# Patient Record
Sex: Female | Born: 1935 | Race: White | Hispanic: No | Smoking: Never smoker
Health system: Southern US, Community
[De-identification: ages and names within clinical notes are randomized; demographics above are authoritative.]

## PROBLEM LIST (undated history)

## (undated) DIAGNOSIS — I10 Essential (primary) hypertension: Secondary | ICD-10-CM

## (undated) DIAGNOSIS — F32A Depression, unspecified: Secondary | ICD-10-CM

## (undated) DIAGNOSIS — E079 Disorder of thyroid, unspecified: Secondary | ICD-10-CM

## (undated) DIAGNOSIS — M1990 Unspecified osteoarthritis, unspecified site: Secondary | ICD-10-CM

## (undated) DIAGNOSIS — R7303 Prediabetes: Secondary | ICD-10-CM

## (undated) DIAGNOSIS — F329 Major depressive disorder, single episode, unspecified: Secondary | ICD-10-CM

## (undated) DIAGNOSIS — F419 Anxiety disorder, unspecified: Secondary | ICD-10-CM

## (undated) DIAGNOSIS — I639 Cerebral infarction, unspecified: Secondary | ICD-10-CM

## (undated) HISTORY — PX: MULTIPLE TOOTH EXTRACTIONS: SHX2053

## (undated) HISTORY — PX: ABDOMINAL HYSTERECTOMY: SHX81

## (undated) HISTORY — PX: CHOLECYSTECTOMY: SHX55

## (undated) HISTORY — PX: ROTATOR CUFF REPAIR: SHX139

---
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
## 1998-05-15 ENCOUNTER — Emergency Department (HOSPITAL_COMMUNITY): Admission: EM | Admit: 1998-05-15 | Discharge: 1998-05-15 | Payer: Self-pay | Admitting: Emergency Medicine

## 1998-05-16 ENCOUNTER — Ambulatory Visit (HOSPITAL_COMMUNITY): Admission: RE | Admit: 1998-05-16 | Discharge: 1998-05-16 | Payer: Self-pay

## 1999-10-23 ENCOUNTER — Encounter: Payer: Self-pay | Admitting: General Surgery

## 1999-10-27 ENCOUNTER — Ambulatory Visit (HOSPITAL_COMMUNITY): Admission: RE | Admit: 1999-10-27 | Discharge: 1999-10-28 | Payer: Self-pay | Admitting: General Surgery

## 2003-09-06 ENCOUNTER — Emergency Department (HOSPITAL_COMMUNITY): Admission: EM | Admit: 2003-09-06 | Discharge: 2003-09-06 | Payer: Self-pay | Admitting: Emergency Medicine

## 2003-09-27 ENCOUNTER — Encounter: Admission: RE | Admit: 2003-09-27 | Discharge: 2003-09-27 | Payer: Self-pay | Admitting: Sports Medicine

## 2003-10-18 ENCOUNTER — Ambulatory Visit (HOSPITAL_COMMUNITY): Admission: RE | Admit: 2003-10-18 | Discharge: 2003-10-18 | Payer: Self-pay | Admitting: Orthopedic Surgery

## 2003-11-05 ENCOUNTER — Ambulatory Visit (HOSPITAL_COMMUNITY): Admission: RE | Admit: 2003-11-05 | Discharge: 2003-11-05 | Payer: Self-pay | Admitting: Orthopedic Surgery

## 2003-11-05 ENCOUNTER — Ambulatory Visit (HOSPITAL_BASED_OUTPATIENT_CLINIC_OR_DEPARTMENT_OTHER): Admission: RE | Admit: 2003-11-05 | Discharge: 2003-11-05 | Payer: Self-pay | Admitting: Orthopedic Surgery

## 2003-11-05 ENCOUNTER — Encounter (INDEPENDENT_AMBULATORY_CARE_PROVIDER_SITE_OTHER): Payer: Self-pay | Admitting: *Deleted

## 2004-06-10 ENCOUNTER — Other Ambulatory Visit: Admission: RE | Admit: 2004-06-10 | Discharge: 2004-06-10 | Payer: Self-pay | Admitting: Family Medicine

## 2004-07-16 ENCOUNTER — Encounter: Admission: RE | Admit: 2004-07-16 | Discharge: 2004-07-16 | Payer: Self-pay | Admitting: Family Medicine

## 2005-03-16 ENCOUNTER — Ambulatory Visit (HOSPITAL_COMMUNITY): Admission: RE | Admit: 2005-03-16 | Discharge: 2005-03-16 | Payer: Self-pay | Admitting: Gastroenterology

## 2005-10-25 ENCOUNTER — Emergency Department (HOSPITAL_COMMUNITY): Admission: EM | Admit: 2005-10-25 | Discharge: 2005-10-25 | Payer: Self-pay | Admitting: Family Medicine

## 2006-07-06 ENCOUNTER — Other Ambulatory Visit: Admission: RE | Admit: 2006-07-06 | Discharge: 2006-07-06 | Payer: Self-pay | Admitting: Family Medicine

## 2006-11-30 ENCOUNTER — Encounter: Admission: RE | Admit: 2006-11-30 | Discharge: 2006-11-30 | Payer: Self-pay | Admitting: Family Medicine

## 2007-07-21 ENCOUNTER — Other Ambulatory Visit: Admission: RE | Admit: 2007-07-21 | Discharge: 2007-07-21 | Payer: Self-pay | Admitting: Family Medicine

## 2007-08-25 ENCOUNTER — Emergency Department (HOSPITAL_COMMUNITY): Admission: EM | Admit: 2007-08-25 | Discharge: 2007-08-25 | Payer: Self-pay | Admitting: Family Medicine

## 2008-01-15 ENCOUNTER — Encounter (INDEPENDENT_AMBULATORY_CARE_PROVIDER_SITE_OTHER): Payer: Self-pay | Admitting: Family Medicine

## 2008-01-15 ENCOUNTER — Ambulatory Visit: Payer: Self-pay | Admitting: Vascular Surgery

## 2008-01-15 ENCOUNTER — Ambulatory Visit (HOSPITAL_COMMUNITY): Admission: RE | Admit: 2008-01-15 | Discharge: 2008-01-15 | Payer: Self-pay | Admitting: Family Medicine

## 2008-04-26 ENCOUNTER — Other Ambulatory Visit: Admission: RE | Admit: 2008-04-26 | Discharge: 2008-04-26 | Payer: Self-pay | Admitting: Obstetrics and Gynecology

## 2008-08-13 ENCOUNTER — Encounter (INDEPENDENT_AMBULATORY_CARE_PROVIDER_SITE_OTHER): Payer: Self-pay | Admitting: Obstetrics and Gynecology

## 2008-08-13 ENCOUNTER — Inpatient Hospital Stay (HOSPITAL_COMMUNITY): Admission: RE | Admit: 2008-08-13 | Discharge: 2008-08-14 | Payer: Self-pay | Admitting: Obstetrics and Gynecology

## 2009-09-17 ENCOUNTER — Ambulatory Visit (HOSPITAL_COMMUNITY)
Admission: RE | Admit: 2009-09-17 | Discharge: 2009-09-18 | Payer: Self-pay | Source: Home / Self Care | Admitting: Obstetrics and Gynecology

## 2010-07-12 ENCOUNTER — Encounter: Payer: Self-pay | Admitting: Family Medicine

## 2010-09-14 LAB — COMPREHENSIVE METABOLIC PANEL
ALT: 26 U/L (ref 0–35)
AST: 19 U/L (ref 0–37)
Albumin: 4 g/dL (ref 3.5–5.2)
Alkaline Phosphatase: 44 U/L (ref 39–117)
BUN: 20 mg/dL (ref 6–23)
CO2: 30 mEq/L (ref 19–32)
Calcium: 9.5 mg/dL (ref 8.4–10.5)
Chloride: 100 mEq/L (ref 96–112)
Creatinine, Ser: 0.74 mg/dL (ref 0.4–1.2)
GFR calc Af Amer: >60 mL/min (ref >60)
GFR calc non Af Amer: >60 mL/min (ref >60)
Glucose, Bld: 109 mg/dL — ABNORMAL HIGH (ref 70–99)
Potassium: 4.2 mEq/L (ref 3.5–5.1)
Sodium: 137 mEq/L (ref 135–145)
Total Bilirubin: 0.5 mg/dL (ref 0.3–1.2)
Total Protein: 6.7 g/dL (ref 6.0–8.3)

## 2010-09-14 LAB — CBC
HCT: 31.2 % — ABNORMAL LOW (ref 36.0–46.0)
HCT: 39.9 % (ref 36.0–46.0)
Hemoglobin: 10.6 g/dL — ABNORMAL LOW (ref 12.0–15.0)
Hemoglobin: 13.3 g/dL (ref 12.0–15.0)
MCHC: 33.4 g/dL (ref 30.0–36.0)
MCHC: 33.9 g/dL (ref 30.0–36.0)
MCV: 90.4 fL (ref 78.0–100.0)
MCV: 91.1 fL (ref 78.0–100.0)
Platelets: 162 10*3/uL (ref 150–400)
Platelets: 243 10*3/uL (ref 150–400)
RBC: 3.42 MIL/uL — ABNORMAL LOW (ref 3.87–5.11)
RBC: 4.42 MIL/uL (ref 3.87–5.11)
RDW: 13.8 % (ref 11.5–15.5)
RDW: 13.9 % (ref 11.5–15.5)
WBC: 6.1 10*3/uL (ref 4.0–10.5)
WBC: 6.5 10*3/uL (ref 4.0–10.5)

## 2010-10-06 LAB — COMPREHENSIVE METABOLIC PANEL
ALT: 32 U/L (ref 0–35)
AST: 23 U/L (ref 0–37)
Albumin: 3.7 g/dL (ref 3.5–5.2)
Alkaline Phosphatase: 52 U/L (ref 39–117)
BUN: 18 mg/dL (ref 6–23)
CO2: 31 mEq/L (ref 19–32)
Calcium: 9.7 mg/dL (ref 8.4–10.5)
Chloride: 100 mEq/L (ref 96–112)
Creatinine, Ser: 0.62 mg/dL (ref 0.4–1.2)
GFR calc Af Amer: >60 mL/min (ref >60)
GFR calc non Af Amer: >60 mL/min (ref >60)
Glucose, Bld: 109 mg/dL — ABNORMAL HIGH (ref 70–99)
Potassium: 4.5 mEq/L (ref 3.5–5.1)
Sodium: 140 mEq/L (ref 135–145)
Total Bilirubin: 0.4 mg/dL (ref 0.3–1.2)
Total Protein: 7 g/dL (ref 6.0–8.3)

## 2010-10-06 LAB — CBC
HCT: 30.1 % — ABNORMAL LOW (ref 36.0–46.0)
HCT: 36 % (ref 36.0–46.0)
Hemoglobin: 10.1 g/dL — ABNORMAL LOW (ref 12.0–15.0)
Hemoglobin: 12.2 g/dL (ref 12.0–15.0)
MCHC: 33.5 g/dL (ref 30.0–36.0)
MCHC: 33.7 g/dL (ref 30.0–36.0)
MCV: 87.9 fL (ref 78.0–100.0)
MCV: 89 fL (ref 78.0–100.0)
Platelets: 227 10*3/uL (ref 150–400)
Platelets: 256 10*3/uL (ref 150–400)
RBC: 3.38 MIL/uL — ABNORMAL LOW (ref 3.87–5.11)
RBC: 4.1 MIL/uL (ref 3.87–5.11)
RDW: 13.6 % (ref 11.5–15.5)
RDW: 13.9 % (ref 11.5–15.5)
WBC: 5.2 10*3/uL (ref 4.0–10.5)
WBC: 8.1 10*3/uL (ref 4.0–10.5)

## 2010-10-06 LAB — ABO/RH: ABO/RH(D): A POS

## 2010-10-06 LAB — TYPE AND SCREEN
ABO/RH(D): A POS
Antibody Screen: NEGATIVE

## 2010-11-03 NOTE — H&P (Signed)
[name], ROSKELLEY        ACCOUNT NO.:  [id number]   MEDICAL RECORD NO.:  [id number]          PATIENT TYPE:  AMB   LOCATION:  SDC                           [facility]ACILITY:  [facility]   PHYSICIAN:  [name], MDDATE OF BIRTH:  [birth year]   DATE OF ADMISSION:  DATE OF DISCHARGE:                              HISTORY & PHYSICAL   A 75 year old gravida 3, para 3, originally referred from Dr. Shaune [name], [name] at [facility] with worsening cystocele and vaginal odor.  She has to reduce the cystocele to void, but she does not feel that she  voids completely.  Therefore, I asked to go frequently.  No urge  incontinence or stress incontinence.  She has no problems, having to  reduce anything for bowel movements, but notes that the bulge in the  vagina entrance is much worse than last seen.   PAST MEDICAL HISTORY:  Hypertension and hypothyroidism.   SURGICAL HISTORY:  Rotator cuff surgery and cholecystectomy.   MEDICATIONS:  1. Synthroid 50 mcg per day.  2. Atenolol.  3. Lisinopril 10 mg a day.  4. Hydrochlorothiazide 25 mg a day.  5. Metoprolol 50 mg per day.   ALLERGIES:  No known drug allergies.   SOCIAL HISTORY:  Denies tobacco, alcohol, or drug use exposure.  She is  sexually active with her husband until 3 months ago when she became  afraid to have intercourse secondary to the bulge in the vagina.   FAMILY HISTORY:  Father age 110 with Alzheimer.  Mother deceased at 66  with hypertension.  Brother deceased at 57 with hypertension and heart  disease.   REVIEW OF SYSTEMS:  Denies fever, chills, rashes, headaches, seasonal  allergies, chest pain, shortness of breath, diarrhea, constipation,  bleeding, melena, hematochezia, urgency, frequency, dysuria,  incontinence, or hematuria.  No galactorrhea or emotional changes.   PHYSICAL EXAMINATION:  GENERAL:  Alert and oriented x3.  VITAL SIGNS:  Blood pressure 128/70, heart rate 88, and respirations 20.  HEENT:  Grossly  within normal limits.  NECK:  Supple.  No thyromegaly or adenopathy.  LUNGS:  Clear bilaterally.  ABDOMEN:  Soft, flat, and nontender.  PELVIC:  Normal external female genitalia, cystocele with Valsalva does  prolapse out of vaginal vault approximately 1 cm, and it is large on  digital examination.  On rectal exam, I cannot detect significant  rectocele, but has minor bulge with Valsalva.  Uterus, a tenaculum was  placed in the cervix, comes to within 1-2 cm of the introitus.  Uterus  was nonenlarged.  Adnexa nontender without masses or ovaries  bilaterally.   ASSESSMENT:  Cystocele 618.01, uterine prolapse 618.1, rectocele 618.04,  hypertension 401.9.   PLAN:  Admit to undergo laparoscopic-assisted bilateral salpingo-  oophorectomy.  BSO for uterine prolapse, cystocele, and rectocele.  We  will proceed with anterior and posterior repair as indicated.  Preoperative, CBC, CMET, type and screen, SCDs done on the OR, knee  high.  Preop with anesthesia  for surgery cefotetan 2 g IV.  She gives  informed consent accepts risks of infection, bleeding, bowel and bladder  damage, blood product risk including hepatitis and  HIV exposure.  She  accepts risks of urinary tract injury, bladder or bowel or ureter.  She  accepts the risks of infection, bleeding, hepatitis exposure, , HIV  exposure with blood, all questions were answered.  We will proceed as  outlined.       Carly A. Sydnee Cabal, MD  Electronically Signed     CAD/MEDQ  D:  08/01/2008  T:  08/02/2008  Job:  [id number]

## 2010-11-03 NOTE — Op Note (Signed)
NAMEBRAYDEN, [name]        ACCOUNT NO.:  [id number]   MEDICAL RECORD NO.:  [id number]          PATIENT TYPE:  OIB   LOCATION:  9320                          FACILITY:  [facility]   PHYSICIAN:  Charles A. Delcambre, MDDATE OF BIRTH:  Nov 07, 1935   DATE OF PROCEDURE:  DATE OF DISCHARGE:                               OPERATIVE REPORT   PREOPERATIVE DIAGNOSES:  1. Uterine prolapse.  2. Cystocele.   POSTOPERATIVE DIAGNOSES:  1. Uterine prolapse.  2. Cystocele.   PROCEDURE:  Laparoscopic-assisted vaginal hysterectomy, bilateral  salpingo-oophorectomy, and anterior repair.   SURGEON:  Charles A. Sydnee Cabal, MD   ASSISTANT:  Gerald Leitz, MD   COMPLICATIONS:  None.   ESTIMATED BLOOD LOSS:  100 mL.   OPERATIVE FINDINGS:  Large cystocele with uterine prolapse to the  introitus.   SPECIMEN:  Uterus, tubes, and ovaries to Pathology.   Instrument, sponge, and needle count correct x2.   DESCRIPTION OF PROCEDURE:  The patient was taken to the operating room,  placed into the supine position.  General anesthetic was induced without  difficulty.  She was then sterilely prepped and draped in dorsal  lithotomy position, and a cannula was placed on the cervix for uterine  manipulation during the case.  Attention was then turned to the abdomen.  A 1-cm incision was made at the umbilicus.  An injection of 0.25% plain  Marcaine was injected at that area of approximately 2 mL, and then  before the incision was made, Veress needle was placed by anterior  traction on the abdominal wall.  Aspiration injection, reaspiration,  hanging drop test, and insufflation pressures less than 8 mmHg at 2 L  per minute all indicated intraperitoneal location.  Pneumoperitoneum was  adequate.  At 2.5 L insufflation, Veress needle was removed.  Anterior  traction was again placed on the abdominal wall, and the trocar was  placed.  A 2-mm port was placed without difficulty.  Immediate  verification of  intraperitoneal location was made by placement of the  scope, and there was no evidence of injury to surrounding structures  under direct visualization.  The ports were placed anterior and lateral  to the central port.  These sites were injected with 0.25% plain  Marcaine, and trocars were introduced through the stab incisions under  direct visualization.  The uterus was lifted and the ovaries were seen,  the tubes were seen, and the infundibulopelvic pedicles were seen.  There were scar tissue behind the uterus, appeared filmy, was easily  lysed, but was a considerable amount, suspect possibly some surrounding  inflammatory reaction to diverticulosis perhaps.  These adhesions were  lysed dropping the sigmoid back away from the uterus.  Ureters were well  seen clear of the infundibulopelvic pedicles on either side.  Pedicles  were isolated and taken with successive bites through the pedicle,  through the mesosalpinx, through the round ligament and down through the  cardinal ligament to the mid isthmic portion of the uterus with the  [name] device.  Hemostasis was excellent.  In like fashion on the right,  the infundibulopelvic pedicle was taken, broad ligament and cardinal  ligament with excellent  hemostasis.  [name] device was used to achieve  dissection of the bladder down anteriorly across the lower uterine  segment.  At this point, hemostasis was good and attention was turned to  the operation below.  Our cannula was replaced with Lahey clamps.  A  scoring incision was made after 1% and 1:100,000 with epi and lidocaine  was injected submucosally around the cervix.  Scoring incision was made.  Anterior incision was taken down further.  Bladder pillars were cut, and  [name] was almost immediately in from the prior dissection.  Bowel was  seen through the area, and the anterior approach had been accomplished.  Posteriorly, colpotomy was made without injury to surrounding  structures.   Weighted speculum was placed.  The uterosacral ligaments  were taken bilaterally, transfixion stitched, hemostasis was good on the  sidewall on the right after taking the uterine vessels and removing the  uterus on either side were achieved.  Hemostasis was achieved with 2-0  Vicryl locking suture.  Hemostasis was again verified.  The posterior  cuff was closed with 0 Vicryl.  Attention was then turned to the  anterior repair.  Peritoneum was closed with 2-0 chromic to help with  exposure.  Bladder flaps were dissected sharply with a knife and some  with Metzenbaum scissors and blunt dissection.  Developing the bladder  flaps on either side, pubourethral stitch of 2-0 Vicryl was placed.  The  pubourethral ligament was approximated, endopelvic fascia was  approximated in several other steps and successively the cystocele  reduced.  Total of 6 supportive stitches were placed.  These were tied  successively and hemostasis of the anterior repair was good.  The cuff  was then closed with Richardson angle sutures placed behind the  uterosacral ligament sutures.  Running locking 0 Vicryl was then used to  cross the cuff and tie at the other side.  Hemostasis was excellent. A 1-  inch pack with Estrace was placed.  The laparoscopy equipment was  reapplied and introduced.  Irrigation was carried out.  All pedicles  were with excellent hemostasis.  Cuff was with excellent hemostasis.  Desufflation was allowed to occur at low-pressure hemostasis was good at  the trocar sites.  Peritoneal edge was visualized and withdrawing the  port at the umbilicus 0 Vicryl was used to close the fascia.  At this  level, 3-0 Monocryl was used to close the skin at the umbilicus and the  Dermabond was used to close the lower 2 trocar sites.  The patient was  awakened and taken to recovery having tolerated the procedure well.      Charles A. Sydnee Cabal, MD  Electronically Signed     CAD/MEDQ  D:  08/13/2008  T:   08/14/2008  Job:  [id number]

## 2010-11-06 NOTE — Op Note (Signed)
[facility]  Patient:    [name], [name]                      MRN: [id number] Proc. Date: 10/27/99 Adm. Date:  [id number] Disc. Date: [id number] Attending:  Arlis Porta CC:         Duncan Dull, M.D.             Meade Maw, M.D.                           Operative Report  PREOPERATIVE DIAGNOSIS:  Chronic calculus cholecystitis.  POSTOPERATIVE DIAGNOSIS:  Chronic calculus cholecystitis.  OPERATION PERFORMED:  Laparoscopic cholecystectomy.  SURGEON:  Adolph Pollack, M.D.  ASSISTANT:  Milus Mallick, M.D.  ANESTHESIA:  General.  INDICATIONS FOR PROCEDURE:  The patient is a 75 year old female with a severe case of biliary colic.  She was noted to have gallstones back at Thanksgiving of 1999.  It was recommended that she have an operation; however, she elected not to at that time.  She subsequently had recurrence of the pain in early April.  She has been recommended she have the operation and she is brought to the operating room for that.  Liver function tests are normal.  DESCRIPTION OF PROCEDURE:  She was placed supine on the operating table and a general anesthetic was administered.  Her abdomen was sterilely prepped and draped.  Local anesthetic was infiltrated inferior to the umbilicus and a small subumbilical incision was made.  The subcutaneous tissues was dissected bluntly down to the fascia and a 1 cm incision was made in the fascia.  A pursestring suture of 0 Vicryl suture was placed around the fascial edges. The peritoneal cavity was entered sharply and under direct vision.  A Hasson trocar was introduced into the peritoneal cavity and a pneumoperitoneum created gy insufflation of CO2 gas.  Next, the laparoscope was introduced into the abdominal cavity.  Under direct vision, an 11 mm trocar was placed through a similar sized incision in the epigastrium and two 5 mm trocars placed through similar sized incisions in  the right midadomen.  The fundus of the gallbladder was identified and grasped and retracted toward the right shoulder.  There were omental adhesions between the gallbladder body and indundibulum and the omentum.  These were taken down bluntly and with the cautery.  The infundibulum of the gallbladder was identified and retacted laterally.  Using careful blunt dissection, the cystic duct was identified, skeletonized.  It was clipped three times proximally, once distally and divided sharply.  The cystic artery was then identified and clipped twice proximally, once distally and divided.  Using electrocautery the gallbladder was dissected free from the liver bed.  After the gallbladder was removed, the liver bed was irrigated and bleeding points were identified and bleeding controlled with the cautery. Once there was no further bleeding, the gallbladder fossa was once again irrigated and no bleeding or bile leakage was noted.  The gallbladder was subsequently removed through the subumbilical port and the fascial defect closed by tightening up and tying down the pursestring suture.  The perihepatic area was again inspected and again, no bile leaking or bleeding was noted.  The irrigation fluid was evacuated.  All trocars were removed and pneumoperitoneum was released.  The skin incisions were then closed with 4-0 Monocryl subcuticular stitches followed by Steri-Strips and sterile dressings.  The patient tolerated the  procedure well without any apparent complications and she was taken to the recovery room in satisfactory condition. DD:  10/27/99 TD:  10/28/99 Job: [id number]

## 2010-11-06 NOTE — Op Note (Signed)
NAMECOLUMBIA, [name]               ACCOUNT NO.:  [id number]   MEDICAL RECORD NO.:  [id number]          PATIENT TYPE:  AMB   LOCATION:  ENDO                         FACILITY:  [facility]   PHYSICIAN:  Shirley Friar, MDDATE OF BIRTH:  [birth year]   DATE OF PROCEDURE:  03/16/2005  DATE OF DISCHARGE:                                 OPERATIVE REPORT   PROCEDURE PERFORMED:  Colonoscopy.   INDICATIONS FOR PROCEDURE:  Heme positive stool.   MEDICATIONS GIVEN:  Demerol 75 mg, Versed 6 mg.   PREOPERATIVE DIAGNOSIS:  Blood in stool.   POSTOPERATIVE DIAGNOSIS:  Internal hemorrhoids.   FINDINGS:  Rectal exam was normal with normal rectal tone and no masses  noted.  A pediatric adjustable colonoscope was inserted through a well-  prepped colon and advanced to the cecum where the ileocecal valve and  appendiceal orifice were identified.  More careful withdrawal of the  colonoscope revealed normal-appearing colonic mucosa and no abnormalities  noted.  Retroflexion revealed small internal hemorrhoids and was otherwise,  normal.   DIAGNOSIS:  Small internal hemorrhoids.   PLAN:  1.  Increased fiber and diet.  2.  Check repeat CBC and check iron profile and if iron deficient, would      recommend upper endoscopy in the near future.      Shirley Friar, MD  Electronically Signed     VCS/MEDQ  D:  03/16/2005  T:  03/17/2005  Job:  [id number]   cc:   Duncan Dull, M.D.  Fax: [phone]

## 2010-11-06 NOTE — Op Note (Signed)
NAME:  [name], [name]                         ACCOUNT NO.:  [id number]   MEDICAL RECORD NO.:  [id number]                   PATIENT TYPE:  AMB   LOCATION:  DSC                                  FACILITY:  [facility]   PHYSICIAN:  Robert A. Thurston Hole, M.D.              DATE OF BIRTH:  [birth year]   DATE OF PROCEDURE:  11/05/2003  DATE OF DISCHARGE:                                 OPERATIVE REPORT   PREOPERATIVE DIAGNOSIS:  Left shoulder rotator cuff tear with partial  glenoid labrum tear and impingement.   POSTOPERATIVE DIAGNOSIS:  Left shoulder rotator cuff tear with partial  glenoid labrum tear and impingement.   OPERATION PERFORMED:  1. Left shoulder examination under anesthesia followed by arthroscopically     assisted rotator cuff repair using Arthrex suture anchors times two.  2. Left shoulder partial labrum tear debridement.  3. Left shoulder SAD.   SURGEON:  Elana Alm. Thurston Hole, M.D.   ASSISTANT:  Julien Girt, P.A.   ANESTHESIA:  General.   OPERATIVE TIME:  One hour.   COMPLICATIONS:  None.   INDICATIONS FOR PROCEDURE:  [name] is a 75 year old woman who injured  her left shoulder approximately three months ago.  Exam and MRI documented a  rotator cuff tear who is now to undergo arthroscopy and repair.   DESCRIPTION OF PROCEDURE:  [name] is brought to the operating room on  Nov 05, 2003 after a block had been placed in the holding room by  anesthesia. She was placed on the operating table in supine position.  After  being placed under general anesthesia, initial range of motion showed  forward flexion 150, abduction 150, internal external rotation of 70  degrees.  A gentle manipulation was carried out breaking up soft adhesions  and improving forward flexion to 175, abduction to 175, internal and  external rotation of 85 degrees.  The shoulder remained stable to  ligamentous exam.  She was then placed in beach chair position and her  shoulder and arm were prepped  using sterile DuraPrep and draped using  sterile technique.  She received Ancef 1 g IV preoperatively for  prophylaxis.  Initially, the arthroscopy was performed through a posterior  arthroscopic portal.  The arthroscope with a pump attached was placed and  through an anterior portal an arthroscopic probe was placed.  On initial  inspection the articular cartilage in the glenohumeral joint was intact.  Anterior labrum partial tearing 25 to 30% which was debrided, superior  labrum biceps tendon anchor was intact.  The biceps tendon was intact.  The  inferior labrum and anterior inferior glenohumeral ligaments complex was  intact.  The posterior labrum was intact.  She had a complete tear of the  supraspinatus, a partial tear of the infraspinatus and partial tear of the  subscapularis.  This was partially debrided arthroscopically.  Subacromial  space was entered and a lateral arthroscopic portal was made.  Moderately  thickened bursitis was resected.  The rotator cuff tear was further  debrided.  Subacromial decompression was carried out removing 6 mm of the  undersurface of the anterior, anterolateral and anteromedial acromion and  the CA ligament was released.  The acromioclavicular joint was not  disturbed.  At this point two separate Arthrex suture anchors were placed in  the greater tuberosity and each of these had the sutures passed through the  supraspinatus and infraspinatus.  The most anterior portion of the rotator  cuff anterior and medial to the biceps tendon where the subscapularis  partial tear was evident could not be repaired because this tissue was so  friable that it was not repairable.  The entire posterior aspect and  superior aspect of the cuff, however, was repaired with the suture anchors.  After this was done, the shoulder could be brought through a full range of  motion with no impingement on the repair.  At this point it was felt that  all pathology had been  satisfactorily addressed.  The instruments were  removed.  The portals were closed with 3-0 nylon sutures.  Sterile dressings  and a sling applied and the patient awakened and taken to the recovery room  in stable condition.   FOLLOW UP:  [name] will be followed as an outpatient on Percocet and  Naprosyn with early physical therapy.  See back in the office in a week for  suture removal and follow-up.                                               Robert A. Thurston Hole, M.D.    RAW/MEDQ  D:  11/05/2003  T:  11/06/2003  Job:  [id number]

## 2011-07-13 ENCOUNTER — Other Ambulatory Visit: Payer: Self-pay | Admitting: Family Medicine

## 2011-07-13 DIAGNOSIS — G459 Transient cerebral ischemic attack, unspecified: Secondary | ICD-10-CM

## 2011-07-19 ENCOUNTER — Ambulatory Visit
Admission: RE | Admit: 2011-07-19 | Discharge: 2011-07-19 | Disposition: A | Discharge status: Home / Self Care | Payer: Medicare Other | Source: Ambulatory Visit | Attending: Family Medicine | Admitting: Family Medicine

## 2011-07-19 DIAGNOSIS — G459 Transient cerebral ischemic attack, unspecified: Secondary | ICD-10-CM

## 2011-07-19 MED ORDER — GADOBENATE DIMEGLUMINE 529 MG/ML IV SOLN
15.0000 mL | Freq: Once | INTRAVENOUS | Status: AC | PRN
  Administered 2011-07-19: 15 mL via INTRAVENOUS

## 2011-12-10 ENCOUNTER — Other Ambulatory Visit: Payer: Self-pay | Admitting: Family Medicine

## 2011-12-10 DIAGNOSIS — Z1231 Encounter for screening mammogram for malignant neoplasm of breast: Secondary | ICD-10-CM

## 2011-12-14 ENCOUNTER — Ambulatory Visit
Admission: RE | Admit: 2011-12-14 | Discharge: 2011-12-14 | Disposition: A | Discharge status: Home / Self Care | Payer: Medicare Other | Source: Ambulatory Visit | Attending: Family Medicine | Admitting: Family Medicine

## 2011-12-14 DIAGNOSIS — Z1231 Encounter for screening mammogram for malignant neoplasm of breast: Secondary | ICD-10-CM

## 2011-12-16 ENCOUNTER — Other Ambulatory Visit: Payer: Self-pay | Admitting: Family Medicine

## 2011-12-16 DIAGNOSIS — R928 Other abnormal and inconclusive findings on diagnostic imaging of breast: Secondary | ICD-10-CM

## 2011-12-27 ENCOUNTER — Ambulatory Visit
Admission: RE | Admit: 2011-12-27 | Discharge: 2011-12-27 | Disposition: A | Discharge status: Home / Self Care | Payer: Medicare Other | Source: Ambulatory Visit | Attending: Family Medicine | Admitting: Family Medicine

## 2011-12-27 DIAGNOSIS — R928 Other abnormal and inconclusive findings on diagnostic imaging of breast: Secondary | ICD-10-CM

## 2014-03-18 DIAGNOSIS — E039 Hypothyroidism, unspecified: Secondary | ICD-10-CM | POA: Insufficient documentation

## 2014-03-18 DIAGNOSIS — E782 Mixed hyperlipidemia: Secondary | ICD-10-CM | POA: Insufficient documentation

## 2017-03-31 ENCOUNTER — Other Ambulatory Visit: Payer: Self-pay | Admitting: Family Medicine

## 2017-03-31 DIAGNOSIS — E2839 Other primary ovarian failure: Secondary | ICD-10-CM

## 2017-03-31 DIAGNOSIS — Z1231 Encounter for screening mammogram for malignant neoplasm of breast: Secondary | ICD-10-CM

## 2017-08-09 DIAGNOSIS — Z6825 Body mass index (BMI) 25.0-25.9, adult: Secondary | ICD-10-CM | POA: Diagnosis not present

## 2017-08-09 DIAGNOSIS — I10 Essential (primary) hypertension: Secondary | ICD-10-CM | POA: Diagnosis not present

## 2017-08-09 DIAGNOSIS — E785 Hyperlipidemia, unspecified: Secondary | ICD-10-CM | POA: Diagnosis not present

## 2017-08-09 DIAGNOSIS — E039 Hypothyroidism, unspecified: Secondary | ICD-10-CM | POA: Diagnosis not present

## 2017-09-26 DIAGNOSIS — K5900 Constipation, unspecified: Secondary | ICD-10-CM | POA: Diagnosis not present

## 2017-09-26 DIAGNOSIS — E559 Vitamin D deficiency, unspecified: Secondary | ICD-10-CM | POA: Diagnosis not present

## 2017-09-26 DIAGNOSIS — E039 Hypothyroidism, unspecified: Secondary | ICD-10-CM | POA: Diagnosis not present

## 2017-09-26 DIAGNOSIS — E7800 Pure hypercholesterolemia, unspecified: Secondary | ICD-10-CM | POA: Diagnosis not present

## 2017-09-26 DIAGNOSIS — L659 Nonscarring hair loss, unspecified: Secondary | ICD-10-CM | POA: Diagnosis not present

## 2017-09-26 DIAGNOSIS — I10 Essential (primary) hypertension: Secondary | ICD-10-CM | POA: Diagnosis not present

## 2018-03-29 DIAGNOSIS — E559 Vitamin D deficiency, unspecified: Secondary | ICD-10-CM | POA: Diagnosis not present

## 2018-03-29 DIAGNOSIS — E7800 Pure hypercholesterolemia, unspecified: Secondary | ICD-10-CM | POA: Diagnosis not present

## 2018-03-29 DIAGNOSIS — I10 Essential (primary) hypertension: Secondary | ICD-10-CM | POA: Diagnosis not present

## 2018-03-29 DIAGNOSIS — H9190 Unspecified hearing loss, unspecified ear: Secondary | ICD-10-CM | POA: Diagnosis not present

## 2018-03-29 DIAGNOSIS — Z23 Encounter for immunization: Secondary | ICD-10-CM | POA: Diagnosis not present

## 2018-03-29 DIAGNOSIS — Z0000 Encounter for general adult medical examination without abnormal findings: Secondary | ICD-10-CM | POA: Diagnosis not present

## 2018-03-29 DIAGNOSIS — R413 Other amnesia: Secondary | ICD-10-CM | POA: Diagnosis not present

## 2018-03-29 DIAGNOSIS — E039 Hypothyroidism, unspecified: Secondary | ICD-10-CM | POA: Diagnosis not present

## 2019-04-20 DIAGNOSIS — Z1211 Encounter for screening for malignant neoplasm of colon: Secondary | ICD-10-CM | POA: Diagnosis not present

## 2019-04-20 DIAGNOSIS — Z0000 Encounter for general adult medical examination without abnormal findings: Secondary | ICD-10-CM | POA: Diagnosis not present

## 2019-05-04 DIAGNOSIS — R7301 Impaired fasting glucose: Secondary | ICD-10-CM | POA: Diagnosis not present

## 2019-05-04 DIAGNOSIS — E559 Vitamin D deficiency, unspecified: Secondary | ICD-10-CM | POA: Diagnosis not present

## 2019-05-04 DIAGNOSIS — E039 Hypothyroidism, unspecified: Secondary | ICD-10-CM | POA: Diagnosis not present

## 2019-05-04 DIAGNOSIS — E7800 Pure hypercholesterolemia, unspecified: Secondary | ICD-10-CM | POA: Diagnosis not present

## 2019-05-04 DIAGNOSIS — E538 Deficiency of other specified B group vitamins: Secondary | ICD-10-CM | POA: Diagnosis not present

## 2019-05-04 DIAGNOSIS — I10 Essential (primary) hypertension: Secondary | ICD-10-CM | POA: Diagnosis not present

## 2019-11-29 DIAGNOSIS — L989 Disorder of the skin and subcutaneous tissue, unspecified: Secondary | ICD-10-CM | POA: Diagnosis not present

## 2019-11-29 DIAGNOSIS — F320 Major depressive disorder, single episode, mild: Secondary | ICD-10-CM | POA: Diagnosis not present

## 2019-12-13 DIAGNOSIS — R440 Auditory hallucinations: Secondary | ICD-10-CM | POA: Diagnosis not present

## 2019-12-13 DIAGNOSIS — M160 Bilateral primary osteoarthritis of hip: Secondary | ICD-10-CM | POA: Diagnosis not present

## 2019-12-26 ENCOUNTER — Emergency Department (HOSPITAL_COMMUNITY): Payer: Medicare PPO

## 2019-12-26 ENCOUNTER — Encounter (HOSPITAL_COMMUNITY): Payer: Self-pay

## 2019-12-26 ENCOUNTER — Emergency Department (HOSPITAL_COMMUNITY)
Admission: EM | Admit: 2019-12-26 | Discharge: 2019-12-26 | Disposition: A | Discharge status: Home / Self Care | Payer: Medicare PPO | Attending: Emergency Medicine | Admitting: Emergency Medicine

## 2019-12-26 DIAGNOSIS — R440 Auditory hallucinations: Secondary | ICD-10-CM | POA: Diagnosis not present

## 2019-12-26 DIAGNOSIS — Z7982 Long term (current) use of aspirin: Secondary | ICD-10-CM | POA: Diagnosis not present

## 2019-12-26 DIAGNOSIS — I10 Essential (primary) hypertension: Secondary | ICD-10-CM | POA: Insufficient documentation

## 2019-12-26 DIAGNOSIS — R69 Illness, unspecified: Secondary | ICD-10-CM | POA: Diagnosis not present

## 2019-12-26 DIAGNOSIS — Z79899 Other long term (current) drug therapy: Secondary | ICD-10-CM | POA: Insufficient documentation

## 2019-12-26 DIAGNOSIS — Z20822 Contact with and (suspected) exposure to covid-19: Secondary | ICD-10-CM | POA: Insufficient documentation

## 2019-12-26 DIAGNOSIS — Z03818 Encounter for observation for suspected exposure to other biological agents ruled out: Secondary | ICD-10-CM | POA: Diagnosis not present

## 2019-12-26 DIAGNOSIS — R443 Hallucinations, unspecified: Secondary | ICD-10-CM | POA: Diagnosis not present

## 2019-12-26 DIAGNOSIS — G319 Degenerative disease of nervous system, unspecified: Secondary | ICD-10-CM | POA: Diagnosis not present

## 2019-12-26 DIAGNOSIS — I6782 Cerebral ischemia: Secondary | ICD-10-CM | POA: Diagnosis not present

## 2019-12-26 HISTORY — DX: Cerebral infarction, unspecified: I63.9

## 2019-12-26 HISTORY — DX: Disorder of thyroid, unspecified: E07.9

## 2019-12-26 HISTORY — DX: Essential (primary) hypertension: I10

## 2019-12-26 LAB — COMPREHENSIVE METABOLIC PANEL
ALT: 24 U/L (ref 0–44)
AST: 24 U/L (ref 15–41)
Albumin: 4.4 g/dL (ref 3.5–5.0)
Alkaline Phosphatase: 40 U/L (ref 38–126)
Anion gap: 8 (ref 5–15)
BUN: 21 mg/dL (ref 8–23)
CO2: 26 mmol/L (ref 22–32)
Calcium: 9.1 mg/dL (ref 8.9–10.3)
Chloride: 104 mmol/L (ref 98–111)
Creatinine, Ser: 0.97 mg/dL (ref 0.44–1.00)
GFR calc Af Amer: >60 mL/min (ref >60)
GFR calc non Af Amer: 54 mL/min — ABNORMAL LOW (ref >60)
Glucose, Bld: 125 mg/dL — ABNORMAL HIGH (ref 70–99)
Potassium: 4.6 mmol/L (ref 3.5–5.1)
Sodium: 138 mmol/L (ref 135–145)
Total Bilirubin: 0.8 mg/dL (ref 0.3–1.2)
Total Protein: 7.1 g/dL (ref 6.5–8.1)

## 2019-12-26 LAB — CBC WITH DIFFERENTIAL/PLATELET
Abs Immature Granulocytes: 0.01 10*3/uL (ref 0.00–0.07)
Basophils Absolute: 0.1 10*3/uL (ref 0.0–0.1)
Basophils Relative: 1 %
Eosinophils Absolute: 0.1 10*3/uL (ref 0.0–0.5)
Eosinophils Relative: 2 %
HCT: 34.7 % — ABNORMAL LOW (ref 36.0–46.0)
Hemoglobin: 11.2 g/dL — ABNORMAL LOW (ref 12.0–15.0)
Immature Granulocytes: 0 %
Lymphocytes Relative: 23 %
Lymphs Abs: 1.3 10*3/uL (ref 0.7–4.0)
MCH: 30.1 pg (ref 26.0–34.0)
MCHC: 32.3 g/dL (ref 30.0–36.0)
MCV: 93.3 fL (ref 80.0–100.0)
Monocytes Absolute: 0.5 10*3/uL (ref 0.1–1.0)
Monocytes Relative: 9 %
Neutro Abs: 3.8 10*3/uL (ref 1.7–7.7)
Neutrophils Relative %: 65 %
Platelets: 172 10*3/uL (ref 150–400)
RBC: 3.72 MIL/uL — ABNORMAL LOW (ref 3.87–5.11)
RDW: 13.1 % (ref 11.5–15.5)
WBC: 5.8 10*3/uL (ref 4.0–10.5)
nRBC: 0 % (ref 0.0–0.2)

## 2019-12-26 LAB — URINALYSIS, ROUTINE W REFLEX MICROSCOPIC
Bilirubin Urine: NEGATIVE
Glucose, UA: NEGATIVE mg/dL
Hgb urine dipstick: NEGATIVE
Ketones, ur: NEGATIVE mg/dL
Leukocytes,Ua: NEGATIVE
Nitrite: NEGATIVE
Protein, ur: NEGATIVE mg/dL
Specific Gravity, Urine: 1.01 (ref 1.005–1.030)
pH: 5.5 (ref 5.0–8.0)

## 2019-12-26 LAB — SARS CORONAVIRUS 2 BY RT PCR (HOSPITAL ORDER, PERFORMED IN ~~LOC~~ HOSPITAL LAB): SARS Coronavirus 2: NEGATIVE

## 2019-12-26 NOTE — ED Provider Notes (Signed)
[facility]-EMERGENCY DEPT Provider Note   CSN: [id number] Arrival date & time: 12/26/19  0031     History Chief Complaint  Patient presents with  . Hallucinations    [name] is a 84 y.o. female.  The history is provided by the patient and a relative.  Illness Location:  At home  Quality:  Auditory hallucinations  Severity:  Moderate Onset quality:  Gradual Duration:  4 months Timing:  Intermittent Progression:  Worsening Chronicity:  Chronic Context:  An elderly patient  Relieved by:  Nothing Worsened by:  Nothing tried  Ineffective treatments:  None tried  Associated symptoms: no abdominal pain, no congestion, no cough, no diarrhea, no fever, no nausea, no rash, no shortness of breath and no wheezing   Risk factors:  Elderly patient  Patient presents with reports of auditory hallucinations for at least 4 months. She can hear her grandchildren.  She is not aggressive nor is she combative but symptoms have worsened in the past week. She has seen her PMD for this at week or so ago who referred her to Dr. Karel Jarvis of neurology yet the patient has not been able to be seen at this time.  The daughter is frustrated that she does not have a diagnosis or treatment.      Past Medical History:  Diagnosis Date  . Hypertension   . Stroke (HCC)   . Thyroid disease     There are no problems to display for this patient.   History reviewed. No pertinent surgical history.   OB History   No obstetric history on file.     History reviewed. No pertinent family history.  Social History   Tobacco Use  . Smoking status: Not on file  Substance Use Topics  . Alcohol use: Not on file  . Drug use: Not on file    Home Medications Prior to Admission medications   Medication Sig Start Date End Date Taking? Authorizing Provider  acetaminophen (TYLENOL) 500 MG tablet Take 500 mg by mouth every 6 (six) hours as needed for mild pain, moderate pain or headache.    Yes [provider]  aspirin EC 81 MG tablet Take 81 mg by mouth daily. Swallow whole.   Yes [provider]  levothyroxine (SYNTHROID) 50 MCG tablet Take 50 mcg by mouth daily before breakfast.   Yes [provider]  lisinopril (ZESTRIL) 5 MG tablet Take 5 mg by mouth daily.   Yes [provider]  simvastatin (ZOCOR) 20 MG tablet Take 20 mg by mouth daily.   Yes [provider]    Allergies    Patient has no known allergies.  Review of Systems   Review of Systems  Unable to perform ROS: Other (suspected dementia)  Constitutional: Negative for fever.  HENT: Negative for congestion.   Respiratory: Negative for cough, shortness of breath and wheezing.   Gastrointestinal: Negative for abdominal pain, diarrhea and nausea.  Musculoskeletal: Negative for gait problem.  Skin: Negative for rash.  Neurological: Negative for seizures.  Psychiatric/Behavioral: Positive for confusion and hallucinations.  All other systems reviewed and are negative.   Physical Exam Updated Vital Signs BP (!) 157/75 (BP Location: Right Arm)   Pulse 71   Temp 98.2 F (36.8 C) (Oral)   Resp 17   Ht 5\' 6"  (1.676 m)   Wt 60.8 kg   SpO2 94%   BMI 21.63 kg/m   Physical Exam Vitals and nursing note reviewed.  Constitutional:  General: She is not in acute distress.    Appearance: Normal appearance.  HENT:     Head: Normocephalic and atraumatic.     Nose: Nose normal.  Eyes:     Extraocular Movements: Extraocular movements intact.     Conjunctiva/sclera: Conjunctivae normal.     Pupils: Pupils are equal, round, and reactive to light.  Cardiovascular:     Rate and Rhythm: Normal rate and regular rhythm.     Pulses: Normal pulses.     Heart sounds: Normal heart sounds.  Pulmonary:     Effort: Pulmonary effort is normal.     Breath sounds: Normal breath sounds.  Abdominal:     General: Abdomen is flat. Bowel sounds are normal.     Palpations: Abdomen  is soft.     Tenderness: There is no abdominal tenderness. There is no guarding or rebound.  Musculoskeletal:        General: Normal range of motion.     Cervical back: Normal range of motion and neck supple.  Skin:    General: Skin is warm and dry.     Capillary Refill: Capillary refill takes less than 2 seconds.  Neurological:     General: No focal deficit present.     Mental Status: She is alert.     Deep Tendon Reflexes: Reflexes normal.  Psychiatric:        Mood and Affect: Mood normal.        Behavior: Behavior normal.     Comments: Oriented to person and situation and knows she is at a hospital      ED Results / Procedures / Treatments   Labs (all labs ordered are listed, but only abnormal results are displayed) Results for orders placed or performed during the hospital encounter of 12/26/19  SARS Coronavirus 2 by RT PCR (hospital order, performed in [facility] hospital lab) Nasopharyngeal Nasopharyngeal Swab   Specimen: Nasopharyngeal Swab  Result Value Ref Range   SARS Coronavirus 2 NEGATIVE NEGATIVE  Comprehensive metabolic panel  Result Value Ref Range   Sodium 138 135 - 145 mmol/L   Potassium 4.6 3.5 - 5.1 mmol/L   Chloride 104 98 - 111 mmol/L   CO2 26 22 - 32 mmol/L   Glucose, Bld 125 (H) 70 - 99 mg/dL   BUN 21 8 - 23 mg/dL   Creatinine, Ser 4.19 0.44 - 1.00 mg/dL   Calcium 9.1 8.9 - 37.9 mg/dL   Total Protein 7.1 6.5 - 8.1 g/dL   Albumin 4.4 3.5 - 5.0 g/dL   AST 24 15 - 41 U/L   ALT 24 0 - 44 U/L   Alkaline Phosphatase 40 38 - 126 U/L   Total Bilirubin 0.8 0.3 - 1.2 mg/dL   GFR calc non Af Amer 54 (L) >60 mL/min   GFR calc Af Amer >60 >60 mL/min   Anion gap 8 5 - 15  CBC with Differential  Result Value Ref Range   WBC 5.8 4.0 - 10.5 K/uL   RBC 3.72 (L) 3.87 - 5.11 MIL/uL   Hemoglobin 11.2 (L) 12.0 - 15.0 g/dL   HCT 02.4 (L) 36 - 46 %   MCV 93.3 80.0 - 100.0 fL   MCH 30.1 26.0 - 34.0 pg   MCHC 32.3 30.0 - 36.0 g/dL   RDW 09.7 35.3 - 29.9 %    Platelets 172 150 - 400 K/uL   nRBC 0.0 0.0 - 0.2 %   Neutrophils Relative % 65 %  Neutro Abs 3.8 1.7 - 7.7 K/uL   Lymphocytes Relative 23 %   Lymphs Abs 1.3 0.7 - 4.0 K/uL   Monocytes Relative 9 %   Monocytes Absolute 0.5 0 - 1 K/uL   Eosinophils Relative 2 %   Eosinophils Absolute 0.1 0 - 0 K/uL   Basophils Relative 1 %   Basophils Absolute 0.1 0 - 0 K/uL   Immature Granulocytes 0 %   Abs Immature Granulocytes 0.01 0.00 - 0.07 K/uL  Urinalysis, Routine w reflex microscopic  Result Value Ref Range   Color, Urine YELLOW YELLOW   APPearance CLEAR CLEAR   Specific Gravity, Urine 1.010 1.005 - 1.030   pH 5.5 5.0 - 8.0   Glucose, UA NEGATIVE NEGATIVE mg/dL   Hgb urine dipstick NEGATIVE NEGATIVE   Bilirubin Urine NEGATIVE NEGATIVE   Ketones, ur NEGATIVE NEGATIVE mg/dL   Protein, ur NEGATIVE NEGATIVE mg/dL   Nitrite NEGATIVE NEGATIVE   Leukocytes,Ua NEGATIVE NEGATIVE   CT Head Wo Contrast  Result Date: 12/26/2019 CLINICAL DATA:  Hallucinations which are worsening EXAM: CT HEAD WITHOUT CONTRAST TECHNIQUE: Contiguous axial images were obtained from the base of the skull through the vertex without intravenous contrast. COMPARISON:  07/19/2011 FINDINGS: Brain: No evidence of acute infarction, hemorrhage, hydrocephalus, extra-axial collection or mass lesion/mass effect. Cerebral and cerebellar atrophy without specific pattern. Chronic small vessel ischemia which is moderately extensive in the white matter. Vascular: No hyperdense vessel or unexpected calcification. Skull: Normal. Negative for fracture or focal lesion. Sinuses/Orbits: No acute finding. IMPRESSION: Senescent changes without acute or reversible finding. Electronically Signed   By: Marnee SpringJonathon  Watts M.D.   On: 12/26/2019 05:17   DG Chest Portable 1 View  Result Date: 12/26/2019 CLINICAL DATA:  Hallucination EXAM: PORTABLE CHEST 1 VIEW COMPARISON:  10/23/1999 report FINDINGS: The heart size and mediastinal contours are within normal  limits. Hyperinflated appearance which was also noted on prior. Both lungs are clear. The visualized skeletal structures are unremarkable. IMPRESSION: No active disease. Electronically Signed   By: Marnee SpringJonathon  Watts M.D.   On: 12/26/2019 04:14    Radiology CT Head Wo Contrast  Result Date: 12/26/2019 CLINICAL DATA:  Hallucinations which are worsening EXAM: CT HEAD WITHOUT CONTRAST TECHNIQUE: Contiguous axial images were obtained from the base of the skull through the vertex without intravenous contrast. COMPARISON:  07/19/2011 FINDINGS: Brain: No evidence of acute infarction, hemorrhage, hydrocephalus, extra-axial collection or mass lesion/mass effect. Cerebral and cerebellar atrophy without specific pattern. Chronic small vessel ischemia which is moderately extensive in the white matter. Vascular: No hyperdense vessel or unexpected calcification. Skull: Normal. Negative for fracture or focal lesion. Sinuses/Orbits: No acute finding. IMPRESSION: Senescent changes without acute or reversible finding. Electronically Signed   By: Marnee SpringJonathon  Watts M.D.   On: 12/26/2019 05:17   DG Chest Portable 1 View  Result Date: 12/26/2019 CLINICAL DATA:  Hallucination EXAM: PORTABLE CHEST 1 VIEW COMPARISON:  10/23/1999 report FINDINGS: The heart size and mediastinal contours are within normal limits. Hyperinflated appearance which was also noted on prior. Both lungs are clear. The visualized skeletal structures are unremarkable. IMPRESSION: No active disease. Electronically Signed   By: Marnee SpringJonathon  Watts M.D.   On: 12/26/2019 04:14    Procedures Procedures (including critical care time)  Medications Ordered in ED Medications - No data to display  ED Course  I have reviewed the triage vital signs and the nursing notes.  Pertinent labs & imaging results that were available during my care of the patient were reviewed by me  and considered in my medical decision making (see chart for details).    The metabolic work up  is completely negative.  I do believe this is dementia.  The patient is completely calm, smiling, sweet and pleasant. She is well appearing with normal exam and vitals. There are no indication for medical admission at this time.  No signs of infection.  I do not believe this is delirium but likely hallucinations from undiagnosed dementia. The daughter is frustrated that we cannot make the diagnosis in the ED.  EDP explained that the diagnosis needs to be made based on specialized testing.  I have offered to have the patient observed for psychiatry to see the in am to see if they have additional recommendations but the patient's daughter declines this as well. I asked again, what the ED could do for the patient and the daughter repeated she needs a diagnosis and I again apologized for their inconvenience.  The patient is happy to be going home and remains very pleasant.    EDP states I would send an email to Dr. Karel Jarvis to attempt to get the patient an appointment. I have sent an Epic email to Dr. Karel Jarvis in hopes of getting the patient an appointment for the additional specialized testing she needs.    LILIAN FUHS was evaluated in Emergency Department on 12/26/2019 for the symptoms described in the history of present illness. She was evaluated in the context of the global COVID-19 pandemic, which necessitated consideration that the patient might be at risk for infection with the SARS-CoV-2 virus that causes COVID-19. Institutional protocols and algorithms that pertain to the evaluation of patients at risk for COVID-19 are in a state of rapid change based on information released by regulatory bodies including the CDC and federal and state organizations. These policies and algorithms were followed during the patient's care in the ED.  Final Clinical Impression(s) / ED Diagnoses Final diagnoses:  Auditory hallucination    Return for intractable cough, coughing up blood,fevers >100.4 unrelieved by  medication, shortness of breath, intractable vomiting, chest pain, shortness of breath, weakness,numbness, changes in speech, facial asymmetry,abdominal pain, passing out,Inability to tolerate liquids or food, cough, altered mental status or any concerns. No signs of systemic illness or infection. The patient is nontoxic-appearing on exam and vital signs are within normal limits.   I have reviewed the triage vital signs and the nursing notes. Pertinent labs &imaging results that were available during my care of the patient were reviewed by me and considered in my medical decision making (see chart for details).After history, exam, and medical workup I feel the patient has beenappropriately medically screened and is safe for discharge home. Pertinent diagnoses were discussed with the patient. Patient was given return precautions.    Vadhir Mcnay, MD 12/26/19 [phone]

## 2019-12-26 NOTE — ED Triage Notes (Signed)
Patient arrived with family who states over the last few days the patient has had visual and auditory hallucinations. Family states this has been happening for a while now, but increased over the last two days. Provider told to come to the ED for labs.

## 2019-12-27 ENCOUNTER — Encounter: Payer: Self-pay | Admitting: Neurology

## 2020-01-01 ENCOUNTER — Encounter: Payer: Self-pay | Admitting: Neurology

## 2020-01-01 ENCOUNTER — Other Ambulatory Visit: Payer: Self-pay

## 2020-01-01 ENCOUNTER — Ambulatory Visit: Payer: Medicare PPO | Admitting: Neurology

## 2020-01-01 VITALS — BP 161/76 | HR 69 | Ht 66.0 in | Wt 141.0 lb

## 2020-01-01 DIAGNOSIS — R443 Hallucinations, unspecified: Secondary | ICD-10-CM

## 2020-01-01 DIAGNOSIS — F03A0 Unspecified dementia, mild, without behavioral disturbance, psychotic disturbance, mood disturbance, and anxiety: Secondary | ICD-10-CM

## 2020-01-01 DIAGNOSIS — F0390 Unspecified dementia without behavioral disturbance: Secondary | ICD-10-CM

## 2020-01-01 MED ORDER — DONEPEZIL HCL 10 MG PO TABS: ORAL_TABLET | ORAL | 11 refills | Status: DC

## 2020-01-01 NOTE — Patient Instructions (Addendum)
1. Schedule MRI brain with and without contrast  2. Start Donepezil 10mg : Take 1/2 tablet daily for 2 weeks, then increase to 1 tablet daily  3. May take the Tylenol PM, would try 1 tablet every night  4. Call our office for an update in 2 months, if no changes, we will plan to add on Seroquel  5. Continue close supervision  6. Follow-up in 3-4 months  FALL PRECAUTIONS: Be cautious when walking. Scan the area for obstacles that may increase the risk of trips and falls. When getting up in the mornings, sit up at the edge of the bed for a few minutes before getting out of bed. Consider elevating the bed at the head end to avoid drop of blood pressure when getting up. Walk always in a well-lit room (use night lights in the walls). Avoid area rugs or power cords from appliances in the middle of the walkways. Use a walker or a cane if necessary and consider physical therapy for balance exercise. Get your eyesight checked regularly.  FINANCIAL OVERSIGHT: Supervision, especially oversight when making financial decisions or transactions is also recommended.  HOME SAFETY: Consider the safety of the kitchen when operating appliances like stoves, microwave oven, and blender. Consider having supervision and share cooking responsibilities until no longer able to participate in those. Accidents with firearms and other hazards in the house should be identified and addressed as well.  DRIVING: Regarding driving, in patients with progressive memory problems, driving will be impaired. We advise to have someone else do the driving if trouble finding directions or if minor accidents are reported. Independent driving assessment is available to determine safety of driving.  ABILITY TO BE LEFT ALONE: If patient is unable to contact 911 operator, consider using LifeLine, or when the need is there, arrange for someone to stay with patients. Smoking is a fire hazard, consider supervision or cessation. Risk of wandering  should be assessed by caregiver and if detected at any point, supervision and safe proof recommendations should be instituted.  MEDICATION SUPERVISION: Inability to self-administer medication needs to be constantly addressed. Implement a mechanism to ensure safe administration of the medications.  RECOMMENDATIONS FOR ALL PATIENTS WITH MEMORY PROBLEMS: 1. Continue to exercise (Recommend 30 minutes of walking everyday, or 3 hours every week) 2. Increase social interactions - continue going to [place] and enjoy social gatherings with friends and family 3. Eat healthy, avoid fried foods and eat more fruits and vegetables 4. Maintain adequate blood pressure, blood sugar, and blood cholesterol level. Reducing the risk of stroke and cardiovascular disease also helps promoting better memory. 5. Avoid stressful situations. Live a simple life and avoid aggravations. Organize your time and prepare for the next day in anticipation. 6. Sleep well, avoid any interruptions of sleep and avoid any distractions in the bedroom that may interfere with adequate sleep quality 7. Avoid sugar, avoid sweets as there is a strong link between excessive sugar intake, diabetes, and cognitive impairment The Mediterranean diet has been shown to help patients reduce the risk of progressive memory disorders and reduces cardiovascular risk. This includes eating fish, eat fruits and green leafy vegetables, nuts like almonds and hazelnuts, walnuts, and also use olive oil. Avoid fast foods and fried foods as much as possible. Avoid sweets and sugar as sugar use has been linked to worsening of memory function.  There is always a concern of gradual progression of memory problems. If this is the case, then we may need to adjust level of care  according to patient needs. Support, both to the patient and caregiver, should then be put into place.

## 2020-01-01 NOTE — Progress Notes (Signed)
NEUROLOGY CONSULTATION NOTE  [name] MRN: [id number] DOB: 02/24/1936  Referring provider: Dr. [name] Primary care provider: Dr. [name]  Reason for consult:  hallucinations  Dear Dr Hyman Hopes:  Thank you for your kind referral of [name] for consultation of the above symptoms. Although her history is well known to you, please allow me to reiterate it for the purpose of our medical record. The patient was accompanied to the clinic by her granddaughter [name] who also provides collateral information. Records and images were personally reviewed where available.   HISTORY OF PRESENT ILLNESS: This is a pleasant 84 year old right-handed woman with a history of hypertension, hyperlipidemia, prior stroke with no residual deficits, presenting for evaluation of auditory hallucinations and memory loss. She states her memory "comes and goes." She lives with her granddaughter [name] and daughter-in-law [name]. [name] started noticing memory changes the last couple of years where she would be repeating herself. Recently, memory has worsened, she had 2 appointments in one day and did not remember the afternoon appointment. They have had to write down notes more. She manages her own medications and [name] has not noticed any issues with forgetting medications. She used to live with her husband then he passed away and she lived alone in [place] for 9 months, before she moved in with [place] 2 years ago. She was not missing any bill payments while alone, bills were switched to autopay 2 years ago. She continues to drive around [place] and denies getting lost. She was getting lost in [place], but they feel it was due to being in an unfamiliar place. [name] started noticing auditory hallucinations while she was still living alone, she would say something was not right with the air, or the doors, so [name] moved her. She was saying the she would hear cars at night or someone trying to break in,  barricading her door. She would say some boys were drinking beside her condo or she had mice in the condo and put traps out but never caught any. Since moving in with [name] 2 years ago, she continued to have hallucinations, mostly at night initially, saying something was in the bed with her. Hallucinations have progressively worsened the past few months, also occurring in the daytime. She does not remember what she hears at night, [name] would remind her that she tells [name] she hears her granddaughter calling her "[name]" at night or during the day when the granddaughter is not home. She denies any visual hallucinations, however [name] reminds her the other day she thought she saw someone getting in the window, she heard things outside and said they were packing things into delivery trucks. She saw arms lifting things up, loading trucks up. She saw a shadow come back the other day on the 2nd level window. She would be afraid he would get a hold of the family.  She would be up all night, knocking on their doors telling them about the hallucinations. Melatonin did not help. Family brought her to the ER a week ago, CBC, CMP, urinalysis unremarkable except for anemia. I personally reviewed head CT without contrast which did not show any acute changes, there was diffuse cerebral and cerebellar atrophy, moderately extensive chronic microvascular disease. They were instructed to give Tylenol PM to help her sleep at night, she has been taking 2 tabs qhs and has been sleeping all night with this since then. No REM behavior disorder noted. [name] denies any prior psychiatric history. No hygiene  concerns, she is able to bathe and dress independently. Her mother and father had Alzheimer's disease. No history of significant head injuries. She has not had any alcohol in a while.   She denies any headaches, dizziness, diplopia, dysarthria/dysphagia, neck/back pain, focal numbness/tingling/weakness, bowel/bladder dysfunction,  anosmia, or tremors. Mood comes and goes, "but not bad." Her last fall was in October 2020 when she tripped. She is scheduled for left hip replacement on 01/28/20.    PAST MEDICAL HISTORY: Past Medical History:  Diagnosis Date  . Hypertension   . Stroke (HCC)   . Thyroid disease     PAST SURGICAL HISTORY: History reviewed. No pertinent surgical history.  MEDICATIONS: Current Outpatient Medications on File Prior to Visit  Medication Sig Dispense Refill  . acetaminophen (TYLENOL) 500 MG tablet Take 500 mg by mouth every 6 (six) hours as needed for mild pain, moderate pain or headache.    Marland Kitchen aspirin EC 81 MG tablet Take 81 mg by mouth daily. Swallow whole.    . levothyroxine (SYNTHROID) 50 MCG tablet Take 50 mcg by mouth daily before breakfast.    . lisinopril (ZESTRIL) 5 MG tablet Take 5 mg by mouth daily.    . simvastatin (ZOCOR) 20 MG tablet Take 20 mg by mouth daily.     No current facility-administered medications on file prior to visit.    ALLERGIES: No Known Allergies  FAMILY HISTORY: No family history on file.  SOCIAL HISTORY: Social History   Socioeconomic History  . Marital status: Widowed    Spouse name: Not on file  . Number of children: Not on file  . Years of education: Not on file  . Highest education level: Not on file  Occupational History  . Not on file  Tobacco Use  . Smoking status: Not on file  Substance and Sexual Activity  . Alcohol use: Not on file  . Drug use: Not on file  . Sexual activity: Not on file  Other Topics Concern  . Not on file  Social History Narrative  . Not on file   Social Determinants of Health   Financial Resource Strain:   . Difficulty of Paying Living Expenses:   Food Insecurity:   . Worried About Programme researcher, broadcasting/film/video in the Last Year:   . Barista in the Last Year:   Transportation Needs:   . Freight forwarder (Medical):   Marland Kitchen Lack of Transportation (Non-Medical):   Physical Activity:   . Days of  Exercise per Week:   . Minutes of Exercise per Session:   Stress:   . Feeling of Stress :   Social Connections:   . Frequency of Communication with Friends and Family:   . Frequency of Social Gatherings with Friends and Family:   . Attends Religious Services:   . Active Member of Clubs or Organizations:   . Attends Banker Meetings:   Marland Kitchen Marital Status:   Intimate Partner Violence:   . Fear of Current or Ex-Partner:   . Emotionally Abused:   Marland Kitchen Physically Abused:   . Sexually Abused:     PHYSICAL EXAM: Vitals:   01/01/20 1259  BP: (!) 161/76  Pulse: 69  SpO2: 95%   General: No acute distress Head:  Normocephalic/atraumatic Skin/Extremities: No rash, no edema Neurological Exam: Mental status: alert and oriented to person, state. No dysarthria or aphasia, Fund of knowledge is reduced.  Recent and remote memory are impaired.  Attention and concentration are reduced. SLUMS score  4/30 [employer] Mental Exam 01/01/2020  Weekday Correct 0  Current year 0  What state are we in? 1  Amount spent 0  Amount left 0  # of Animals 1  5 objects recall 0  Number series 0  Hour markers 0  Time correct 0  Placed X in triangle correctly 1  Largest Figure 1  Name of female 0  Date back to work 0  Type of work 0  State she lived in 0  Total score 4   Cranial nerves: CN I: not tested CN II: pupils equal, round and reactive to light, visual fields intact CN III, IV, VI:  full range of motion, no nystagmus, no ptosis CN V: facial sensation intact CN VII: upper and lower face symmetric CN VIII: hearing intact to conversation Bulk & Tone: normal, no fasciculations, no cogwheeling Motor: 5/5 throughout with no pronator drift. Sensation: intact to light touch, cold, pin, vibration and joint position sense.  No extinction to double simultaneous stimulation.  Romberg test negative Deep Tendon Reflexes: +1 throughout Cerebellar: no incoordination on finger to nose  testing Gait: slow and cautious reporting left hip pain Tremor: none   IMPRESSION: This is a pleasant 84 year old right-handed woman with a history of hypertension, hyperlipidemia, prior stroke with no residual deficits, presenting for evaluation of auditory hallucinations and memory loss for the past 2-3 years. Family reports memory changes preceded the auditory hallucinations. SLUMS score today 4/30, her neurological exam is non-focal, no parkinsonian signs seen. Symptoms like due to Alzheimer's disease with behavioral disturbance. MRI brain with and without contrast will be ordered to assess for underlying structural abnormality and assess vascular load. We discussed starting Donepezil, including side effects and expectations. This may help with the neurobehavioral changes, but I discussed that it is not a sleep aid. I also discussed that anticholinergics can worsen cognition, she has been taking Tylenol PM 2 tabs qhs which has helped her sleep through the night. Family will try to reduce to 1 tab qhs. We may need to add on Seroquel in the future. Continue close supervision. Follow-up in 3-4 months, they know to call for any changes.   Thank you for allowing me to participate in the care of this patient. Please do not hesitate to call for any questions or concerns.   Patrcia Dolly, M.D.  CC: Dr. Hyman Hopes

## 2020-01-02 ENCOUNTER — Telehealth: Payer: Self-pay | Admitting: Neurology

## 2020-01-02 NOTE — Telephone Encounter (Signed)
Spoke with pts grandaughter and told her, per Dr Karel Jarvis, to hold the Aricept tonight and see if just taking the 2 tabs of Tylenol PM helps like before. She verbalized understanding and will call with update as needed.

## 2020-01-02 NOTE — Telephone Encounter (Signed)
Pt c/o: worsening dementia/more confused New medications?  Yes.   When did changes start?  Started donepezil last night, took 1/2 tablet along with total of 2 Tylenol PM Sleeping okay? No. Has patient been checked for infection, including UTI?  No. Danger to themselves or others? No. Current medications prescribed by Dr. Karel Jarvis: Pt was started on donepezil last PM, she has been taking Tylenol PM 2 tabs at HS for hip pain/sleep, took 1 tab then wasn't sleepy so took a 2nd tab. Caller states that pt hasn't been asleep at all, was very aggressive, inappropriate conversation, screaming, threatening, hallucinating, combative, much more extreme behavior than she's had before. Told her I'd give update to Dr Karel Jarvis and return call with updates, she verbalized understanding.

## 2020-01-02 NOTE — Telephone Encounter (Signed)
Patient's daughter [name] called in. The patient began taking the new medication Dr. Karel Jarvis prescribed last night around 9:30 PM while also taking 2 Tylenol PM. Around 10:00 PM she was yelling, screaming, and banging on the doors. She still has not gone to sleep.

## 2020-01-02 NOTE — Telephone Encounter (Signed)
Pls have her hold the Aricept tonight and see if just taking the 2 tabs of Tylenol PM helps like before.

## 2020-01-03 ENCOUNTER — Other Ambulatory Visit: Payer: Self-pay | Admitting: Orthopedic Surgery

## 2020-01-04 ENCOUNTER — Telehealth: Payer: Self-pay | Admitting: Neurology

## 2020-01-04 ENCOUNTER — Emergency Department (HOSPITAL_COMMUNITY)
Admission: EM | Admit: 2020-01-04 | Discharge: 2020-01-08 | Disposition: A | Discharge status: Other Acute Inpatient Hospital | Payer: Medicare PPO | Attending: Emergency Medicine | Admitting: Emergency Medicine

## 2020-01-04 ENCOUNTER — Encounter (HOSPITAL_COMMUNITY): Payer: Self-pay | Admitting: Emergency Medicine

## 2020-01-04 ENCOUNTER — Other Ambulatory Visit: Payer: Self-pay

## 2020-01-04 ENCOUNTER — Telehealth: Payer: Self-pay

## 2020-01-04 DIAGNOSIS — Z20822 Contact with and (suspected) exposure to covid-19: Secondary | ICD-10-CM | POA: Insufficient documentation

## 2020-01-04 DIAGNOSIS — R440 Auditory hallucinations: Secondary | ICD-10-CM | POA: Diagnosis not present

## 2020-01-04 DIAGNOSIS — R4689 Other symptoms and signs involving appearance and behavior: Secondary | ICD-10-CM | POA: Diagnosis not present

## 2020-01-04 DIAGNOSIS — I10 Essential (primary) hypertension: Secondary | ICD-10-CM | POA: Diagnosis not present

## 2020-01-04 DIAGNOSIS — R451 Restlessness and agitation: Secondary | ICD-10-CM | POA: Insufficient documentation

## 2020-01-04 DIAGNOSIS — Z03818 Encounter for observation for suspected exposure to other biological agents ruled out: Secondary | ICD-10-CM | POA: Diagnosis not present

## 2020-01-04 DIAGNOSIS — R9431 Abnormal electrocardiogram [ECG] [EKG]: Secondary | ICD-10-CM | POA: Diagnosis not present

## 2020-01-04 DIAGNOSIS — R0602 Shortness of breath: Secondary | ICD-10-CM | POA: Diagnosis not present

## 2020-01-04 DIAGNOSIS — Z7982 Long term (current) use of aspirin: Secondary | ICD-10-CM | POA: Diagnosis not present

## 2020-01-04 DIAGNOSIS — Z8673 Personal history of transient ischemic attack (TIA), and cerebral infarction without residual deficits: Secondary | ICD-10-CM | POA: Diagnosis not present

## 2020-01-04 DIAGNOSIS — F22 Delusional disorders: Secondary | ICD-10-CM | POA: Diagnosis present

## 2020-01-04 DIAGNOSIS — R456 Violent behavior: Secondary | ICD-10-CM | POA: Diagnosis not present

## 2020-01-04 DIAGNOSIS — Z79899 Other long term (current) drug therapy: Secondary | ICD-10-CM | POA: Insufficient documentation

## 2020-01-04 DIAGNOSIS — R410 Disorientation, unspecified: Secondary | ICD-10-CM | POA: Insufficient documentation

## 2020-01-04 DIAGNOSIS — R443 Hallucinations, unspecified: Secondary | ICD-10-CM

## 2020-01-04 DIAGNOSIS — R109 Unspecified abdominal pain: Secondary | ICD-10-CM | POA: Diagnosis not present

## 2020-01-04 DIAGNOSIS — R4182 Altered mental status, unspecified: Secondary | ICD-10-CM | POA: Diagnosis not present

## 2020-01-04 DIAGNOSIS — G319 Degenerative disease of nervous system, unspecified: Secondary | ICD-10-CM | POA: Diagnosis not present

## 2020-01-04 DIAGNOSIS — I708 Atherosclerosis of other arteries: Secondary | ICD-10-CM | POA: Diagnosis not present

## 2020-01-04 LAB — CBC
HCT: 35.9 % — ABNORMAL LOW (ref 36.0–46.0)
Hemoglobin: 11.4 g/dL — ABNORMAL LOW (ref 12.0–15.0)
MCH: 29.8 pg (ref 26.0–34.0)
MCHC: 31.8 g/dL (ref 30.0–36.0)
MCV: 93.7 fL (ref 80.0–100.0)
Platelets: 216 10*3/uL (ref 150–400)
RBC: 3.83 MIL/uL — ABNORMAL LOW (ref 3.87–5.11)
RDW: 13.1 % (ref 11.5–15.5)
WBC: 4.9 10*3/uL (ref 4.0–10.5)
nRBC: 0 % (ref 0.0–0.2)

## 2020-01-04 LAB — COMPREHENSIVE METABOLIC PANEL
ALT: 20 U/L (ref 0–44)
AST: 23 U/L (ref 15–41)
Albumin: 4.1 g/dL (ref 3.5–5.0)
Alkaline Phosphatase: 44 U/L (ref 38–126)
Anion gap: 12 (ref 5–15)
BUN: 28 mg/dL — ABNORMAL HIGH (ref 8–23)
CO2: 23 mmol/L (ref 22–32)
Calcium: 9.4 mg/dL (ref 8.9–10.3)
Chloride: 103 mmol/L (ref 98–111)
Creatinine, Ser: 1.21 mg/dL — ABNORMAL HIGH (ref 0.44–1.00)
GFR calc Af Amer: 48 mL/min — ABNORMAL LOW (ref >60)
GFR calc non Af Amer: 42 mL/min — ABNORMAL LOW (ref >60)
Glucose, Bld: 105 mg/dL — ABNORMAL HIGH (ref 70–99)
Potassium: 4.7 mmol/L (ref 3.5–5.1)
Sodium: 138 mmol/L (ref 135–145)
Total Bilirubin: 0.3 mg/dL (ref 0.3–1.2)
Total Protein: 6.7 g/dL (ref 6.5–8.1)

## 2020-01-04 LAB — SALICYLATE LEVEL: Salicylate Lvl: <7 mg/dL — ABNORMAL LOW (ref 7.0–30.0)

## 2020-01-04 LAB — URINALYSIS, ROUTINE W REFLEX MICROSCOPIC
Bilirubin Urine: NEGATIVE
Glucose, UA: NEGATIVE mg/dL
Hgb urine dipstick: NEGATIVE
Ketones, ur: 20 mg/dL — AB
Nitrite: NEGATIVE
Protein, ur: NEGATIVE mg/dL
Specific Gravity, Urine: 1.012 (ref 1.005–1.030)
pH: 5 (ref 5.0–8.0)

## 2020-01-04 LAB — RAPID URINE DRUG SCREEN, HOSP PERFORMED
Amphetamines: NOT DETECTED
Barbiturates: NOT DETECTED
Benzodiazepines: NOT DETECTED
Cocaine: NOT DETECTED
Opiates: NOT DETECTED
Tetrahydrocannabinol: NOT DETECTED

## 2020-01-04 LAB — ACETAMINOPHEN LEVEL: Acetaminophen (Tylenol), Serum: <10 ug/mL — ABNORMAL LOW (ref 10–30)

## 2020-01-04 LAB — ETHANOL: Alcohol, Ethyl (B): <10 mg/dL (ref <10)

## 2020-01-04 MED ORDER — TRAZODONE HCL 50 MG PO TABS
50.0000 mg | ORAL_TABLET | Freq: Every day | ORAL | 0 refills | Status: DC
Start: 2020-01-04 — End: 2020-02-04

## 2020-01-04 NOTE — Telephone Encounter (Signed)
Granddaughter called stating pt called police to report her to social services accusing her of "hiding a child in her home", they came to home to investigate. When she got to the house pt was gone, she found her pulled over by the police because she took off driving and the police knew she wasn't safe. Pt refuses to go back to the house. Police have told her that she needs to go downtown and fill out paperwork for an involuntary committal and she wanted to ask if Dr Karel Jarvis thought this appropriate. Inst her that this sounds like what she needs to do, that pt is not safe to herself or others. She states she will start process.

## 2020-01-04 NOTE — ED Notes (Signed)
While conversing with pt pt states she is at the hospital because [name] is trying to get rid of her to obtain a hold of all her valuables. Pt states she has three houses they want to take away. She has been taking care of a little boy who is here at the ED. Pt is calm and cooperative at this time. No sitter available.

## 2020-01-04 NOTE — Telephone Encounter (Signed)
Agree to IVC, thanks

## 2020-01-04 NOTE — Telephone Encounter (Signed)
Patient granddaughter [name] needs to speak to someone. Patient has been up all night and ran out in the road 4 am this morning. She drove over to her granddaughter work with only about a 6-10 min because she her granddaughter child was screaming. The child was with the granddaughter at work. She is not sure what to do to help or how to help the patient   Please call

## 2020-01-04 NOTE — ED Notes (Signed)
Pt states she can hear her grandson. She states he is crying out for her. Concerned of the well being of her grandson

## 2020-01-04 NOTE — ED Notes (Signed)
Pt daughter requesting updates when available [phone] [name]

## 2020-01-04 NOTE — Telephone Encounter (Signed)
Pt c/o: worsening dementia/more confused New medications?  No. When did changes start?  Since visit with Dr Karel Jarvis Sleeping okay? No. Has patient been checked for infection, including UTI?  Yes.   a couple weeks ago in ED Danger to themselves or others? Yes.   Current medications prescribed by Dr. Karel Jarvis: tylenol PM, has not taken Aricept since 7/13, slept well for one night and now awake all night, aggressive, set off alarm when ran out of house at 4:00 AM and was in the middle of the street, trying to jump out of the window, accusing family members incl young children of inappropriate behavior, granddaughter states she thinks she'd like placement for pt at least until she can be on medication for awhile and behavior is manageable. Per Dr Karel Jarvis the pt can be taken to the ED because she's a danger to herself and hopefully they will deem placement in a facility as necessary or family can contact ALFs themselves to find her a bed. Granddaughter refused info on facilities stating she knows it will be an OOP cost to them and is financially not possible and because pt was taken to the ED recently and her behavior stabilized and she was not deemed appropriate for direct placement she does not want to do this again. She questioned whether a different provider could facilitate direct admission for medical purposes but informed this is not something that can be done outside of the hospital or ED. Will send in prescription for trazodone 50 mg at HS, inst her to give this instead of tylenol PM and cont to hold Aricept for now, she verbalized understanding and will call with any further needs or questions.

## 2020-01-04 NOTE — ED Notes (Signed)
Staffing informed sitter required for patient. Per rep., to attempt to send sitter

## 2020-01-04 NOTE — ED Triage Notes (Addendum)
Patient arrives to ED with [employer] under IVC by patients daughter. Per [employer] patient has been hearing voices and having visual hallucinations over the past couple weeks to month. Today patient was up crawling in the attic insulation because she heard kids voices, running into the road at night, and sees people with guns. Daughter states she is worried that patient will hurt herself by accident and she needs a psychiatric evaluation.

## 2020-01-04 NOTE — ED Notes (Signed)
Granddaughter  please call if discharged tracyi [phone]
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
## 2020-01-05 ENCOUNTER — Emergency Department (HOSPITAL_COMMUNITY): Payer: Medicare PPO

## 2020-01-05 DIAGNOSIS — R109 Unspecified abdominal pain: Secondary | ICD-10-CM | POA: Diagnosis not present

## 2020-01-05 DIAGNOSIS — R4182 Altered mental status, unspecified: Secondary | ICD-10-CM | POA: Diagnosis not present

## 2020-01-05 DIAGNOSIS — G319 Degenerative disease of nervous system, unspecified: Secondary | ICD-10-CM | POA: Diagnosis not present

## 2020-01-05 DIAGNOSIS — I708 Atherosclerosis of other arteries: Secondary | ICD-10-CM | POA: Diagnosis not present

## 2020-01-05 LAB — SARS CORONAVIRUS 2 BY RT PCR (HOSPITAL ORDER, PERFORMED IN ~~LOC~~ HOSPITAL LAB): SARS Coronavirus 2: NEGATIVE

## 2020-01-05 MED ORDER — ACETAMINOPHEN 500 MG PO TABS: 500.0000 mg | ORAL_TABLET | Freq: Four times a day (QID) | ORAL | Status: DC | PRN

## 2020-01-05 MED ORDER — ASPIRIN EC 81 MG PO TBEC
81.0000 mg | DELAYED_RELEASE_TABLET | Freq: Every day | ORAL | Status: DC
  Administered 2020-01-05 – 2020-01-08 (×4): 81 mg via ORAL
  Filled 2020-01-05 (×4): qty 1

## 2020-01-05 MED ORDER — LEVOTHYROXINE SODIUM 50 MCG PO TABS
50.0000 ug | ORAL_TABLET | Freq: Every day | ORAL | Status: DC
  Administered 2020-01-06 – 2020-01-08 (×3): 50 ug via ORAL
  Filled 2020-01-05 (×3): qty 1

## 2020-01-05 MED ORDER — LORAZEPAM 1 MG PO TABS
2.0000 mg | ORAL_TABLET | Freq: Once | ORAL | Status: AC | PRN
  Administered 2020-01-06: 2 mg via ORAL
  Filled 2020-01-05: qty 2

## 2020-01-05 MED ORDER — GADOBUTROL 1 MMOL/ML IV SOLN
7.0000 mL | Freq: Once | INTRAVENOUS | Status: AC | PRN
  Administered 2020-01-05: 7 mL via INTRAVENOUS

## 2020-01-05 MED ORDER — SIMVASTATIN 20 MG PO TABS
20.0000 mg | ORAL_TABLET | Freq: Every day | ORAL | Status: DC
  Administered 2020-01-05 – 2020-01-07 (×3): 20 mg via ORAL
  Filled 2020-01-05 (×3): qty 1

## 2020-01-05 MED ORDER — LISINOPRIL 2.5 MG PO TABS
5.0000 mg | ORAL_TABLET | Freq: Every day | ORAL | Status: DC
  Administered 2020-01-05 – 2020-01-08 (×4): 5 mg via ORAL
  Filled 2020-01-05 (×3): qty 2
  Filled 2020-01-05: qty 1

## 2020-01-05 MED ORDER — TRAZODONE HCL 50 MG PO TABS
50.0000 mg | ORAL_TABLET | Freq: Every day | ORAL | Status: DC
  Administered 2020-01-05: 50 mg via ORAL
  Filled 2020-01-05: qty 1

## 2020-01-05 NOTE — ED Notes (Signed)
Patient is resting comfortably. 

## 2020-01-05 NOTE — ED Notes (Signed)
Pt sitting and eating lunch

## 2020-01-05 NOTE — Progress Notes (Signed)
Pt is on the waitlist at [facility] per Cat in admissions.  Lovina Zuver S. Alan Ripper, MSW, LCSW Clinical Social Worker 01/05/2020 3:52 PM

## 2020-01-05 NOTE — ED Provider Notes (Signed)
Patient has dementia and seen neurology who is recommending MRI and some other medications to try to help with her new hallucinations that were initially auditory but now is having visual hallucinations, paranoia and delusions as well she is imagining a baby that does not exist as being killed in her attic and she has been searching for to cut his head off.  Doing multiple things just do not make any sense likely related to the dementia.  resident discussed situation with  Neurology who stated they did not need to be officially consulted unless an abnormality found on MRI but the MRI here is negative.  At this time I feel the patient is a danger to herself and has already been involuntarily committed I have filled out first exam paperwork.  At this time I think she needs consultation with psychiatry for placement and medication management stabilization.    Patient is medically cleared for this process.   Graceanne Guin, Barbara Cower, MD 01/05/20 [phone]

## 2020-01-05 NOTE — ED Notes (Signed)
All pt belongings inventoried and pt wanded by security. Belongings stored in [place] [address] in ED Purple zone

## 2020-01-05 NOTE — Progress Notes (Signed)
TTS attempted to engage patient in assessment. Patient appeared to be sleeping soundly and did not respond to clinician calling name several times. TTS will follow up.  Darreld Mclean, LCSWA

## 2020-01-05 NOTE — Progress Notes (Signed)
Patient meets criteria for inpatient treatment [facility]) per Assunta Found NP.  No appropriate beds available at [facility]. CSW faxed referrals to the following facilities for review:  [facility]-no bed availability today, 7/17. [facility]  TTS will continue to seek bed placement.   Trula Slade, MSW, LCSW Clinical Social Worker 01/05/2020 2:46 PM

## 2020-01-05 NOTE — ED Notes (Signed)
TTS on progress.

## 2020-01-05 NOTE — ED Notes (Signed)
Patient transported to X-ray 

## 2020-01-05 NOTE — ED Notes (Signed)
Pt's lunch ordered 

## 2020-01-05 NOTE — ED Notes (Signed)
Spoke with granddaughter and provided update. Pt was recently dx with dementia
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
## 2020-01-05 NOTE — ED Notes (Signed)
Pt ambulated to shower and back to room and then once in room she went back out and ambulate to RR

## 2020-01-06 ENCOUNTER — Encounter (HOSPITAL_COMMUNITY): Payer: Self-pay | Admitting: Registered Nurse

## 2020-01-06 DIAGNOSIS — R4689 Other symptoms and signs involving appearance and behavior: Secondary | ICD-10-CM | POA: Diagnosis not present

## 2020-01-06 DIAGNOSIS — R443 Hallucinations, unspecified: Secondary | ICD-10-CM | POA: Diagnosis not present

## 2020-01-06 DIAGNOSIS — F22 Delusional disorders: Secondary | ICD-10-CM | POA: Diagnosis present

## 2020-01-06 LAB — URINE CULTURE: Culture: NO GROWTH

## 2020-01-06 MED ORDER — POLYETHYLENE GLYCOL 3350 17 G PO PACK
17.0000 g | PACK | Freq: Once | ORAL | Status: AC
  Administered 2020-01-06: 17 g via ORAL
  Filled 2020-01-06: qty 1

## 2020-01-06 MED ORDER — POLYETHYLENE GLYCOL 3350 17 GM/SCOOP PO POWD
1.0000 | Freq: Once | ORAL | Status: DC
  Filled 2020-01-06: qty 255

## 2020-01-06 MED ORDER — ESCITALOPRAM OXALATE 10 MG PO TABS
5.0000 mg | ORAL_TABLET | Freq: Every day | ORAL | Status: DC
  Administered 2020-01-06 – 2020-01-08 (×3): 5 mg via ORAL
  Filled 2020-01-06 (×3): qty 1

## 2020-01-06 MED ORDER — QUETIAPINE FUMARATE 50 MG PO TABS
100.0000 mg | ORAL_TABLET | Freq: Every day | ORAL | Status: DC
  Administered 2020-01-06 – 2020-01-07 (×2): 100 mg via ORAL
  Filled 2020-01-06 (×2): qty 2

## 2020-01-06 NOTE — ED Notes (Signed)
Telepsych completed.  

## 2020-01-06 NOTE — Consult Note (Signed)
Telepsych Consultation   Reason for Consult:  Delusional thoughts Referring Physician:  Marily Memos, MD Location of Patient: [facility] ED Location of Provider: [facility]  Patient Identification: THANDIWE SIRAGUSA MRN:  [id number] Principal Diagnosis: Delusional thoughts [facility]) Diagnosis:  Principal Problem:   Delusional thoughts (HCC)   Total Time spent with patient: 30 minutes  Subjective:   MIRELLA GUEYE is a 84 y.o. female patient admitted [facility] ED with complaints of worsening of delusional thoughts.   HPI:  AMIRRAH QUIGLEY, 84 y.o., female patient seen via tele psych by this provider, consulted with Dr. Jola Babinski; and chart reviewed on 01/06/20.  On evaluation DORETTE HARTEL sitting on side of bed she is oriented to location and self.  States that she is unsure why she was brought to the hospital.  States that she has been having problems at with her grandchildren and that she is unsure if they had gotten into a fight "but everything just blew up last night." Patient states that she is eating and sleeping without difficulty. Denies suicidal/self-harm/homicidal ideation, psychosis, and paranoia.  States that she lives with her daughter in law, granddaughter, and grandchildren.  Patient continues to express concerns about a grandson that was supposedly adopted and has been calling her wanting to come home because he is noting being treated right.  Patient became tearful when talking about child.      Past Psychiatric History: No prior psychiatric history  Risk to Self:  No Risk to Others:  No Prior Inpatient Therapy:  No Prior Outpatient Therapy:  No  Past Medical History:  Past Medical History:  Diagnosis Date  . Hypertension   . Stroke (HCC)   . Thyroid disease    History reviewed. No pertinent surgical history. Family History: History reviewed. No pertinent family history. Family Psychiatric  History: Unaware Social History:  Social History   Substance and Sexual Activity   Alcohol Use None     Social History   Substance and Sexual Activity  Drug Use Not on file    Social History   Socioeconomic History  . Marital status: Widowed    Spouse name: Not on file  . Number of children: Not on file  . Years of education: Not on file  . Highest education level: Not on file  Occupational History  . Not on file  Tobacco Use  . Smoking status: Not on file  Substance and Sexual Activity  . Alcohol use: Not on file  . Drug use: Not on file  . Sexual activity: Not on file  Other Topics Concern  . Not on file  Social History Narrative  . Not on file   Social Determinants of Health   Financial Resource Strain:   . Difficulty of Paying Living Expenses:   Food Insecurity:   . Worried About Programme researcher, broadcasting/film/video in the Last Year:   . Barista in the Last Year:   Transportation Needs:   . Freight forwarder (Medical):   Marland Kitchen Lack of Transportation (Non-Medical):   Physical Activity:   . Days of Exercise per Week:   . Minutes of Exercise per Session:   Stress:   . Feeling of Stress :   Social Connections:   . Frequency of Communication with Friends and Family:   . Frequency of Social Gatherings with Friends and Family:   . Attends Religious Services:   . Active Member of Clubs or Organizations:   . Attends Banker  Meetings:   Marland Kitchen Marital Status:    Additional Social History:    Allergies:   Allergies  Allergen Reactions  . Donepezil Other (See Comments)    POSSIBLE (??) Hallucinations and sleep disturbances    Labs:  Results for orders placed or performed during the hospital encounter of 01/04/20 (from the past 48 hour(s))  Comprehensive metabolic panel     Status: Abnormal   Collection Time: 01/04/20  4:37 PM  Result Value Ref Range   Sodium 138 135 - 145 mmol/L   Potassium 4.7 3.5 - 5.1 mmol/L   Chloride 103 98 - 111 mmol/L   CO2 23 22 - 32 mmol/L   Glucose, Bld 105 (H) 70 - 99 mg/dL    Comment: Glucose reference  range applies only to samples taken after fasting for at least 8 hours.   BUN 28 (H) 8 - 23 mg/dL   Creatinine, Ser 8.11 (H) 0.44 - 1.00 mg/dL   Calcium 9.4 8.9 - 91.4 mg/dL   Total Protein 6.7 6.5 - 8.1 g/dL   Albumin 4.1 3.5 - 5.0 g/dL   AST 23 15 - 41 U/L   ALT 20 0 - 44 U/L   Alkaline Phosphatase 44 38 - 126 U/L   Total Bilirubin 0.3 0.3 - 1.2 mg/dL   GFR calc non Af Amer 42 (L) >60 mL/min   GFR calc Af Amer 48 (L) >60 mL/min   Anion gap 12 5 - 15    Comment: Performed at [facility] Lab, [address], [place], [place] [address]  Ethanol     Status: None   Collection Time: 01/04/20  4:37 PM  Result Value Ref Range   Alcohol, Ethyl (B) <10 <10 mg/dL    Comment: (NOTE) Lowest detectable limit for serum alcohol is 10 mg/dL.  For medical purposes only. Performed at [facility], [address] [place], [place] [address]   Salicylate level     Status: Abnormal   Collection Time: 01/04/20  4:37 PM  Result Value Ref Range   Salicylate Lvl <7.0 (L) 7.0 - 30.0 mg/dL    Comment: Performed at [facility] Lab, [address] [place], [place] [address]  Acetaminophen level     Status: Abnormal   Collection Time: 01/04/20  4:37 PM  Result Value Ref Range   Acetaminophen (Tylenol), Serum <10 (L) 10 - 30 ug/mL    Comment: (NOTE) Therapeutic concentrations vary significantly. A range of 10-30 ug/mL  may be an effective concentration for many patients. However, some  are best treated at concentrations outside of this range. Acetaminophen concentrations >150 ug/mL at 4 hours after ingestion  and >50 ug/mL at 12 hours after ingestion are often associated with  toxic reactions.  Performed at [facility], [address] [place], [place] [address]   cbc     Status: Abnormal   Collection Time: 01/04/20  4:37 PM  Result Value Ref Range   WBC 4.9 4.0 - 10.5 K/uL   RBC 3.83 (L) 3.87 - 5.11 MIL/uL   Hemoglobin 11.4 (L) 12.0 - 15.0 g/dL   HCT 95.2 (L) 36 - 46 %    MCV 93.7 80.0 - 100.0 fL   MCH 29.8 26.0 - 34.0 pg   MCHC 31.8 30.0 - 36.0 g/dL   RDW 84.1 32.4 - 40.1 %   Platelets 216 150 - 400 K/uL   nRBC 0.0 0.0 - 0.2 %    Comment: Performed at [facility] Lab, [address] [place], [place] [address]  Rapid urine drug screen (hospital performed)     Status: None   Collection Time: 01/04/20  8:18 PM  Result Value Ref Range   Opiates NONE DETECTED NONE DETECTED   Cocaine NONE DETECTED NONE DETECTED   Benzodiazepines NONE DETECTED NONE DETECTED   Amphetamines NONE DETECTED NONE DETECTED   Tetrahydrocannabinol NONE DETECTED NONE DETECTED   Barbiturates NONE DETECTED NONE DETECTED    Comment: (NOTE) DRUG SCREEN FOR MEDICAL PURPOSES ONLY.  IF CONFIRMATION IS NEEDED FOR ANY PURPOSE, NOTIFY LAB WITHIN 5 DAYS.  LOWEST DETECTABLE LIMITS FOR URINE DRUG SCREEN Drug Class                     Cutoff (ng/mL) Amphetamine and metabolites    1000 Barbiturate and metabolites    200 Benzodiazepine                 200 Tricyclics and metabolites     300 Opiates and metabolites        300 Cocaine and metabolites        300 THC                            50 Performed at [facility] Lab, [address], [place], [place] [address]   Urinalysis, Routine w reflex microscopic     Status: Abnormal   Collection Time: 01/04/20 11:20 PM  Result Value Ref Range   Color, Urine YELLOW YELLOW   APPearance CLEAR CLEAR   Specific Gravity, Urine 1.012 1.005 - 1.030   pH 5.0 5.0 - 8.0   Glucose, UA NEGATIVE NEGATIVE mg/dL   Hgb urine dipstick NEGATIVE NEGATIVE   Bilirubin Urine NEGATIVE NEGATIVE   Ketones, ur 20 (A) NEGATIVE mg/dL   Protein, ur NEGATIVE NEGATIVE mg/dL   Nitrite NEGATIVE NEGATIVE   Leukocytes,Ua MODERATE (A) NEGATIVE   RBC / HPF 0-5 0 - 5 RBC/hpf   WBC, UA 6-10 0 - 5 WBC/hpf   Bacteria, UA RARE (A) NONE SEEN   Squamous Epithelial / LPF 0-5 0 - 5   Mucus PRESENT    Hyaline Casts, UA PRESENT     Comment: Performed at [facility]  Lab, [address], [place], [place] [address]  Urine culture     Status: None   Collection Time: 01/04/20 11:20 PM   Specimen: Urine, Random  Result Value Ref Range   Specimen Description URINE, RANDOM    Special Requests NONE    Culture      NO GROWTH Performed at [facility] Lab, [address] [place], [place] [address]    Report Status 01/06/2020 FINAL   SARS Coronavirus 2 by RT PCR (hospital order, performed in [name] hospital lab) Nasopharyngeal Nasopharyngeal Swab     Status: None   Collection Time: 01/05/20  3:05 AM   Specimen: Nasopharyngeal Swab  Result Value Ref Range   SARS Coronavirus 2 NEGATIVE NEGATIVE    Comment: (NOTE) SARS-CoV-2 target nucleic acids are NOT DETECTED.  The SARS-CoV-2 RNA is generally detectable in upper and lower respiratory specimens during the acute phase of infection. The lowest concentration of SARS-CoV-2 viral copies this assay can detect is 250 copies / mL. A negative result does not preclude SARS-CoV-2 infection and should not be used as the sole basis for treatment or other patient management decisions.  A negative result may occur with improper  specimen collection / handling, submission of specimen other than nasopharyngeal swab, presence of viral mutation(s) within the areas targeted by this assay, and inadequate number of viral copies (<250 copies / mL). A negative result must be combined with clinical observations, patient history, and epidemiological information.  Fact Sheet for Patients:   BoilerBrush.com.cyhttps://www.fda.gov/media/136312/download  Fact Sheet for Healthcare Providers: https://pope.com/https://www.fda.gov/media/136313/download  This test is not yet approved or  cleared by the [place] FDA and has been authorized for detection and/or diagnosis of SARS-CoV-2 by FDA under an Emergency Use Authorization (EUA).  This EUA will remain in effect (meaning this test can be used) for the duration of the COVID-19 declaration under Section  564(b)(1) of the Act, 21 U.S.C. section 360bbb-3(b)(1), unless the authorization is terminated or revoked sooner.  Performed at [facility], [address] [place], [place] [address]     Medications:  Current Facility-Administered Medications  Medication Dose Route Frequency Provider Last Rate Last Admin  . acetaminophen (TYLENOL) tablet 500 mg  500 mg Oral Q6H PRN Mare FerrariBelaya, Maria A, PA-C      . aspirin EC tablet 81 mg  81 mg Oral Daily Trudee GripBelaya, Maria A, PA-C   81 mg at 01/06/20 1004  . escitalopram (LEXAPRO) tablet 5 mg  5 mg Oral Daily Akira Adelsberger B, NP      . levothyroxine (SYNTHROID) tablet 50 mcg  50 mcg Oral QAC breakfast Trudee GripBelaya, Maria A, PA-C   50 mcg at 01/06/20 0806  . lisinopril (ZESTRIL) tablet 5 mg  5 mg Oral Daily Trudee GripBelaya, Maria A, PA-C   5 mg at 01/06/20 1004  . LORazepam (ATIVAN) tablet 2 mg  2 mg Oral Once PRN Mesner, Barbara CowerJason, MD      . polyethylene glycol powder (GLYCOLAX/MIRALAX) container 255 g  1 Container Oral Once Mare FerrariBelaya, Maria A, PA-C      . QUEtiapine (SEROQUEL) tablet 100 mg  100 mg Oral QHS Andree Heeg B, NP      . simvastatin (ZOCOR) tablet 20 mg  20 mg Oral QHS Belaya, Maria A, PA-C   20 mg at 01/05/20 2112   Current Outpatient Medications  Medication Sig Dispense Refill  . acetaminophen (TYLENOL) 500 MG tablet Take 500 mg by mouth every 6 (six) hours as needed for mild pain, moderate pain or headache.    Marland Kitchen. aspirin EC 81 MG tablet Take 81 mg by mouth daily. Swallow whole.    . levothyroxine (SYNTHROID) 50 MCG tablet Take 50 mcg by mouth daily before breakfast.    . lisinopril (ZESTRIL) 5 MG tablet Take 5 mg by mouth daily.    . simvastatin (ZOCOR) 20 MG tablet Take 20 mg by mouth at bedtime.     . donepezil (ARICEPT) 10 MG tablet Take 1/2 tablet daily for 2 weeks, then increase to 1 tablet daily (Patient not taking: Reported on 01/04/2020) 30 tablet 11  . traZODone (DESYREL) 50 MG tablet Take 1 tablet (50 mg total) by mouth at bedtime. 30 tablet 0     Musculoskeletal: Strength & Muscle Tone: within normal limits Gait & Station: normal Patient leans: N/A  Psychiatric Specialty Exam: Physical Exam Vitals and nursing note reviewed. Exam conducted with a chaperone present.  Constitutional:      Appearance: Normal appearance.  Pulmonary:     Effort: Pulmonary effort is normal.  Musculoskeletal:        General: Normal range of motion.  Neurological:     Mental Status: She is alert.  Psychiatric:  Attention and Perception: Attention normal.        Mood and Affect: Mood is anxious and depressed.        Speech: Speech normal.        Behavior: Behavior normal. Behavior is cooperative.        Thought Content: Thought content is delusional.        Cognition and Memory: Cognition is impaired. Memory is impaired.        Judgment: Judgment is impulsive.     Review of Systems  Psychiatric/Behavioral: Positive for confusion. Negative for self-injury, sleep disturbance and suicidal ideas. The patient is nervous/anxious.        Delusional thoughts that daughter in law or granddaughter has given up a baby for adoption and child has been calling her wanting to come home  All other systems reviewed and are negative.   Blood pressure 118/84, pulse 100, temperature 98 F (36.7 C), temperature source Oral, resp. rate 17, height  (1.676 m), weight 64 kg, SpO2 100 %.Body mass index is 22.77 kg/m.  General Appearance: Casual  Eye Contact:  Good  Speech:  Clear and Coherent and Normal Rate  Volume:  Normal  Mood:  Anxious  Affect:  Tearful  Thought Process:  Disorganized, Linear and Descriptions of Associations: Tangential  Orientation:  Other:  self and location  Thought Content:  Delusions  Suicidal Thoughts:  No  Homicidal Thoughts:  No  Memory:  Immediate;   Poor Recent;   Poor  Judgement:  Impaired  Insight:  Fair  Psychomotor Activity:  Normal  Concentration:  Concentration: Fair and Attention Span: Fair  Recall:   Fair  Fund of Knowledge:  Good  Language:  Good  Akathisia:  No  Handed:  Right  AIMS (if indicated):     Assets:  Communication Skills Desire for Improvement Housing Social Support  ADL's:  Intact  Cognition:  Impaired,  Mild  Sleep:        Treatment Plan Summary: Daily contact with patient to assess and evaluate symptoms and progress in treatment, Medication management and Plan Inpatient psychiatric treatment  Medication management: Start Seroquel 100 mg Q hs for mood stability, depression, delusions Start Lexapro 5 mg daily for depression:  May or may not be beneficial; depression related to delusional thoughts.   Discontinue Trazodone.    Disposition: Recommend psychiatric Inpatient admission when medically cleared.  This service was provided via telemedicine using a 2-way, interactive audio and video technology.  Names of all persons participating in this telemedicine service and their role in this encounter. Name: Assunta Found Role: NP  Name: Dr. Jefferey Pica Role: Psychiatrist  Name: Tillie Fantasia Role: Patein  Name: Jerrol Banana Role: RN    Assunta Found, NP 01/06/2020 2:09 PM
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
## 2020-01-06 NOTE — ED Notes (Signed)
Left message for [name], pt's granddaughter - [phone] - attempting to return her call.

## 2020-01-06 NOTE — ED Notes (Signed)
Granddaughter called checking on pt and is now talking w/pt on phone at nurses' desk.

## 2020-01-06 NOTE — ED Provider Notes (Signed)
Emergency Medicine Observation Re-evaluation Note  [name] is a 84 y.o. female, seen on rounds today.  Pt initially presented to the ED for complaints of IVC and Psychiatric Evaluation Currently, the patient is resting comfortably in the bed.  Physical Exam  BP 126/68 (BP Location: Right Arm)   Pulse 79   Temp 97.7 F (36.5 C) (Oral)   Resp 16   Ht 5\' 6"  (1.676 m)   Wt 64 kg   SpO2 96%   BMI 22.77 kg/m  Physical Exam Vitals reviewed.  Constitutional:      Appearance: Normal appearance.  HENT:     Head: Normocephalic and atraumatic.  Eyes:     General:        Right eye: No discharge.        Left eye: No discharge.     Extraocular Movements: Extraocular movements intact.     Conjunctiva/sclera: Conjunctivae normal.  Musculoskeletal:        General: No swelling. Normal range of motion.  Neurological:     General: No focal deficit present.     Mental Status: She is alert and oriented to person, place, and time.  Psychiatric:        Mood and Affect: Mood normal.        Behavior: Behavior normal.     ED Course / MDM  EKG:  I have reviewed the labs performed to date as well as medications administered while in observation.  Recent changes in the last 24 hours include none. Plan  Current plan is for Sunday bed at [facility].  Awaiting placement and transport Patient is under full IVC at this time.   [facility], PA-C 01/06/20 1147    01/08/20, MD 01/06/20 1318

## 2020-01-06 NOTE — ED Notes (Signed)
Per [place], BH APP, will start pt on new meds.
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
## 2020-01-06 NOTE — ED Notes (Signed)
Lunch ordered 

## 2020-01-06 NOTE — ED Notes (Signed)
Pt had c/o constipation - Order received for Miralax - Pt had BM - stated she felt better.
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
## 2020-01-07 NOTE — ED Notes (Signed)
Lunch Trays Ordered @ 1101. 

## 2020-01-07 NOTE — ED Notes (Signed)
ED provider at bedside.

## 2020-01-07 NOTE — ED Notes (Signed)
Pt being reevaluated by TTS.

## 2020-01-07 NOTE — ED Notes (Signed)
Pt's dinner ordered °

## 2020-01-07 NOTE — BH Assessment (Signed)
Reassessment note:  Pt presents sitting upright in bed wearing scrubs. Affect is flat. She bluntly states she is fine. Pt states she is sleepy and not awake. She states she is worried about the little boy and asks if I've heard if he is okay. Pt denies SI & HI. She states she never has been that way. Pt reports she has been lonely in the past, but she doesn't think depressed.

## 2020-01-07 NOTE — ED Notes (Signed)
Pt's lunch ordered 

## 2020-01-07 NOTE — ED Notes (Addendum)
Pt's lunch tray arrived. Pt sitting up in chair. Pt denies any needs at this time.

## 2020-01-07 NOTE — ED Notes (Signed)
Pt calm and cooperative, pleasantly confused, pt continues to have delusions about a great grandchild and is worried that he is missing and dead.

## 2020-01-07 NOTE — ED Provider Notes (Signed)
Emergency Medicine Observation Re-evaluation Note  [name] is a 84 y.o. female, seen on rounds today.  Pt initially presented to the ED for complaints of IVC and Psychiatric Evaluation Currently, the patient is resting in her chair.  She was seen by neurology who had recommended MRI during this ED encounter which was reviewed and negative for any acute changes.  I spoke with the RN reports no overnight events.  An ER tech noted that patient had tried to ambulate independently earlier this morning and was found to be unsteady and falling to the left.  This conflicts with prior reports that note was strength and coordination intact.  Labs largely reassuring.  Physical Exam  BP 123/73 (BP Location: Left Arm)   Pulse 100   Temp 97.9 F (36.6 C) (Oral)   Resp 16   Ht 5\' 6"  (1.676 m)   Wt 64 kg   SpO2 96%   BMI 22.77 kg/m  Physical Exam Vitals and nursing note reviewed. Exam conducted with a chaperone present.  Constitutional:      Appearance: Normal appearance.  HENT:     Head: Normocephalic and atraumatic.  Eyes:     General: No scleral icterus.    Conjunctiva/sclera: Conjunctivae normal.  Cardiovascular:     Rate and Rhythm: Normal rate.  Pulmonary:     Effort: Pulmonary effort is normal.  Musculoskeletal:     Comments: Left leg weakness when compared to right.  Mild unsteadiness on feet.   Skin:    General: Skin is dry.  Neurological:     Mental Status: She is alert.     GCS: GCS eye subscore is 4. GCS verbal subscore is 5. GCS motor subscore is 6.  Psychiatric:        Mood and Affect: Mood normal.        Behavior: Behavior normal.        Thought Content: Thought content normal.     ED Course / MDM  EKG:  Clinical Course as of Jan 07 1215  Fri Jan 04, 2020  2254 SpO2: 97 % [AP]    Clinical Course User Index [AP] Jan 06, 2020, MD   I have reviewed the labs performed to date as well as medications administered while in observation.  Recent changes in the last  24 hours include none, no reported ON events. Plan  Current plan is for placement at [facility].  Awaiting bed. Patient is under full IVC at this time.  I spoke with patient's granddaughter, [facility], who is her primary caretaker.  I asked about her gait disturbance this morning here in the ED and her mildly diminished strength in left leg.  I discussed with her my concern for stroke given her history of stroke in the past and left-sided leg weakness that was not noted in prior charts.  She states that she has a left-sided hip replacement scheduled for August and that is not anything new or unusual.  We will not proceed with imaging at this time.    September, PA-C 01/07/20 1216    01/09/20, MD 01/07/20 [phone]

## 2020-01-08 ENCOUNTER — Emergency Department (HOSPITAL_COMMUNITY): Payer: Medicare PPO

## 2020-01-08 DIAGNOSIS — F0391 Unspecified dementia with behavioral disturbance: Secondary | ICD-10-CM | POA: Diagnosis not present

## 2020-01-08 DIAGNOSIS — E039 Hypothyroidism, unspecified: Secondary | ICD-10-CM | POA: Diagnosis not present

## 2020-01-08 DIAGNOSIS — R440 Auditory hallucinations: Secondary | ICD-10-CM | POA: Diagnosis not present

## 2020-01-08 DIAGNOSIS — G309 Alzheimer's disease, unspecified: Secondary | ICD-10-CM | POA: Diagnosis not present

## 2020-01-08 DIAGNOSIS — E538 Deficiency of other specified B group vitamins: Secondary | ICD-10-CM | POA: Diagnosis not present

## 2020-01-08 DIAGNOSIS — R0602 Shortness of breath: Secondary | ICD-10-CM | POA: Diagnosis not present

## 2020-01-08 DIAGNOSIS — R8290 Unspecified abnormal findings in urine: Secondary | ICD-10-CM | POA: Diagnosis not present

## 2020-01-08 DIAGNOSIS — M8949 Other hypertrophic osteoarthropathy, multiple sites: Secondary | ICD-10-CM | POA: Diagnosis not present

## 2020-01-08 DIAGNOSIS — R456 Violent behavior: Secondary | ICD-10-CM | POA: Diagnosis not present

## 2020-01-08 DIAGNOSIS — Z8619 Personal history of other infectious and parasitic diseases: Secondary | ICD-10-CM | POA: Diagnosis not present

## 2020-01-08 DIAGNOSIS — Z7982 Long term (current) use of aspirin: Secondary | ICD-10-CM | POA: Diagnosis not present

## 2020-01-08 DIAGNOSIS — N3000 Acute cystitis without hematuria: Secondary | ICD-10-CM | POA: Diagnosis not present

## 2020-01-08 DIAGNOSIS — D649 Anemia, unspecified: Secondary | ICD-10-CM | POA: Diagnosis not present

## 2020-01-08 DIAGNOSIS — F0281 Dementia in other diseases classified elsewhere with behavioral disturbance: Secondary | ICD-10-CM | POA: Diagnosis not present

## 2020-01-08 DIAGNOSIS — R451 Restlessness and agitation: Secondary | ICD-10-CM | POA: Diagnosis not present

## 2020-01-08 DIAGNOSIS — Z8673 Personal history of transient ischemic attack (TIA), and cerebral infarction without residual deficits: Secondary | ICD-10-CM | POA: Diagnosis not present

## 2020-01-08 DIAGNOSIS — F03918 Unspecified dementia, unspecified severity, with other behavioral disturbance: Secondary | ICD-10-CM | POA: Insufficient documentation

## 2020-01-08 DIAGNOSIS — B951 Streptococcus, group B, as the cause of diseases classified elsewhere: Secondary | ICD-10-CM | POA: Diagnosis not present

## 2020-01-08 DIAGNOSIS — E782 Mixed hyperlipidemia: Secondary | ICD-10-CM | POA: Diagnosis not present

## 2020-01-08 DIAGNOSIS — Z79899 Other long term (current) drug therapy: Secondary | ICD-10-CM | POA: Diagnosis not present

## 2020-01-08 DIAGNOSIS — Z20822 Contact with and (suspected) exposure to covid-19: Secondary | ICD-10-CM | POA: Diagnosis not present

## 2020-01-08 DIAGNOSIS — I10 Essential (primary) hypertension: Secondary | ICD-10-CM | POA: Diagnosis not present

## 2020-01-08 DIAGNOSIS — R410 Disorientation, unspecified: Secondary | ICD-10-CM | POA: Diagnosis not present

## 2020-01-08 LAB — CBC WITH DIFFERENTIAL/PLATELET
Abs Immature Granulocytes: 0.01 10*3/uL (ref 0.00–0.07)
Basophils Absolute: 0 10*3/uL (ref 0.0–0.1)
Basophils Relative: 1 %
Eosinophils Absolute: 0.1 10*3/uL (ref 0.0–0.5)
Eosinophils Relative: 2 %
HCT: 34.8 % — ABNORMAL LOW (ref 36.0–46.0)
Hemoglobin: 11 g/dL — ABNORMAL LOW (ref 12.0–15.0)
Immature Granulocytes: 0 %
Lymphocytes Relative: 23 %
Lymphs Abs: 1 10*3/uL (ref 0.7–4.0)
MCH: 29.3 pg (ref 26.0–34.0)
MCHC: 31.6 g/dL (ref 30.0–36.0)
MCV: 92.8 fL (ref 80.0–100.0)
Monocytes Absolute: 0.4 10*3/uL (ref 0.1–1.0)
Monocytes Relative: 9 %
Neutro Abs: 2.9 10*3/uL (ref 1.7–7.7)
Neutrophils Relative %: 65 %
Platelets: 176 10*3/uL (ref 150–400)
RBC: 3.75 MIL/uL — ABNORMAL LOW (ref 3.87–5.11)
RDW: 12.9 % (ref 11.5–15.5)
WBC: 4.3 10*3/uL (ref 4.0–10.5)
nRBC: 0 % (ref 0.0–0.2)

## 2020-01-08 LAB — COMPREHENSIVE METABOLIC PANEL
ALT: 18 U/L (ref 0–44)
AST: 18 U/L (ref 15–41)
Albumin: 3.5 g/dL (ref 3.5–5.0)
Alkaline Phosphatase: 36 U/L — ABNORMAL LOW (ref 38–126)
Anion gap: 7 (ref 5–15)
BUN: 21 mg/dL (ref 8–23)
CO2: 29 mmol/L (ref 22–32)
Calcium: 8.8 mg/dL — ABNORMAL LOW (ref 8.9–10.3)
Chloride: 100 mmol/L (ref 98–111)
Creatinine, Ser: 0.87 mg/dL (ref 0.44–1.00)
GFR calc Af Amer: >60 mL/min (ref >60)
GFR calc non Af Amer: >60 mL/min (ref >60)
Glucose, Bld: 100 mg/dL — ABNORMAL HIGH (ref 70–99)
Potassium: 4.5 mmol/L (ref 3.5–5.1)
Sodium: 136 mmol/L (ref 135–145)
Total Bilirubin: 0.6 mg/dL (ref 0.3–1.2)
Total Protein: 5.9 g/dL — ABNORMAL LOW (ref 6.5–8.1)

## 2020-01-08 LAB — SARS CORONAVIRUS 2 BY RT PCR (HOSPITAL ORDER, PERFORMED IN ~~LOC~~ HOSPITAL LAB): SARS Coronavirus 2: NEGATIVE

## 2020-01-08 NOTE — ED Notes (Signed)
Per [facility] SW, [place] is reviewing pt - Requesting updated COVID, CBC, and CMET. Also requesting EKG - message sent to B Henderly, APP.

## 2020-01-08 NOTE — Care Management (Signed)
Accepted to [facility].  The patient is able to come to whenever transportation can be arranged.  The number to give report is [phone] The accepting is Dr. Henreitta Cea  Room number 424A  Writer informed the RN working with the patient

## 2020-01-08 NOTE — ED Notes (Signed)
Pt talking with sitter, eating breakfast

## 2020-01-08 NOTE — ED Provider Notes (Signed)
Blood pressure 133/83, pulse 73, temperature 98 F (36.7 C), temperature source Oral, resp. rate 20, height 5\' 6"  (1.676 m), weight 64 kg, SpO2 96 %.  In short, [name] is a 84 y.o. female with a chief complaint of IVC and Psychiatric Evaluation .  Refer to the original H&P for additional details.  04:58 PM  Patient accepted to [facility] with Geriatric Psychiatry. Accepting MD is Dr. [facility].   Patient stable for transfer. Will completed EMTALA.     Henreitta Cea, MD 01/08/20 1659

## 2020-01-08 NOTE — Progress Notes (Addendum)
CSW received phone call from [facility] in admissions at [facility]. They are reviewing pt and are requesting updated labs (CBC, metabolic panel), EKG, Covid test and chest x-ray. Becky at [facility] ED notified.   Wells Guiles, LCSW, LCAS Disposition CSW [facility]/TTS [phone]   UPDATE  1:35pm: All requested results have been faxed to [facility] for review except for Covid test results.

## 2020-01-08 NOTE — ED Provider Notes (Signed)
Emergency Medicine Observation Re-evaluation Note  [name] is a 84 y.o. female, seen on rounds today.  Pt initially presented to the ED for complaints of IVC and Psychiatric Evaluation Currently, the patient is pleasantly confused.  No complaints  Physical Exam  BP 133/83 (BP Location: Right Arm)   Pulse 73   Temp 98 F (36.7 C) (Oral)   Resp 20   Ht 5\' 6"  (1.676 m)   Wt 64 kg   SpO2 96%   BMI 22.77 kg/m  Physical Exam Vitals and nursing note reviewed.  Constitutional:      General: She is not in acute distress.    Appearance: She is well-developed. She is not ill-appearing, toxic-appearing or diaphoretic.  HENT:     Head: Atraumatic.  Eyes:     Pupils: Pupils are equal, round, and reactive to light.  Cardiovascular:     Rate and Rhythm: Normal rate and regular rhythm.  Pulmonary:     Effort: Pulmonary effort is normal. No respiratory distress.  Abdominal:     General: There is no distension.     Palpations: Abdomen is soft.  Musculoskeletal:        General: Normal range of motion.     Cervical back: Normal range of motion and neck supple.  Skin:    General: Skin is warm and dry.  Neurological:     Mental Status: She is alert.     Cranial Nerves: Cranial nerves are intact.     Sensory: Sensation is intact.     Motor: Motor function is intact.     Coordination: Coordination is intact.     Gait: Gait is intact.    ED Course / MDM  EKG:EKG Interpretation  Date/Time:  Tuesday January 08 2020 11:56:29 EDT Ventricular Rate:  77 PR Interval:    QRS Duration: 94 QT Interval:  386 QTC Calculation: 437 R Axis:   57 Text Interpretation: Sinus rhythm Atrial premature complex Probable left atrial enlargement Probable left ventricular hypertrophy Confirmed by [id number] (Geoffery Lyons) on 01/08/2020 12:44:58 PM  Clinical Course as of Jan 07 1505  Fri Jan 04, 2020  2254 SpO2: 97 % [AP]    Clinical Course User Index [AP] Jan 06, 2020, MD   I have reviewed the labs  performed to date as well as medications administered while in observation.  Recent changes in the last 24 hours include, patient being reviewed by [facility] psych.  Requesting updated labs, EKG and chest x-ray prior to acceptance and facility. Plan  Current plan is to continue finding inpatient facility.  Apparently yesterday they had noticed some gait issues with patient.  Patient had stated that she is due for a left hip replacement in August and then she has chronic left hip pain and this is the reason why she has abnormalities walking.  No cranial nerve deficits, equal strength bilaterally, low suspicion for CVA.  No overlying skin changes to suggest infectious process  Geri psych at [facility] did request repeat labs, chest x-ray, Covid and EKG prior to excepting patient.  I have placed orders for these labs. Patient is under full IVC at this time.  Labs, chest x-ray, EKG personally viewed and interpreted which does not have any acute findings.  Patient will continue to board until placement is found.   Adama Ferber A, PA-C 01/08/20 1507    01/10/20, MD 01/16/20 [phone]

## 2020-01-09 ENCOUNTER — Ambulatory Visit: Payer: Medicare PPO | Admitting: Neurology

## 2020-01-09 DIAGNOSIS — H9190 Unspecified hearing loss, unspecified ear: Secondary | ICD-10-CM | POA: Insufficient documentation

## 2020-01-09 DIAGNOSIS — Z8673 Personal history of transient ischemic attack (TIA), and cerebral infarction without residual deficits: Secondary | ICD-10-CM | POA: Insufficient documentation

## 2020-01-09 DIAGNOSIS — R7303 Prediabetes: Secondary | ICD-10-CM | POA: Insufficient documentation

## 2020-01-09 DIAGNOSIS — Z8619 Personal history of other infectious and parasitic diseases: Secondary | ICD-10-CM | POA: Insufficient documentation

## 2020-01-09 DIAGNOSIS — M159 Polyosteoarthritis, unspecified: Secondary | ICD-10-CM | POA: Insufficient documentation

## 2020-01-09 DIAGNOSIS — I872 Venous insufficiency (chronic) (peripheral): Secondary | ICD-10-CM | POA: Insufficient documentation

## 2020-01-09 DIAGNOSIS — K5900 Constipation, unspecified: Secondary | ICD-10-CM | POA: Insufficient documentation

## 2020-01-09 DIAGNOSIS — D649 Anemia, unspecified: Secondary | ICD-10-CM | POA: Insufficient documentation

## 2020-01-09 DIAGNOSIS — M8949 Other hypertrophic osteoarthropathy, multiple sites: Secondary | ICD-10-CM | POA: Insufficient documentation

## 2020-01-09 DIAGNOSIS — E782 Mixed hyperlipidemia: Secondary | ICD-10-CM | POA: Insufficient documentation

## 2020-01-09 DIAGNOSIS — E538 Deficiency of other specified B group vitamins: Secondary | ICD-10-CM | POA: Insufficient documentation

## 2020-01-22 ENCOUNTER — Inpatient Hospital Stay (HOSPITAL_COMMUNITY): Admission: RE | Admit: 2020-01-22 | Payer: Medicare PPO | Source: Ambulatory Visit

## 2020-01-25 ENCOUNTER — Other Ambulatory Visit: Payer: Medicare PPO

## 2020-01-28 ENCOUNTER — Encounter: Admission: RE | Payer: Self-pay | Source: Home / Self Care

## 2020-01-28 ENCOUNTER — Ambulatory Visit: Admission: RE | Admit: 2020-01-28 | Payer: Medicare PPO | Source: Home / Self Care | Admitting: Orthopedic Surgery

## 2020-01-28 SURGERY — ARTHROPLASTY, HIP, TOTAL, ANTERIOR APPROACH
Anesthesia: Spinal | Site: Hip | Laterality: Left

## 2020-01-30 ENCOUNTER — Telehealth: Payer: Self-pay

## 2020-01-30 ENCOUNTER — Other Ambulatory Visit: Payer: Self-pay | Admitting: Orthopedic Surgery

## 2020-01-30 NOTE — Telephone Encounter (Signed)
Lurena Joiner from [facility] called to see if we received a fax for patients surgical clearance. Received fax and placed in Dr. Rosalyn Gess box to be reviewed and signed. Patient has an appt with Dr. Karel Jarvis scheduled for 02/04/20.

## 2020-01-30 NOTE — Patient Instructions (Addendum)
DUE TO COVID-19 ONLY ONE VISITOR IS ALLOWED TO COME WITH YOU AND STAY IN THE WAITING ROOM ONLY  DURING PRE OP  AND PROCEDURE.   IF YOU WILL BE ADMITTED INTO THE HOSPITAL YOU ARE ALLOWED ONE SUPPORT PERSON DURING VISITATION  HOURS ONLY  (10AM -8PM)   . The support person may change daily. . The support person must pass our screening, gel in and out, and wear a mask at all times, including in the patient's room. . Patients must also wear a mask when staff or their support person are in the room.   COVID SWAB TESTING MUST BE COMPLETED ON:  Thursday, 02-07-2020 @ 11:05 AM    [address] [place], [place] [address]  (Must self quarantine after testing. Follow instructions on handout.)        Your procedure is scheduled on:  Monday, 02-11-2020   Report to [facility] Main  Entrance      Report to admitting at 8:45 AM   Call this number if you have problems the morning of surgery [phone]   Do not eat food :After Midnight.   May have liquids until 8:15 AM  day of surgery   CLEAR LIQUID DIET  Foods Allowed                                                                     Foods Excluded  Water, Black Coffee and tea, regular and decaf              liquids that you cannot  Plain Jell-O in any flavor  (No red)                                     see through such as: Fruit ices (not with fruit pulp)                                      milk, soups, orange juice              Iced Popsicles (No red)                                      All solid food                                   Apple juices Sports drinks like Gatorade (No red) Lightly seasoned clear broth or consume(fat free) Sugar, honey syrup     Complete one Ensure drink the morning of surgery at  8:15 AM  the day of surgery.     Oral Hygiene is also important to reduce your risk of infection.                                   Remember - BRUSH YOUR TEETH THE MORNING OF SURGERY WITH YOUR REGULAR TOOTHPASTE   Do  NOT smoke after Midnight   Take these medicines the morning of surgery with A SIP OF WATER:  Gabapentin, Levothyroxiine, Memantine, Metoprolol,  Sertraline                               You may not have any metal on your body including hair pins, jewelry, and body piercings             Do not wear make-up, lotions, powders, perfumes/cologne, or deodorant             Do not wear nail polish.  Do not shave  48 hours prior to surgery.           Do not bring valuables to the hospital. [facility] IS NOT RESPONSIBLE   FOR VALUABLES.   Contacts, dentures or bridgework may not be worn into surgery.   Bring small overnight bag day of surgery.       Special Instructions: Bring a copy of your healthcare power of attorney and living will documents the day of surgery if you haven't  scanned them in before.              Please read over the following fact sheets you were given: IF YOU HAVE QUESTIONS ABOUT YOUR PRE OP INSTRUCTIONS PLEASE  CALL [phone]   [facility] - Preparing for Surgery Before surgery, you can play an important role.  Because skin is not sterile, your skin needs to be as free of germs as possible.  You can reduce the number of germs on your skin by washing with CHG (chlorahexidine gluconate) soap before surgery.  CHG is an antiseptic cleaner which kills germs and bonds with the skin to continue killing germs even after washing. Please DO NOT use if you have an allergy to CHG or antibacterial soaps.  If your skin becomes reddened/irritated stop using the CHG and inform your nurse when you arrive at Short Stay. Do not shave (including legs and underarms) for at least 48 hours prior to the first CHG shower.  You may shave your face/neck.  Please follow these instructions carefully:  1.  Shower with CHG Soap the night before surgery and the  morning of surgery.  2.  If you choose to wash your hair, wash your hair first as usual with your normal  shampoo.  3.  After you shampoo,  rinse your hair and body thoroughly to remove the shampoo.                             4.  Use CHG as you would any other liquid soap.  You can apply chg directly to the skin and wash.  Gently with a scrungie or clean washcloth.  5.  Apply the CHG Soap to your body ONLY FROM THE NECK DOWN.   Do   not use on face/ open                           Wound or open sores. Avoid contact with eyes, ears mouth and   genitals (private parts).                       Wash face,  Genitals (private parts) with your normal soap.             6.  Wash  thoroughly, paying special attention to the area where your    surgery  will be performed.  7.  Thoroughly rinse your body with warm water from the neck down.  8.  DO NOT shower/wash with your normal soap after using and rinsing off the CHG Soap.                9.  Pat yourself dry with a clean towel.            10.  Wear clean pajamas.            11.  Place clean sheets on your bed the night of your first shower and do not  sleep with pets. Day of Surgery : Do not apply any lotions/deodorants the morning of surgery.  Please wear clean clothes to the hospital/surgery center.  FAILURE TO FOLLOW THESE INSTRUCTIONS MAY RESULT IN THE CANCELLATION OF YOUR SURGERY  PATIENT SIGNATURE_________________________________  NURSE SIGNATURE__________________________________  ________________________________________________________________________   [name]  An incentive spirometer is a tool that can help keep your lungs clear and active. This tool measures how well you are filling your lungs with each breath. Taking long deep breaths may help reverse or decrease the chance of developing breathing (pulmonary) problems (especially infection) following:  A long period of time when you are unable to move or be active. BEFORE THE PROCEDURE   If the spirometer includes an indicator to show your best effort, your nurse or respiratory therapist will set it to a desired  goal.  If possible, sit up straight or lean slightly forward. Try not to slouch.  Hold the incentive spirometer in an upright position. INSTRUCTIONS FOR USE  1. Sit on the edge of your bed if possible, or sit up as far as you can in bed or on a chair. 2. Hold the incentive spirometer in an upright position. 3. Breathe out normally. 4. Place the mouthpiece in your mouth and seal your lips tightly around it. 5. Breathe in slowly and as deeply as possible, raising the piston or the ball toward the top of the column. 6. Hold your breath for 3-5 seconds or for as long as possible. Allow the piston or ball to fall to the bottom of the column. 7. Remove the mouthpiece from your mouth and breathe out normally. 8. Rest for a few seconds and repeat Steps 1 through 7 at least 10 times every 1-2 hours when you are awake. Take your time and take a few normal breaths between deep breaths. 9. The spirometer may include an indicator to show your best effort. Use the indicator as a goal to work toward during each repetition. 10. After each set of 10 deep breaths, practice coughing to be sure your lungs are clear. If you have an incision (the cut made at the time of surgery), support your incision when coughing by placing a pillow or rolled up towels firmly against it. Once you are able to get out of bed, walk around indoors and cough well. You may stop using the incentive spirometer when instructed by your caregiver.  RISKS AND COMPLICATIONS  Take your time so you do not get dizzy or light-headed.  If you are in pain, you may need to take or ask for pain medication before doing incentive spirometry. It is harder to take a deep breath if you are having pain. AFTER USE  Rest and breathe slowly and easily.  It can be helpful to keep track of a log of your  progress. Your caregiver can provide you with a simple table to help with this. If you are using the spirometer at home, follow these instructions: SEEK  MEDICAL CARE IF:   You are having difficultly using the spirometer.  You have trouble using the spirometer as often as instructed.  Your pain medication is not giving enough relief while using the spirometer.  You develop fever of 100.5 F (38.1 C) or higher. SEEK IMMEDIATE MEDICAL CARE IF:   You cough up bloody sputum that had not been present before.  You develop fever of 102 F (38.9 C) or greater.  You develop worsening pain at or near the incision site. MAKE SURE YOU:   Understand these instructions.  Will watch your condition.  Will get help right away if you are not doing well or get worse. Document Released: 10/18/2006 Document Revised: 08/30/2011 Document Reviewed: 12/19/2006 Exit[employer] Patient Information 2014 [employer], [place].   ________________________________________________________________________  WHAT IS A BLOOD TRANSFUSION? Blood Transfusion Information  A transfusion is the replacement of blood or some of its parts. Blood is made up of multiple cells which provide different functions.  Red blood cells carry oxygen and are used for blood loss replacement.  White blood cells fight against infection.  Platelets control bleeding.  Plasma helps clot blood.  Other blood products are available for specialized needs, such as hemophilia or other clotting disorders. BEFORE THE TRANSFUSION  Who gives blood for transfusions?   Healthy volunteers who are fully evaluated to make sure their blood is safe. This is blood bank blood. Transfusion therapy is the safest it has ever been in the practice of medicine. Before blood is taken from a donor, a complete history is taken to make sure that person has no history of diseases nor engages in risky social behavior (examples are intravenous drug use or sexual activity with multiple partners). The donor's travel history is screened to minimize risk of transmitting infections, such as malaria. The donated blood is tested for  signs of infectious diseases, such as HIV and hepatitis. The blood is then tested to be sure it is compatible with you in order to minimize the chance of a transfusion reaction. If you or a relative donates blood, this is often done in anticipation of surgery and is not appropriate for emergency situations. It takes many days to process the donated blood. RISKS AND COMPLICATIONS Although transfusion therapy is very safe and saves many lives, the main dangers of transfusion include:   Getting an infectious disease.  Developing a transfusion reaction. This is an allergic reaction to something in the blood you were given. Every precaution is taken to prevent this. The decision to have a blood transfusion has been considered carefully by your caregiver before blood is given. Blood is not given unless the benefits outweigh the risks. AFTER THE TRANSFUSION  Right after receiving a blood transfusion, you will usually feel much better and more energetic. This is especially true if your red blood cells have gotten low (anemic). The transfusion raises the level of the red blood cells which carry oxygen, and this usually causes an energy increase.  The nurse administering the transfusion will monitor you carefully for complications. HOME CARE INSTRUCTIONS  No special instructions are needed after a transfusion. You may find your energy is better. Speak with your caregiver about any limitations on activity for underlying diseases you may have. SEEK MEDICAL CARE IF:   Your condition is not improving after your transfusion.  You develop redness or  irritation at the intravenous (IV) site. SEEK IMMEDIATE MEDICAL CARE IF:  Any of the following symptoms occur over the next 12 hours:  Shaking chills.  You have a temperature by mouth above 102 F (38.9 C), not controlled by medicine.  Chest, back, or muscle pain.  People around you feel you are not acting correctly or are confused.  Shortness of breath  or difficulty breathing.  Dizziness and fainting.  You get a rash or develop hives.  You have a decrease in urine output.  Your urine turns a dark color or changes to pink, red, or brown. Any of the following symptoms occur over the next 10 days:  You have a temperature by mouth above 102 F (38.9 C), not controlled by medicine.  Shortness of breath.  Weakness after normal activity.  The white part of the eye turns yellow (jaundice).  You have a decrease in the amount of urine or are urinating less often.  Your urine turns a dark color or changes to pink, red, or brown. Document Released: 06/04/2000 Document Revised: 08/30/2011 Document Reviewed: 01/22/2008 [facility] Patient Information 2014 [place], [place].  _______________________________________________________________________

## 2020-01-30 NOTE — Progress Notes (Addendum)
COVID Vaccine Completed: No Date COVID Vaccine completed: COVID vaccine manufacturer: [employer]   PCP - [name], MD Cardiologist -   Chest x-ray - 01-08-20 in Epic EKG -   01-09-20 in Epic Stress Test -  ECHO -  Cardiac Cath -   Sleep Study -  CPAP -   Fasting Blood Sugar -  Checks Blood Sugar _____ times a day  Blood Thinner Instructions: Aspirin Instructions: ASA 81 mg Last Dose:  Anesthesia review:  Patient denies shortness of breath, fever, cough and chest pain at PAT appointment   Patient verbalized understanding of instructions that were given to them at the PAT appointment. Patient was also instructed that they will need to review over the PAT instructions again at home before surgery.

## 2020-01-30 NOTE — Telephone Encounter (Signed)
Pls let them know that since her last visit with me, she was in the hospital for psychosis. I have not seen her since and she has a f/u with me on 8/16, will evaluate her then. Thanks

## 2020-01-31 NOTE — Telephone Encounter (Signed)
Called Lurena Joiner and left a message that Dr. Karel Jarvis will evaluate patient at her upcoming follow up.

## 2020-02-04 ENCOUNTER — Other Ambulatory Visit: Payer: Self-pay

## 2020-02-04 ENCOUNTER — Ambulatory Visit: Payer: Medicare PPO | Admitting: Neurology

## 2020-02-04 ENCOUNTER — Encounter: Payer: Self-pay | Admitting: Neurology

## 2020-02-04 VITALS — BP 121/78 | HR 77 | Ht 66.0 in | Wt 142.0 lb

## 2020-02-04 DIAGNOSIS — F0281 Dementia in other diseases classified elsewhere with behavioral disturbance: Secondary | ICD-10-CM | POA: Diagnosis not present

## 2020-02-04 DIAGNOSIS — B009 Herpesviral infection, unspecified: Secondary | ICD-10-CM | POA: Insufficient documentation

## 2020-02-04 DIAGNOSIS — G4700 Insomnia, unspecified: Secondary | ICD-10-CM | POA: Insufficient documentation

## 2020-02-04 DIAGNOSIS — F329 Major depressive disorder, single episode, unspecified: Secondary | ICD-10-CM | POA: Insufficient documentation

## 2020-02-04 DIAGNOSIS — I10 Essential (primary) hypertension: Secondary | ICD-10-CM | POA: Insufficient documentation

## 2020-02-04 DIAGNOSIS — F419 Anxiety disorder, unspecified: Secondary | ICD-10-CM | POA: Insufficient documentation

## 2020-02-04 DIAGNOSIS — G301 Alzheimer's disease with late onset: Secondary | ICD-10-CM | POA: Diagnosis not present

## 2020-02-04 DIAGNOSIS — I639 Cerebral infarction, unspecified: Secondary | ICD-10-CM | POA: Insufficient documentation

## 2020-02-04 DIAGNOSIS — E559 Vitamin D deficiency, unspecified: Secondary | ICD-10-CM | POA: Insufficient documentation

## 2020-02-04 DIAGNOSIS — F32A Depression, unspecified: Secondary | ICD-10-CM | POA: Insufficient documentation

## 2020-02-04 MED ORDER — MEMANTINE HCL 5 MG PO TABS: 5.0000 mg | ORAL_TABLET | Freq: Every day | ORAL | 0 refills | Status: DC

## 2020-02-04 MED ORDER — TRAZODONE HCL 50 MG PO TABS: 50.0000 mg | ORAL_TABLET | Freq: Every day | ORAL | 3 refills | Status: DC

## 2020-02-04 MED ORDER — TRAZODONE HCL 50 MG PO TABS: 50.0000 mg | ORAL_TABLET | Freq: Every day | ORAL | 0 refills | Status: DC

## 2020-02-04 MED ORDER — SERTRALINE HCL 50 MG PO TABS: 50.0000 mg | ORAL_TABLET | Freq: Every day | ORAL | 3 refills | Status: DC

## 2020-02-04 MED ORDER — GABAPENTIN 100 MG PO CAPS
ORAL_CAPSULE | ORAL | 3 refills | Status: DC
Start: 2020-02-04 — End: 2020-02-11

## 2020-02-04 MED ORDER — MEMANTINE HCL 5 MG PO TABS: 5.0000 mg | ORAL_TABLET | Freq: Every day | ORAL | 3 refills | Status: DC

## 2020-02-04 MED ORDER — OLANZAPINE 10 MG PO TABS: 10.0000 mg | ORAL_TABLET | Freq: Every day | ORAL | 3 refills | Status: DC

## 2020-02-04 NOTE — Patient Instructions (Signed)
Good to see you are feeling better. Reduce gabapentin: take 100mg  in AM, 200mg  in PM. Continue all your other medications. Follow-up in 4 months, call for any changes   FALL PRECAUTIONS: Be cautious when walking. Scan the area for obstacles that may increase the risk of trips and falls. When getting up in the mornings, sit up at the edge of the bed for a few minutes before getting out of bed. Consider elevating the bed at the head end to avoid drop of blood pressure when getting up. Walk always in a well-lit room (use night lights in the walls). Avoid area rugs or power cords from appliances in the middle of the walkways. Use a walker or a cane if necessary and consider physical therapy for balance exercise. Get your eyesight checked regularly.  HOME SAFETY: Consider the safety of the kitchen when operating appliances like stoves, microwave oven, and blender. Consider having supervision and share cooking responsibilities until no longer able to participate in those. Accidents with firearms and other hazards in the house should be identified and addressed as well.  ABILITY TO BE LEFT ALONE: If patient is unable to contact 911 operator, consider using LifeLine, or when the need is there, arrange for someone to stay with patients. Smoking is a fire hazard, consider supervision or cessation. Risk of wandering should be assessed by caregiver and if detected at any point, supervision and safe proof recommendations should be instituted.  RECOMMENDATIONS FOR ALL PATIENTS WITH MEMORY PROBLEMS: 1. Continue to exercise (Recommend 30 minutes of walking everyday, or 3 hours every week) 2. Increase social interactions - continue going to [place] and enjoy social gatherings with friends and family 3. Eat healthy, avoid fried foods and eat more fruits and vegetables 4. Maintain adequate blood pressure, blood sugar, and blood cholesterol level. Reducing the risk of stroke and cardiovascular disease also helps promoting  better memory. 5. Avoid stressful situations. Live a simple life and avoid aggravations. Organize your time and prepare for the next day in anticipation. 6. Sleep well, avoid any interruptions of sleep and avoid any distractions in the bedroom that may interfere with adequate sleep quality 7. Avoid sugar, avoid sweets as there is a strong link between excessive sugar intake, diabetes, and cognitive impairment The Mediterranean diet has been shown to help patients reduce the risk of progressive memory disorders and reduces cardiovascular risk. This includes eating fish, eat fruits and green leafy vegetables, nuts like almonds and hazelnuts, walnuts, and also use olive oil. Avoid fast foods and fried foods as much as possible. Avoid sweets and sugar as sugar use has been linked to worsening of memory function.  There is always a concern of gradual progression of memory problems. If this is the case, then we may need to adjust level of care according to patient needs. Support, both to the patient and caregiver, should then be put into place.

## 2020-02-04 NOTE — Progress Notes (Signed)
NEUROLOGY FOLLOW UP OFFICE NOTE  [name] [id number] 1936/02/28  HISTORY OF PRESENT ILLNESS: I had the pleasure of seeing [name] in follow-up in the neurology clinic on 02/04/2020.  The patient was last seen a month ago for dementia. She is again accompanied by her granddaughter who helps supplement the history today. Since her last visit, she was brought to the ER on 01/04/20 for worsening behaviors with auditory and visual hallucinations about babies crying while accusing family of killing the crying babies. She would run out of the house at all times at night. The police was in their home when she drove off without her phone. An IVC had to be issued so police could get her. She was brought to the ER where bloodwork was unremarkable. UA showed moderate leukocytes, 6-10 WBC, culture negative. UDS negative. She had an MRI brain on 01/05/20 which did not show any acute changes. There was diffuse cerebral atrophy, moderate chronic microvascular disease. She was admitted to [name] from 7/20 to 8/4. She was started on gabapentin 150mg  every 8 hours (250mg /17mL taking 3 mLs TID), Memantine 5mg  daily, olanzapine 10mg  qhs, Sertraline 50mg  daily, and Trazodone 50mg  qhs. Donepezil was stopped since she had significant worsening the day she started it. She has had significant improvement since hospital discharge. No further hallucinations or delusions. She does not remember much of the hospitalization but recalls liking group activities at [name]. She denies any headaches, dizziness, vision changes, focal numbness/tingling/weakness. She is not driving, they sold her car. 4m manages medications and finances. She has hip surgery scheduled this week and has not been very mobile, is not sure if she is drowsy from the medications or from inactivity, but she denies any drowsiness currently after taking her noon gabapentin dose.    History on Initial Assessment 01/01/2020: This  is a pleasant 84 year old right-handed woman with a history of hypertension, hyperlipidemia, prior stroke with no residual deficits, presenting for evaluation of auditory hallucinations and memory loss. She states her memory "comes and goes." She lives with her granddaughter and daughter-in-law [name]. [name] started noticing memory changes the last couple of years where she would be repeating herself. Recently, memory has worsened, she had 2 appointments in one day and did not remember the afternoon appointment. They have had to write down notes more. She manages her own medications and [name] has not noticed any issues with forgetting medications. She used to live with her husband then he passed away and she lived alone in [name] for 9 months, before she moved in with [name] 2 years ago. She was not missing any bill payments while alone, bills were switched to autopay 2 years ago. She continues to drive around [name] and denies getting lost. She was getting lost in [name], but they feel it was due to being in an unfamiliar place. [name] started noticing auditory hallucinations while she was still living alone, she would say something was not right with the air, or the doors, so [name] moved her. She was saying the she would hear cars at night or someone trying to break in, barricading her door. She would say some boys were drinking beside her condo or she had mice in the condo and put traps out but never caught any. Since moving in with [name] 2 years ago, she continued to have hallucinations, mostly at night initially, saying something was in the bed with her. Hallucinations have progressively worsened the past few months, also occurring  in the daytime. She does not remember what she hears at night, [name] would remind her that she tells [name] she hears her granddaughter calling her "[name]" at night or during the day when the granddaughter is not home. She denies any visual hallucinations, however  [name] reminds her the other day she thought she saw someone getting in the window, she heard things outside and said they were packing things into delivery trucks. She saw arms lifting things up, loading trucks up. She saw a shadow come back the other day on the 2nd level window. She would be afraid he would get a hold of the family.  She would be up all night, knocking on their doors telling them about the hallucinations. Melatonin did not help. Family brought her to the ER a week ago, CBC, CMP, urinalysis unremarkable except for anemia. I personally reviewed head CT without contrast which did not show any acute changes, there was diffuse cerebral and cerebellar atrophy, moderately extensive chronic microvascular disease. They were instructed to give Tylenol PM to help her sleep at night, she has been taking 2 tabs qhs and has been sleeping all night with this since then. No REM behavior disorder noted. [name] denies any prior psychiatric history. No hygiene concerns, she is able to bathe and dress independently. Her mother and father had Alzheimer's disease. No history of significant head injuries. She has not had any alcohol in a while.   She denies any headaches, dizziness, diplopia, dysarthria/dysphagia, neck/back pain, focal numbness/tingling/weakness, bowel/bladder dysfunction, anosmia, or tremors. Mood comes and goes, "but not bad." Her last fall was in October 2020 when she tripped. She is scheduled for left hip replacement on 01/28/20.    PAST MEDICAL HISTORY: Past Medical History:  Diagnosis Date  . Hypertension   . Stroke (HCC)   . Thyroid disease     MEDICATIONS: Current Outpatient Medications on File Prior to Visit  Medication Sig Dispense Refill  . acetaminophen (TYLENOL) 500 MG tablet Take 500 mg by mouth every 6 (six) hours as needed for mild pain, moderate pain or headache. (Patient not taking: Reported on 01/29/2020)    . aspirin EC 81 MG tablet Take 81 mg by mouth daily. Swallow  whole.    . donepezil (ARICEPT) 10 MG tablet Take 1/2 tablet daily for 2 weeks, then increase to 1 tablet daily (Patient not taking: Reported on 01/04/2020) 30 tablet 11  . gabapentin (NEURONTIN) 250 MG/5ML solution Take 150 mg by mouth in the morning, at noon, and at bedtime.    Marland Kitchen levothyroxine (SYNTHROID) 50 MCG tablet Take 50 mcg by mouth daily before breakfast.    . lisinopril (ZESTRIL) 5 MG tablet Take 5 mg by mouth daily.    . metoprolol succinate (TOPROL-XL) 25 MG 24 hr tablet Take 25 mg by mouth daily.    Marland Kitchen OLANZapine (ZYPREXA) 10 MG tablet Take 10 mg by mouth at bedtime.    . sertraline (ZOLOFT) 50 MG tablet Take 50 mg by mouth daily.    . simvastatin (ZOCOR) 20 MG tablet Take 20 mg by mouth at bedtime.      No current facility-administered medications on file prior to visit.    ALLERGIES: Allergies  Allergen Reactions  . Donepezil Other (See Comments)    POSSIBLE (??) Hallucinations and sleep disturbances    FAMILY HISTORY: No family history on file.  SOCIAL HISTORY: Social History   Socioeconomic History  . Marital status: Widowed    Spouse name: Not on file  .  Number of children: Not on file  . Years of education: Not on file  . Highest education level: Not on file  Occupational History  . Not on file  Tobacco Use  . Smoking status: Not on file  Substance and Sexual Activity  . Alcohol use: Not on file  . Drug use: Not on file  . Sexual activity: Not on file  Other Topics Concern  . Not on file  Social History Narrative  . Not on file   Social Determinants of Health   Financial Resource Strain:   . Difficulty of Paying Living Expenses:   Food Insecurity:   . Worried About Programme researcher, broadcasting/film/video in the Last Year:   . Barista in the Last Year:   Transportation Needs:   . Freight forwarder (Medical):   Marland Kitchen Lack of Transportation (Non-Medical):   Physical Activity:   . Days of Exercise per Week:   . Minutes of Exercise per Session:   Stress:    . Feeling of Stress :   Social Connections:   . Frequency of Communication with Friends and Family:   . Frequency of Social Gatherings with Friends and Family:   . Attends Religious Services:   . Active Member of Clubs or Organizations:   . Attends Banker Meetings:   Marland Kitchen Marital Status:   Intimate Partner Violence:   . Fear of Current or Ex-Partner:   . Emotionally Abused:   Marland Kitchen Physically Abused:   . Sexually Abused:     PHYSICAL EXAM: Vitals:   02/04/20 1524  BP: 121/78  Pulse: 77  SpO2: 96%   General: No acute distress Head:  Normocephalic/atraumatic Skin/Extremities: No rash, no edema Neurological Exam: alert and oriented to person, place, states it is August 2012. No aphasia or dysarthria. Fund of knowledge is reduced. Recent and remote memory are impaired. Attention and concentration are normal. Cranial nerves: Pupils equal, round. Extraocular movements intact with no nystagmus. Visual fields full. No facial asymmetry. Motor: Bulk and tone normal, muscle strength 5/5 throughout with no pronator drift.  Finger to nose testing intact.  Gait slow and cautious due to hip pain, no ataxia   IMPRESSION: This is a pleasant 84 yo RH woman with a history of hypertension, hyperlipidemia, prior stroke with no residual deficits, with likely Alzheimer's disease with behavioral changes. MRI brain no acute changes, there was diffuse cerebral atrophy, moderate chronic microvascular disease. She was recently admitted to inpatient Behavioral Health for psychosis with auditory and visual hallucinations. Symptoms have quieted down with initiation of multiple psychoactive medications. Refills sent for olanzapine 10mg  qhs, sertraline 50mg  daily, trazodone 50mg  qhs, Memantine 5mg  daily. Liquid formulation of gabapentin is cost-prohibitive and TID dosing is slightly difficult logistically. We will switch to gabapentin 100mg  in AM, 200mg  in PM (lower dose since she is already drowsy on qhs  medications). Continue close supervision. Family will be looking into ALF after her hip surgery. Follow-up in 4 months, they know to call for any changes.    Thank you for allowing me to participate in her care.  Please do not hesitate to call for any questions or concerns.   , M.D.   CC: Dr. 

## 2020-02-05 DIAGNOSIS — Z09 Encounter for follow-up examination after completed treatment for conditions other than malignant neoplasm: Secondary | ICD-10-CM | POA: Diagnosis not present

## 2020-02-05 DIAGNOSIS — I10 Essential (primary) hypertension: Secondary | ICD-10-CM | POA: Diagnosis not present

## 2020-02-05 DIAGNOSIS — F0390 Unspecified dementia without behavioral disturbance: Secondary | ICD-10-CM | POA: Diagnosis not present

## 2020-02-05 DIAGNOSIS — Z8673 Personal history of transient ischemic attack (TIA), and cerebral infarction without residual deficits: Secondary | ICD-10-CM | POA: Diagnosis not present

## 2020-02-06 ENCOUNTER — Other Ambulatory Visit: Payer: Self-pay

## 2020-02-06 ENCOUNTER — Encounter (HOSPITAL_COMMUNITY)
Admission: RE | Admit: 2020-02-06 | Discharge: 2020-02-06 | Disposition: A | Discharge status: Home / Self Care | Payer: Medicare PPO | Source: Ambulatory Visit | Attending: Orthopedic Surgery | Admitting: Orthopedic Surgery

## 2020-02-06 ENCOUNTER — Encounter (HOSPITAL_COMMUNITY): Payer: Self-pay

## 2020-02-06 DIAGNOSIS — Z01812 Encounter for preprocedural laboratory examination: Secondary | ICD-10-CM | POA: Diagnosis not present

## 2020-02-06 DIAGNOSIS — M1612 Unilateral primary osteoarthritis, left hip: Secondary | ICD-10-CM | POA: Diagnosis present

## 2020-02-06 HISTORY — DX: Unspecified osteoarthritis, unspecified site: M19.90

## 2020-02-06 HISTORY — DX: Anxiety disorder, unspecified: F41.9

## 2020-02-06 HISTORY — DX: Prediabetes: R73.03

## 2020-02-06 HISTORY — DX: Depression, unspecified: F32.A

## 2020-02-06 LAB — CBC WITH DIFFERENTIAL/PLATELET
Abs Immature Granulocytes: 0.01 10*3/uL (ref 0.00–0.07)
Basophils Absolute: 0 10*3/uL (ref 0.0–0.1)
Basophils Relative: 1 %
Eosinophils Absolute: 0.3 10*3/uL (ref 0.0–0.5)
Eosinophils Relative: 7 %
HCT: 37.1 % (ref 36.0–46.0)
Hemoglobin: 11.7 g/dL — ABNORMAL LOW (ref 12.0–15.0)
Immature Granulocytes: 0 %
Lymphocytes Relative: 15 %
Lymphs Abs: 0.6 10*3/uL — ABNORMAL LOW (ref 0.7–4.0)
MCH: 30.3 pg (ref 26.0–34.0)
MCHC: 31.5 g/dL (ref 30.0–36.0)
MCV: 96.1 fL (ref 80.0–100.0)
Monocytes Absolute: 0.3 10*3/uL (ref 0.1–1.0)
Monocytes Relative: 8 %
Neutro Abs: 3 10*3/uL (ref 1.7–7.7)
Neutrophils Relative %: 69 %
Platelets: 189 10*3/uL (ref 150–400)
RBC: 3.86 MIL/uL — ABNORMAL LOW (ref 3.87–5.11)
RDW: 13.2 % (ref 11.5–15.5)
WBC: 4.3 10*3/uL (ref 4.0–10.5)
nRBC: 0 % (ref 0.0–0.2)

## 2020-02-06 LAB — SURGICAL PCR SCREEN
MRSA, PCR: NEGATIVE
Staphylococcus aureus: NEGATIVE

## 2020-02-06 LAB — BASIC METABOLIC PANEL WITH GFR
Anion gap: 7 (ref 5–15)
BUN: 26 mg/dL — ABNORMAL HIGH (ref 8–23)
CO2: 27 mmol/L (ref 22–32)
Calcium: 8.7 mg/dL — ABNORMAL LOW (ref 8.9–10.3)
Chloride: 106 mmol/L (ref 98–111)
Creatinine, Ser: 0.86 mg/dL (ref 0.44–1.00)
GFR calc Af Amer: >60 mL/min
GFR calc non Af Amer: >60 mL/min
Glucose, Bld: 124 mg/dL — ABNORMAL HIGH (ref 70–99)
Potassium: 4 mmol/L (ref 3.5–5.1)
Sodium: 140 mmol/L (ref 135–145)

## 2020-02-06 LAB — URINALYSIS, ROUTINE W REFLEX MICROSCOPIC
Bilirubin Urine: NEGATIVE
Glucose, UA: NEGATIVE mg/dL
Hgb urine dipstick: NEGATIVE
Ketones, ur: NEGATIVE mg/dL
Nitrite: NEGATIVE
Protein, ur: NEGATIVE mg/dL
Specific Gravity, Urine: 1.017 (ref 1.005–1.030)
pH: 5 (ref 5.0–8.0)

## 2020-02-06 LAB — PROTIME-INR
INR: 1 (ref 0.8–1.2)
Prothrombin Time: 12.7 seconds (ref 11.4–15.2)

## 2020-02-06 LAB — APTT: aPTT: 33 s (ref 24–36)

## 2020-02-06 NOTE — Progress Notes (Signed)
BMP sent to Dr. Rowan to review.   

## 2020-02-06 NOTE — H&P (Signed)
TOTAL HIP ADMISSION H&P  Patient is admitted for left total hip arthroplasty.  Subjective:  Chief Complaint: left hip pain  HPI: [name], 84 y.o. female, has a history of pain and functional disability in the left hip(s) due to arthritis and patient has failed non-surgical conservative treatments for greater than 12 weeks to include NSAID's and/or analgesics, use of assistive devices and activity modification.  Onset of symptoms was gradual starting several years ago with gradually worsening course since that time.The patient noted no past surgery on the left hip(s).  Patient currently rates pain in the left hip at 10 out of 10 with activity. Patient has night pain, worsening of pain with activity and weight bearing, pain that interfers with activities of daily living and pain with passive range of motion. Patient has evidence of periarticular osteophytes and joint space narrowing by imaging studies. This condition presents safety issues increasing the risk of falls.  There is no current active infection.  Patient Active Problem List   Diagnosis Date Noted  . Osteoarthritis of left hip 02/06/2020  . Anxiety 02/04/2020  . CVA (cerebral vascular accident) (HCC) 02/04/2020  . Depression 02/04/2020  . Essential hypertension 02/04/2020  . HSV-2 (herpes simplex virus 2) infection 02/04/2020  . Insomnia 02/04/2020  . Vitamin D deficiency 02/04/2020  . Constipation 01/09/2020  . Hearing loss 01/09/2020  . Hx of completed stroke 01/09/2020  . Hx of herpes simplex infection 01/09/2020  . Mixed dyslipidemia 01/09/2020  . Normocytic anemia 01/09/2020  . Prediabetes 01/09/2020  . Primary osteoarthritis involving multiple joints 01/09/2020  . Venous insufficiency 01/09/2020  . Vitamin B12 deficiency 01/09/2020  . Dementia with behavioral disturbance (HCC) 01/08/2020  . Delusional thoughts (HCC) 01/06/2020  . Aggressive behavior   . Hallucinations   . Acquired hypothyroidism 03/18/2014  .  Mixed hyperlipidemia 03/18/2014   Past Medical History:  Diagnosis Date  . Anxiety   . Arthritis   . Depression   . Hypertension   . Pre-diabetes   . Stroke [facility])    6 years ago  . Thyroid disease     Past Surgical History:  Procedure Laterality Date  . ABDOMINAL HYSTERECTOMY    . CHOLECYSTECTOMY    . MULTIPLE TOOTH EXTRACTIONS    . ROTATOR CUFF REPAIR Left     No current facility-administered medications for this encounter.   Current Outpatient Medications  Medication Sig Dispense Refill Last Dose  . aspirin EC 81 MG tablet Take 81 mg by mouth daily. Swallow whole.     . levothyroxine (SYNTHROID) 50 MCG tablet Take 50 mcg by mouth daily before breakfast.     . lisinopril (ZESTRIL) 5 MG tablet Take 5 mg by mouth daily.     . metoprolol succinate (TOPROL-XL) 25 MG 24 hr tablet Take 25 mg by mouth daily.     . simvastatin (ZOCOR) 20 MG tablet Take 20 mg by mouth at bedtime.      Marland Kitchen acetaminophen (TYLENOL) 500 MG tablet Take 500 mg by mouth every 6 (six) hours as needed for mild pain, moderate pain or headache.    Not Taking at Unknown time  . gabapentin (NEURONTIN) 100 MG capsule Take 1 cap in AM, 2 caps in PM 270 capsule 3   . memantine (NAMENDA) 5 MG tablet Take 1 tablet (5 mg total) by mouth daily. 90 tablet 3   . OLANZapine (ZYPREXA) 10 MG tablet Take 1 tablet (10 mg total) by mouth at bedtime. 90 tablet 3   .  sertraline (ZOLOFT) 50 MG tablet Take 1 tablet (50 mg total) by mouth daily. 90 tablet 3   . traZODone (DESYREL) 50 MG tablet Take 1 tablet (50 mg total) by mouth at bedtime. 90 tablet 3    Allergies  Allergen Reactions  . Donepezil Other (See Comments)    POSSIBLE (??) Hallucinations and sleep disturbances    Social History   Tobacco Use  . Smoking status: Never Smoker  . Smokeless tobacco: Never Used  Substance Use Topics  . Alcohol use: Never    No family history on file.   Review of Systems  Constitutional: Negative.   HENT: Negative.   Eyes: Negative.    Respiratory: Negative.   Cardiovascular:       HTN  Gastrointestinal: Positive for constipation.  Genitourinary: Negative.   Musculoskeletal: Positive for arthralgias and myalgias.  Allergic/Immunologic: Negative.   Neurological: Negative.   Hematological: Negative.   Psychiatric/Behavioral: Negative.     Objective:  Physical Exam Constitutional:      Appearance: Normal appearance. She is normal weight.  HENT:     Head: Normocephalic and atraumatic.     Nose: Nose normal.  Eyes:     Pupils: Pupils are equal, round, and reactive to light.  Cardiovascular:     Pulses: Normal pulses.  Pulmonary:     Effort: Pulmonary effort is normal.  Musculoskeletal:     Cervical back: Normal range of motion and neck supple.     Comments: No signs of infection Patient denies tenderness to palpation.  Patient has pain with internal and external rotation of both hips left worse than right.  Left hip had limited rotation.  She denied pain with straight leg raise bilaterally.  She is neurovascularly intact in the distal lower extremities bilaterally.  Neurological:     General: No focal deficit present.     Mental Status: She is alert and oriented to person, place, and time. Mental status is at baseline.  Psychiatric:        Mood and Affect: Mood normal.        Behavior: Behavior normal.        Thought Content: Thought content normal.        Judgment: Judgment normal.     Vital signs in last 24 hours: Temp:  [98.2 F (36.8 C)] 98.2 F (36.8 C) (08/18 0823) Pulse Rate:  [70] 70 (08/18 0823) Resp:  [16] 16 (08/18 0823) BP: (115)/(71) 115/71 (08/18 0823) SpO2:  [100 %] 100 % (08/18 0823) Weight:  [65.8 kg] 65.8 kg (08/18 0823)  Labs:   Estimated body mass index is 23.4 kg/m as calculated from the following:   Height as of 02/06/20: 5\' 6"  (1.676 m).   Weight as of 02/06/20: 65.8 kg.   Imaging Review Plain radiographs demonstrate AP and lateral views of the hips showed bone-on-bone  end-stage arthritis in the left hip with osteophyte formation, decreased articular cartilage was also noted in the right hip consistent with progressing osteoarthritis.  No acute injuries or fractures noted.    Assessment/Plan:  End stage arthritis, left hip(s)  The patient history, physical examination, clinical judgement of the provider and imaging studies are consistent with end stage degenerative joint disease of the left hip(s) and total hip arthroplasty is deemed medically necessary. The treatment options including medical management, injection therapy, arthroscopy and arthroplasty were discussed at length. The risks and benefits of total hip arthroplasty were presented and reviewed. The risks due to aseptic loosening, infection, stiffness, dislocation/subluxation,  thromboembolic complications and other imponderables were discussed.  The patient acknowledged the explanation, agreed to proceed with the plan and consent was signed. Patient is being admitted for inpatient treatment for surgery, pain control, PT, OT, prophylactic antibiotics, VTE prophylaxis, progressive ambulation and ADL's and discharge planning.The patient is planning to be discharged home with home health services   Anticipated LOS equal to or greater than 2 midnights due to - Age 65 and older with one or more of the following:  - Obesity  - Expected need for hospital services (PT, OT, Nursing) required for safe  discharge  - Anticipated need for postoperative skilled nursing care or inpatient rehab  - Active co-morbidities: Stroke

## 2020-02-07 ENCOUNTER — Other Ambulatory Visit (HOSPITAL_COMMUNITY)
Admission: RE | Admit: 2020-02-07 | Discharge: 2020-02-07 | Disposition: A | Discharge status: Home / Self Care | Payer: Medicare PPO | Source: Ambulatory Visit | Attending: Orthopedic Surgery | Admitting: Orthopedic Surgery

## 2020-02-07 DIAGNOSIS — Z20822 Contact with and (suspected) exposure to covid-19: Secondary | ICD-10-CM | POA: Insufficient documentation

## 2020-02-07 DIAGNOSIS — Z01812 Encounter for preprocedural laboratory examination: Secondary | ICD-10-CM | POA: Insufficient documentation

## 2020-02-07 LAB — SARS CORONAVIRUS 2 (TAT 6-24 HRS): SARS Coronavirus 2: NEGATIVE

## 2020-02-09 DIAGNOSIS — U071 COVID-19: Secondary | ICD-10-CM | POA: Diagnosis not present

## 2020-02-11 ENCOUNTER — Ambulatory Visit (HOSPITAL_COMMUNITY): Admission: RE | Admit: 2020-02-11 | Payer: Medicare PPO | Source: Home / Self Care | Admitting: Orthopedic Surgery

## 2020-02-11 ENCOUNTER — Telehealth: Payer: Self-pay | Admitting: Neurology

## 2020-02-11 ENCOUNTER — Encounter (HOSPITAL_COMMUNITY): Admission: RE | Payer: Self-pay | Source: Home / Self Care

## 2020-02-11 DIAGNOSIS — M1612 Unilateral primary osteoarthritis, left hip: Secondary | ICD-10-CM | POA: Diagnosis present

## 2020-02-11 LAB — TYPE AND SCREEN
ABO/RH(D): A POS
Antibody Screen: NEGATIVE

## 2020-02-11 SURGERY — ARTHROPLASTY, HIP, TOTAL, ANTERIOR APPROACH
Anesthesia: Spinal | Site: Hip | Laterality: Left

## 2020-02-11 MED ORDER — OLANZAPINE 10 MG PO TABS: ORAL_TABLET | ORAL | 3 refills | Status: DC

## 2020-02-11 MED ORDER — TRAZODONE HCL 50 MG PO TABS: ORAL_TABLET | ORAL | 3 refills | Status: DC

## 2020-02-11 MED ORDER — GABAPENTIN 100 MG PO CAPS
ORAL_CAPSULE | ORAL | 3 refills | Status: DC
Start: 2020-02-11 — End: 2020-06-17

## 2020-02-11 NOTE — Telephone Encounter (Signed)
Patient's grand-daughter called and said, "I need to speak with a nurse. We are back at square 1 with the dementia. The medication only worked for about a month. But now nothing is working."  Therapist, occupational on [employer]

## 2020-02-11 NOTE — Telephone Encounter (Signed)
Spoke to [employer]. Tuesday night could see something was changing, asking a lot of questions. Then kept progressing on Wed until where they are today. She is in such a mind frame right now not wanting to let [name] give her meds.She also tested positive for Covid on Sat. She won't stay in her room or wear a mask.  We discussed adjusting medications but if symptoms progress, recommended inpatient [facility] again. She will try increasing gabapentin to 200mg  BID, Trazodone to 100mg  qhs, and give olanzapine 10mg  1/2 tab in AM 1 tab in PM. Also recommended starting to look into facilities so when COVID quarantine is over, they have a place to move her for higher level of care.

## 2020-02-16 ENCOUNTER — Emergency Department (HOSPITAL_COMMUNITY): Payer: Medicare PPO

## 2020-02-16 ENCOUNTER — Emergency Department (HOSPITAL_COMMUNITY)
Admission: EM | Admit: 2020-02-16 | Discharge: 2020-02-16 | Disposition: A | Discharge status: Home / Self Care | Payer: Medicare PPO | Attending: Emergency Medicine | Admitting: Emergency Medicine

## 2020-02-16 ENCOUNTER — Encounter (HOSPITAL_COMMUNITY): Payer: Self-pay | Admitting: Emergency Medicine

## 2020-02-16 DIAGNOSIS — E860 Dehydration: Secondary | ICD-10-CM | POA: Diagnosis not present

## 2020-02-16 DIAGNOSIS — E039 Hypothyroidism, unspecified: Secondary | ICD-10-CM | POA: Diagnosis not present

## 2020-02-16 DIAGNOSIS — R410 Disorientation, unspecified: Secondary | ICD-10-CM | POA: Diagnosis not present

## 2020-02-16 DIAGNOSIS — Z8673 Personal history of transient ischemic attack (TIA), and cerebral infarction without residual deficits: Secondary | ICD-10-CM | POA: Insufficient documentation

## 2020-02-16 DIAGNOSIS — U071 COVID-19: Secondary | ICD-10-CM | POA: Diagnosis not present

## 2020-02-16 DIAGNOSIS — I959 Hypotension, unspecified: Secondary | ICD-10-CM | POA: Diagnosis not present

## 2020-02-16 DIAGNOSIS — I10 Essential (primary) hypertension: Secondary | ICD-10-CM | POA: Insufficient documentation

## 2020-02-16 DIAGNOSIS — I6782 Cerebral ischemia: Secondary | ICD-10-CM | POA: Diagnosis not present

## 2020-02-16 DIAGNOSIS — Z7982 Long term (current) use of aspirin: Secondary | ICD-10-CM | POA: Insufficient documentation

## 2020-02-16 DIAGNOSIS — R29810 Facial weakness: Secondary | ICD-10-CM | POA: Diagnosis not present

## 2020-02-16 DIAGNOSIS — Z79899 Other long term (current) drug therapy: Secondary | ICD-10-CM | POA: Insufficient documentation

## 2020-02-16 DIAGNOSIS — G319 Degenerative disease of nervous system, unspecified: Secondary | ICD-10-CM | POA: Diagnosis not present

## 2020-02-16 DIAGNOSIS — Z79890 Hormone replacement therapy (postmenopausal): Secondary | ICD-10-CM | POA: Insufficient documentation

## 2020-02-16 DIAGNOSIS — J3489 Other specified disorders of nose and nasal sinuses: Secondary | ICD-10-CM | POA: Diagnosis not present

## 2020-02-16 DIAGNOSIS — R531 Weakness: Secondary | ICD-10-CM | POA: Diagnosis not present

## 2020-02-16 DIAGNOSIS — I672 Cerebral atherosclerosis: Secondary | ICD-10-CM | POA: Diagnosis not present

## 2020-02-16 DIAGNOSIS — R4781 Slurred speech: Secondary | ICD-10-CM | POA: Diagnosis not present

## 2020-02-16 DIAGNOSIS — R4182 Altered mental status, unspecified: Secondary | ICD-10-CM | POA: Diagnosis present

## 2020-02-16 DIAGNOSIS — R0600 Dyspnea, unspecified: Secondary | ICD-10-CM | POA: Diagnosis not present

## 2020-02-16 LAB — CBC WITH DIFFERENTIAL/PLATELET
Abs Immature Granulocytes: 0.02 10*3/uL (ref 0.00–0.07)
Basophils Absolute: 0 10*3/uL (ref 0.0–0.1)
Basophils Relative: 0 %
Eosinophils Absolute: 0 10*3/uL (ref 0.0–0.5)
Eosinophils Relative: 1 %
HCT: 36.7 % (ref 36.0–46.0)
Hemoglobin: 11.2 g/dL — ABNORMAL LOW (ref 12.0–15.0)
Immature Granulocytes: 1 %
Lymphocytes Relative: 33 %
Lymphs Abs: 0.8 10*3/uL (ref 0.7–4.0)
MCH: 28.7 pg (ref 26.0–34.0)
MCHC: 30.5 g/dL (ref 30.0–36.0)
MCV: 94.1 fL (ref 80.0–100.0)
Monocytes Absolute: 0.3 10*3/uL (ref 0.1–1.0)
Monocytes Relative: 12 %
Neutro Abs: 1.3 10*3/uL — ABNORMAL LOW (ref 1.7–7.7)
Neutrophils Relative %: 53 %
Platelets: 128 10*3/uL — ABNORMAL LOW (ref 150–400)
RBC: 3.9 MIL/uL (ref 3.87–5.11)
RDW: 13.4 % (ref 11.5–15.5)
WBC: 2.4 10*3/uL — ABNORMAL LOW (ref 4.0–10.5)
nRBC: 0 % (ref 0.0–0.2)

## 2020-02-16 LAB — URINALYSIS, ROUTINE W REFLEX MICROSCOPIC
Bilirubin Urine: NEGATIVE
Glucose, UA: NEGATIVE mg/dL
Hgb urine dipstick: NEGATIVE
Ketones, ur: NEGATIVE mg/dL
Leukocytes,Ua: NEGATIVE
Nitrite: NEGATIVE
Protein, ur: NEGATIVE mg/dL
Specific Gravity, Urine: 1.014 (ref 1.005–1.030)
pH: 5 (ref 5.0–8.0)

## 2020-02-16 LAB — COMPREHENSIVE METABOLIC PANEL
ALT: 22 U/L (ref 0–44)
AST: 26 U/L (ref 15–41)
Albumin: 3.1 g/dL — ABNORMAL LOW (ref 3.5–5.0)
Alkaline Phosphatase: 36 U/L — ABNORMAL LOW (ref 38–126)
Anion gap: 9 (ref 5–15)
BUN: 28 mg/dL — ABNORMAL HIGH (ref 8–23)
CO2: 26 mmol/L (ref 22–32)
Calcium: 8.5 mg/dL — ABNORMAL LOW (ref 8.9–10.3)
Chloride: 103 mmol/L (ref 98–111)
Creatinine, Ser: 1.18 mg/dL — ABNORMAL HIGH (ref 0.44–1.00)
GFR calc Af Amer: 50 mL/min — ABNORMAL LOW (ref >60)
GFR calc non Af Amer: 43 mL/min — ABNORMAL LOW (ref >60)
Glucose, Bld: 114 mg/dL — ABNORMAL HIGH (ref 70–99)
Potassium: 3.8 mmol/L (ref 3.5–5.1)
Sodium: 138 mmol/L (ref 135–145)
Total Bilirubin: 0.6 mg/dL (ref 0.3–1.2)
Total Protein: 5.8 g/dL — ABNORMAL LOW (ref 6.5–8.1)

## 2020-02-16 LAB — CBG MONITORING, ED: Glucose-Capillary: 125 mg/dL — ABNORMAL HIGH (ref 70–99)

## 2020-02-16 LAB — PROTIME-INR
INR: 0.9 (ref 0.8–1.2)
Prothrombin Time: 12.1 seconds (ref 11.4–15.2)

## 2020-02-16 LAB — I-STAT CHEM 8, ED
BUN: 27 mg/dL — ABNORMAL HIGH (ref 8–23)
Calcium, Ion: 1.06 mmol/L — ABNORMAL LOW (ref 1.15–1.40)
Chloride: 102 mmol/L (ref 98–111)
Creatinine, Ser: 1.1 mg/dL — ABNORMAL HIGH (ref 0.44–1.00)
Glucose, Bld: 104 mg/dL — ABNORMAL HIGH (ref 70–99)
HCT: 33 % — ABNORMAL LOW (ref 36.0–46.0)
Hemoglobin: 11.2 g/dL — ABNORMAL LOW (ref 12.0–15.0)
Potassium: 3.8 mmol/L (ref 3.5–5.1)
Sodium: 139 mmol/L (ref 135–145)
TCO2: 26 mmol/L (ref 22–32)

## 2020-02-16 LAB — SARS CORONAVIRUS 2 BY RT PCR (HOSPITAL ORDER, PERFORMED IN ~~LOC~~ HOSPITAL LAB): SARS Coronavirus 2: POSITIVE — AB

## 2020-02-16 LAB — TROPONIN I (HIGH SENSITIVITY)
Troponin I (High Sensitivity): 9 ng/L (ref <18)
Troponin I (High Sensitivity): 9 ng/L (ref <18)

## 2020-02-16 LAB — LACTIC ACID, PLASMA
Lactic Acid, Venous: 0.8 mmol/L (ref 0.5–1.9)
Lactic Acid, Venous: 0.6 mmol/L (ref 0.5–1.9)

## 2020-02-16 MED ORDER — SODIUM CHLORIDE 0.9 % IV BOLUS
500.0000 mL | Freq: Once | INTRAVENOUS | Status: AC
  Administered 2020-02-16: 500 mL via INTRAVENOUS

## 2020-02-16 MED ORDER — SODIUM CHLORIDE 0.9 % IV SOLN: Freq: Once | INTRAVENOUS | Status: DC

## 2020-02-16 NOTE — ED Triage Notes (Signed)
BIB EMS from home, LKW 2000 yesterday. Per family patient was more lethargic than usual and woke up with slurred speech. Per family history of dementia but able to answer questions appropriately. VSS.

## 2020-02-16 NOTE — ED Notes (Signed)
Discharge instructions reviewed with pt. Pt verbalized understanding.   

## 2020-02-16 NOTE — Discharge Instructions (Addendum)
Please return for any problem.  Follow-up with your regular care provider as instructed. °

## 2020-02-16 NOTE — ED Notes (Signed)
Pt transported to MRI 

## 2020-02-16 NOTE — ED Notes (Signed)
RN called MRI to notify that pt is COVID +

## 2020-02-16 NOTE — ED Provider Notes (Signed)
[facility]   CSN: [id number] Arrival date & time: 02/16/20  1622     History Chief Complaint  Patient presents with  . Aphasia    Carly BeltonMartha C [name] is a 84 y.o. female.  84 year old female with prior medical history as detailed below presents for evaluation of AMS.  Patient last known normal yesterday around 8 PM.  Patient was noted to be somewhat lethargic and mildly confused this morning.  Family is concerned that her speech may have been somewhat slurred as well.  Upon arrival to the ED she appears to be alert and oriented.  She denies any specific complaint.  Her speech is clear and easily understood.     The history is provided by the patient, the EMS personnel and medical records.  Illness Location:  Altered, confused, slow to respond Severity:  Mild Onset quality:  Gradual Duration:  1 day Timing:  Constant Progression:  Waxing and waning Chronicity:  New      Past Medical History:  Diagnosis Date  . Anxiety   . Arthritis   . Depression   . Hypertension   . Pre-diabetes   . Stroke [facility](HCC)    6 years ago  . Thyroid disease     Patient Active Problem List   Diagnosis Date Noted  . Osteoarthritis of left hip 02/06/2020  . Anxiety 02/04/2020  . CVA (cerebral vascular accident) (HCC) 02/04/2020  . Depression 02/04/2020  . Essential hypertension 02/04/2020  . HSV-2 (herpes simplex virus 2) infection 02/04/2020  . Insomnia 02/04/2020  . Vitamin D deficiency 02/04/2020  . Constipation 01/09/2020  . Hearing loss 01/09/2020  . Hx of completed stroke 01/09/2020  . Hx of herpes simplex infection 01/09/2020  . Mixed dyslipidemia 01/09/2020  . Normocytic anemia 01/09/2020  . Prediabetes 01/09/2020  . Primary osteoarthritis involving multiple joints 01/09/2020  . Venous insufficiency 01/09/2020  . Vitamin B12 deficiency 01/09/2020  . Dementia with behavioral disturbance (HCC) 01/08/2020  . Delusional thoughts  (HCC) 01/06/2020  . Aggressive behavior   . Hallucinations   . Acquired hypothyroidism 03/18/2014  . Mixed hyperlipidemia 03/18/2014    Past Surgical History:  Procedure Laterality Date  . ABDOMINAL HYSTERECTOMY    . CHOLECYSTECTOMY    . MULTIPLE TOOTH EXTRACTIONS    . ROTATOR CUFF REPAIR Left      OB History   No obstetric history on file.     No family history on file.  Social History   Tobacco Use  . Smoking status: Never Smoker  . Smokeless tobacco: Never Used  Vaping Use  . Vaping Use: Never used  Substance Use Topics  . Alcohol use: Never  . Drug use: Never    Home Medications Prior to Admission medications   Medication Sig Start Date End Date Taking? Authorizing Provider  acetaminophen (TYLENOL) 500 MG tablet Take 500 mg by mouth every 6 (six) hours as needed for mild pain, moderate pain or headache.     [provider]  aspirin EC 81 MG tablet Take 81 mg by mouth daily. Swallow whole.    [provider]  gabapentin (NEURONTIN) 100 MG capsule Take 2 caps BID 02/11/20   Van ClinesAquino, Karen M, MD  levothyroxine (SYNTHROID) 50 MCG tablet Take 50 mcg by mouth daily before breakfast.    [provider]  lisinopril (ZESTRIL) 5 MG tablet Take 5 mg by mouth daily.    [provider]  memantine (NAMENDA) 5 MG tablet Take 1 tablet (  5 mg total) by mouth daily. 02/04/20   Van Clines, MD  metoprolol succinate (TOPROL-XL) 25 MG 24 hr tablet Take 25 mg by mouth daily. 01/23/20   [provider]  OLANZapine (ZYPREXA) 10 MG tablet Take 1/2 tab in AM, 1 tab qhs 02/11/20   Van Clines, MD  sertraline (ZOLOFT) 50 MG tablet Take 1 tablet (50 mg total) by mouth daily. 02/04/20   Van Clines, MD  simvastatin (ZOCOR) 20 MG tablet Take 20 mg by mouth at bedtime.     [provider]  traZODone (DESYREL) 50 MG tablet Take 2 tabs qhs 02/11/20   Van Clines, MD    Allergies    Donepezil  Review of Systems   Review of Systems   All other systems reviewed and are negative.   Physical Exam Updated Vital Signs BP (!) 112/59 (BP Location: Right Arm)   Pulse (!) 59   Temp 97.7 F (36.5 C) (Oral)   Resp 16   Ht 5\' 6"  (1.676 m)   Wt 65.8 kg   SpO2 95%   BMI 23.41 kg/m   Physical Exam Vitals and nursing note reviewed.  Constitutional:      General: She is not in acute distress.    Appearance: Normal appearance. She is well-developed.  HENT:     Head: Normocephalic and atraumatic.     Mouth/Throat:     Mouth: Mucous membranes are dry.  Eyes:     Conjunctiva/sclera: Conjunctivae normal.     Pupils: Pupils are equal, round, and reactive to light.  Cardiovascular:     Rate and Rhythm: Normal rate and regular rhythm.     Heart sounds: Normal heart sounds.  Pulmonary:     Effort: Pulmonary effort is normal. No respiratory distress.     Breath sounds: Normal breath sounds.  Abdominal:     General: There is no distension.     Palpations: Abdomen is soft.     Tenderness: There is no abdominal tenderness.  Musculoskeletal:        General: No deformity. Normal range of motion.     Cervical back: Normal range of motion and neck supple.  Skin:    General: Skin is warm and dry.  Neurological:     General: No focal deficit present.     Mental Status: She is alert and oriented to person, place, and time. Mental status is at baseline.     Cranial Nerves: No cranial nerve deficit.     Sensory: No sensory deficit.     Motor: No weakness.     Coordination: Coordination normal.     ED Results / Procedures / Treatments   Labs (all labs ordered are listed, but only abnormal results are displayed) Labs Reviewed  I-STAT CHEM 8, ED - Abnormal; Notable for the following components:      Result Value   BUN 27 (*)    Creatinine, Ser 1.10 (*)    Glucose, Bld 104 (*)    Calcium, Ion 1.06 (*)    Hemoglobin 11.2 (*)    HCT 33.0 (*)    All other components within normal limits  SARS CORONAVIRUS 2 BY RT PCR  (HOSPITAL ORDER, PERFORMED IN [facility])  PROTIME-INR  URINALYSIS, ROUTINE W REFLEX MICROSCOPIC  COMPREHENSIVE METABOLIC PANEL  CBC WITH DIFFERENTIAL/PLATELET  LACTIC ACID, PLASMA  LACTIC ACID, PLASMA  TROPONIN I (HIGH SENSITIVITY)    EKG EKG Interpretation  Date/Time:  Saturday February 16 2020 16:37:39  EDT Ventricular Rate:  58 PR Interval:    QRS Duration: 97 QT Interval:  440 QTC Calculation: 433 R Axis:   60 Text Interpretation: Sinus rhythm Consider left atrial enlargement Confirmed by Kristine Royal [phone]) on 02/16/2020 4:47:47 PM   Radiology CT Head Wo Contrast  Result Date: 02/16/2020 CLINICAL DATA:  Delirium, increased lethargy, awoke with slurred speech EXAM: CT HEAD WITHOUT CONTRAST TECHNIQUE: Contiguous axial images were obtained from the base of the skull through the vertex without intravenous contrast. Sagittal and coronal MPR images reconstructed from axial data set. COMPARISON:  12/26/2019 FINDINGS: Brain: Generalized atrophy. Normal ventricular morphology. No midline shift or mass effect. Mild small vessel chronic ischemic changes of deep cerebral white matter. No intracranial hemorrhage, mass lesion, or evidence of acute infarction. No extra-axial fluid collections. Vascular: Atherosclerotic calcification of internal carotid and vertebral arteries at skull base. Skull: Intact Sinuses/Orbits: Clear Other: N/A IMPRESSION: Atrophy with mild small vessel chronic ischemic changes of deep cerebral white matter. No acute intracranial abnormalities. Electronically Signed   By: Ulyses Southward M.D.   On: 02/16/2020 17:13   DG Chest Port 1 View  Result Date: 02/16/2020 CLINICAL DATA:  Dyspnea EXAM: PORTABLE CHEST 1 VIEW COMPARISON:  Chest radiograph dated 01/08/2020 FINDINGS: The heart size and mediastinal contours are within normal limits. Both lungs are clear. The visualized skeletal structures are unremarkable. IMPRESSION: No active disease. Electronically Signed    By: Romona Curls M.D.   On: 02/16/2020 16:57    Procedures Procedures (including critical care time)  Medications Ordered in ED Medications  sodium chloride 0.9 % bolus 500 mL (has no administration in time range)    ED Course  I have reviewed the triage vital signs and the nursing notes.  Pertinent labs & imaging results that were available during my care of the patient were reviewed by me and considered in my medical decision making (see chart for details).    MDM Rules/Calculators/A&P                          MDM  Screen complete  ANESA FRONEK was evaluated in Emergency Department on 02/16/2020 for the symptoms described in the history of present illness. She was evaluated in the context of the global COVID-19 pandemic, which necessitated consideration that the patient might be at risk for infection with the SARS-CoV-2 virus that causes COVID-19. Institutional protocols and algorithms that pertain to the evaluation of patients at risk for COVID-19 are in a state of rapid change based on information released by regulatory bodies including the CDC and federal and state organizations. These policies and algorithms were followed during the patient's care in the ED.  Patient presented for evaluation of reported slurring of speech.  Patient was also noted to be lethargic by family.  Patient appears to be moderately dehydrated on work-up.  Patient is noted to be Covid positive without evidence of respiratory compromise.  Patient's results were discussed with the patient's granddaughter at bedside.  Patient's granddaughter is a primary caregiver at home.  Granddaughter is requesting MRI to exclude possibility of stroke.  Patient with recent work-up of dementia with recently performed MRI of brain in July of this year.  Patient likely to be discharged home if MRI does not demonstrate evidence of acute stroke.  Granddaughter prefers that patient be discharged home with all possible.   Patient does appear to be improved with gentle rehydration.  I suspect that the patient's Covid infection  is responsible for her mild dehydration.  Final Clinical Impression(s) / ED Diagnoses Final diagnoses:  COVID-19  Dehydration    Rx / DC Orders ED Discharge Orders    None       Wynetta Fines, MD 02/16/20 2246

## 2020-02-17 ENCOUNTER — Telehealth (HOSPITAL_COMMUNITY): Payer: Self-pay | Admitting: Nurse Practitioner

## 2020-02-17 ENCOUNTER — Encounter: Payer: Self-pay | Admitting: Nurse Practitioner

## 2020-02-17 ENCOUNTER — Telehealth (HOSPITAL_COMMUNITY): Payer: Self-pay | Admitting: Family

## 2020-02-17 DIAGNOSIS — U071 COVID-19: Secondary | ICD-10-CM

## 2020-02-17 NOTE — Telephone Encounter (Signed)
Called to Discuss with patient about Covid symptoms and the use of regeneron, a monoclonal antibody infusion for those with mild to moderate Covid symptoms and at a high risk of hospitalization.     Pt is qualified for this infusion at the [facility] due to co-morbid conditions and/or a member of an at-risk group.     Unable to reach pt. Left message to return call. Sent mychart message.   Hung Rhinesmith, DNP, AGNP-C [phone] (Infusion Center Hotline)  

## 2020-02-17 NOTE — Telephone Encounter (Addendum)
Called to discuss with Rhea Belton about Covid symptoms and the use of casirivimab/imdevimab, a combination monoclonal antibody infusion for those with mild to moderate Covid symptoms and at a high risk of hospitalization.     Message left by [facility] infusion staff member earlier for patient. Patient's grand-daughter [name] returned call to hotline, but at time of my return call to her, [name] is not with the patient. She advises she will call back when she is with Ms. Richardson Dopp.    Patient Active Problem List   Diagnosis Date Noted  . Osteoarthritis of left hip 02/06/2020  . Anxiety 02/04/2020  . CVA (cerebral vascular accident) (HCC) 02/04/2020  . Depression 02/04/2020  . Essential hypertension 02/04/2020  . HSV-2 (herpes simplex virus 2) infection 02/04/2020  . Insomnia 02/04/2020  . Vitamin D deficiency 02/04/2020  . Constipation 01/09/2020  . Hearing loss 01/09/2020  . Hx of completed stroke 01/09/2020  . Hx of herpes simplex infection 01/09/2020  . Mixed dyslipidemia 01/09/2020  . Normocytic anemia 01/09/2020  . Prediabetes 01/09/2020  . Primary osteoarthritis involving multiple joints 01/09/2020  . Venous insufficiency 01/09/2020  . Vitamin B12 deficiency 01/09/2020  . Dementia with behavioral disturbance (HCC) 01/08/2020  . Delusional thoughts (HCC) 01/06/2020  . Aggressive behavior   . Hallucinations   . Acquired hypothyroidism 03/18/2014  . Mixed hyperlipidemia 03/18/2014    Fahad Cisse,NP

## 2020-02-18 ENCOUNTER — Other Ambulatory Visit (HOSPITAL_COMMUNITY): Payer: Self-pay | Admitting: Nurse Practitioner

## 2020-02-18 ENCOUNTER — Other Ambulatory Visit: Payer: Self-pay | Admitting: Infectious Diseases

## 2020-02-18 ENCOUNTER — Encounter: Payer: Self-pay | Admitting: Nurse Practitioner

## 2020-02-18 ENCOUNTER — Ambulatory Visit (HOSPITAL_COMMUNITY)
Admission: RE | Admit: 2020-02-18 | Discharge: 2020-02-18 | Disposition: A | Discharge status: Home / Self Care | Payer: Medicare Other | Source: Ambulatory Visit | Attending: Pulmonary Disease | Admitting: Pulmonary Disease

## 2020-02-18 DIAGNOSIS — R54 Age-related physical debility: Secondary | ICD-10-CM | POA: Insufficient documentation

## 2020-02-18 DIAGNOSIS — Z23 Encounter for immunization: Secondary | ICD-10-CM | POA: Diagnosis not present

## 2020-02-18 DIAGNOSIS — U071 COVID-19: Secondary | ICD-10-CM

## 2020-02-18 DIAGNOSIS — I639 Cerebral infarction, unspecified: Secondary | ICD-10-CM

## 2020-02-18 MED ORDER — EPINEPHRINE 0.3 MG/0.3ML IJ SOAJ: 0.3000 mg | Freq: Once | INTRAMUSCULAR | Status: DC | PRN

## 2020-02-18 MED ORDER — SODIUM CHLORIDE 0.9 % IV SOLN
1200.0000 mg | Freq: Once | INTRAVENOUS | Status: AC
  Administered 2020-02-18: 1200 mg via INTRAVENOUS

## 2020-02-18 MED ORDER — ALBUTEROL SULFATE HFA 108 (90 BASE) MCG/ACT IN AERS: 2.0000 | INHALATION_SPRAY | Freq: Once | RESPIRATORY_TRACT | Status: DC | PRN

## 2020-02-18 MED ORDER — SODIUM CHLORIDE 0.9 % IV SOLN: INTRAVENOUS | Status: DC | PRN

## 2020-02-18 MED ORDER — METHYLPREDNISOLONE SODIUM SUCC 125 MG IJ SOLR: 125.0000 mg | Freq: Once | INTRAMUSCULAR | Status: DC | PRN

## 2020-02-18 MED ORDER — DIPHENHYDRAMINE HCL 50 MG/ML IJ SOLN: 50.0000 mg | Freq: Once | INTRAMUSCULAR | Status: DC | PRN

## 2020-02-18 MED ORDER — FAMOTIDINE IN NACL 20-0.9 MG/50ML-% IV SOLN: 20.0000 mg | Freq: Once | INTRAVENOUS | Status: DC | PRN

## 2020-02-18 NOTE — Progress Notes (Signed)
  Diagnosis: COVID-19  Physician:Dr Wright   Procedure: [facility] Infusion Clinic Med: casirivimab\imdevimab infusion - Provided patient with casirivimab\imdevimab fact sheet for patients, parents and caregivers prior to infusion.  Complications: No immediate complications noted.  Discharge: Discharged home   Loriene Taunton W 02/18/2020  

## 2020-02-18 NOTE — Discharge Instructions (Signed)

## 2020-02-18 NOTE — Progress Notes (Signed)
See previous note under 'orders only' encounter by Burna Mortimer

## 2020-02-18 NOTE — Progress Notes (Signed)
I connected by phone with [name]'s granddaughter [name] who identifies herself as Chief Financial Officer healthcare power of attorney on 02/18/2020 at 10:28 AM to discuss the potential use of an new treatment for mild to moderate COVID-19 viral infection in non-hospitalized patients.  This patient is a 84 y.o. female that meets the FDA criteria for Emergency Use Authorization of casirivimab\imdevimab.  Has a (+) direct SARS-CoV-2 viral test result  Has mild or moderate COVID-19   Is ? 84 years of age and weighs ? 40 kg  Is NOT hospitalized due to COVID-19  Is NOT requiring oxygen therapy or requiring an increase in baseline oxygen flow rate due to COVID-19  Is within 10 days of symptom onset  Has at least one of the high risk factor(s) for progression to severe COVID-19 and/or hospitalization as defined in EUA.  Specific high risk criteria : Older age (>/= 82 yo)   I have spoken and communicated the following to the patient or parent/caregiver:  1. FDA has authorized the emergency use of casirivimab\imdevimab for the treatment of mild to moderate COVID-19 in adults and pediatric patients with positive results of direct SARS-CoV-2 viral testing who are 90 years of age and older weighing at least 40 kg, and who are at high risk for progressing to severe COVID-19 and/or hospitalization.  2. The significant known and potential risks and benefits of casirivimab\imdevimab, and the extent to which such potential risks and benefits are unknown.  3. Information on available alternative treatments and the risks and benefits of those alternatives, including clinical trials.  4. Patients treated with casirivimab\imdevimab should continue to self-isolate and use infection control measures (e.g., wear mask, isolate, social distance, avoid sharing personal items, clean and disinfect "high touch" surfaces, and frequent handwashing) according to CDC guidelines.   5. The patient or parent/caregiver has  the option to accept or refuse casirivimab\imdevimab .  After reviewing this information with the patient, The patient agreed to proceed with receiving casirivimab\imdevimab infusion and will be provided a copy of the Fact sheet prior to receiving the infusion.[name], DNP, AGNP-C [phone] (Infusion Center Hotline)

## 2020-02-18 NOTE — Progress Notes (Signed)
  Diagnosis: COVID-19  Physician: Dr. Wright  Procedure: [facility] Infusion Clinic Med: casirivimab\imdevimab infusion - Provided patient with casirivimab\imdevimab fact sheet for patients, parents and caregivers prior to infusion.  Complications: No immediate complications noted.  Discharge: Discharged home   Frazer [place] E Liann Spaeth 02/18/2020   

## 2020-02-20 ENCOUNTER — Other Ambulatory Visit: Payer: Self-pay | Admitting: Neurology

## 2020-02-20 MED ORDER — TRAZODONE HCL 50 MG PO TABS
ORAL_TABLET | ORAL | 3 refills | Status: DC
Start: 2020-02-20 — End: 2020-12-08

## 2020-04-16 ENCOUNTER — Ambulatory Visit: Payer: Medicare PPO | Admitting: Neurology

## 2020-06-09 ENCOUNTER — Other Ambulatory Visit: Payer: Self-pay

## 2020-06-09 ENCOUNTER — Telehealth (INDEPENDENT_AMBULATORY_CARE_PROVIDER_SITE_OTHER): Payer: Medicare PPO | Admitting: Neurology

## 2020-06-09 ENCOUNTER — Encounter: Payer: Self-pay | Admitting: Neurology

## 2020-06-09 VITALS — Ht 66.0 in | Wt 145.0 lb

## 2020-06-09 DIAGNOSIS — F0281 Dementia in other diseases classified elsewhere with behavioral disturbance: Secondary | ICD-10-CM

## 2020-06-09 DIAGNOSIS — G301 Alzheimer's disease with late onset: Secondary | ICD-10-CM

## 2020-06-09 DIAGNOSIS — F02818 Dementia in other diseases classified elsewhere, unspecified severity, with other behavioral disturbance: Secondary | ICD-10-CM

## 2020-06-09 NOTE — Progress Notes (Signed)
Virtual Visit via Video Note The purpose of this virtual visit is to provide medical care while limiting exposure to the novel coronavirus.    Consent was obtained for video visit:  Yes.   Answered questions that patient had about telehealth interaction:  Yes.   I discussed the limitations, risks, security and privacy concerns of performing an evaluation and management service by telemedicine. I also discussed with the patient that there may be a patient responsible charge related to this service. The patient expressed understanding and agreed to proceed.  Pt location: Home Physician Location: office Name of referring provider:  [name], MD I connected with [name] at patients grandaughter's initiation/request on 06/09/2020 at  2:00 PM EST by video enabled telemedicine application and verified that I am speaking with the correct person using two identifiers. Pt MRN:  [id number] Pt DOB:  082/03/1936 Video Participants:  [name];  [name] (granddaughter)   History of Present Illness:  The patient had a virtual video visit on 06/09/2020. She was last seen 4 months ago for dementia with behavioral disturbance. Her granddaughter [name] is present to provide information. [name] had called our office soon after her visit in August to report that something was changing, she was asking a lot of questions and did not want [name] to give her the medications. She had tested positive for COVID and would not stay in her room or wear a mask. She was brought to the ER and was noted to be moderately dehydrated. She had an MRI brain which I personally reviewed, no acute changes, there was diffuse atrophy,mild to moderate chronic microvascular disease. [name] was advised to increase gabapentin and olanzapine, however she was drowsy on gabapentin 200mg  BID and currently takes 100mg  in AM, 200mg  in PM. She takes olanzapine 10mg  1 tab qhs and Trazodone 100mg  qhs. She is also on Sertraline 50mg  daily and  Memantine 5mg  daily. [name] reports that her medications are working great right now, no delusions or significant behavioral issues. She has good and bad days where she gets very confused. She sometimes thinks she had lunch with someone and gets agitated when told otherwise. Sleep is good now, she sleeps all night and has a good routine. She is independent with dressing and bathing. She ambulates with a walker. No falls. She denies any headaches, dizziness, focal numbness/tingling/weakness. No bowel/bladder dysfunction.    History on Initial Assessment 01/01/2020: This is a pleasant 84 year old right-handed woman with a history of hypertension, hyperlipidemia, prior stroke with no residual deficits, presenting for evaluation of auditory hallucinations and memory loss. She states her memory "comes and goes." She lives with her granddaughter and daughter-in-law . started noticing memory changes the last couple of years where she would be repeating herself. Recently, memory has worsened, she had 2 appointments in one day and did not remember the afternoon appointment. They have had to write down notes more. She manages her own medications and has not noticed any issues with forgetting medications. She used to live with her husband then he passed away and she lived alone in [place] for 9 months, before she moved in with [place] 2 years ago. She was not missing any bill payments while alone, bills were switched to autopay 2 years ago. She continues to drive around [place] and denies getting lost. She was getting lost in [place], but they feel it was due to being in an unfamiliar place. [name] started noticing auditory hallucinations while she  was still living alone, she would say something was not right with the air, or the doors, so [name] moved her. She was saying the she would hear cars at night or someone trying to break in, barricading her door. She would say some boys were drinking  beside her condo or she had mice in the condo and put traps out but never caught any. Since moving in with [name] 2 years ago, she continued to have hallucinations, mostly at night initially, saying something was in the bed with her. Hallucinations have progressively worsened the past few months, also occurring in the daytime. She does not remember what she hears at night, [name] would remind her that she tells [name] she hears her granddaughter calling her "[name]" at night or during the day when the granddaughter is not home. She denies any visual hallucinations, however [name] reminds her the other day she thought she saw someone getting in the window, she heard things outside and said they were packing things into delivery trucks. She saw arms lifting things up, loading trucks up. She saw a shadow come back the other day on the 2nd level window. She would be afraid he would get a hold of the family.  She would be up all night, knocking on their doors telling them about the hallucinations. Melatonin did not help. Family brought her to the ER a week ago, CBC, CMP, urinalysis unremarkable except for anemia. I personally reviewed head CT without contrast which did not show any acute changes, there was diffuse cerebral and cerebellar atrophy, moderately extensive chronic microvascular disease. They were instructed to give Tylenol PM to help her sleep at night, she has been taking 2 tabs qhs and has been sleeping all night with this since then. No REM behavior disorder noted. [name] denies any prior psychiatric history. No hygiene concerns, she is able to bathe and dress independently. Her mother and father had Alzheimer's disease. No history of significant head injuries. She has not had any alcohol in a while.   She denies any headaches, dizziness, diplopia, dysarthria/dysphagia, neck/back pain, focal numbness/tingling/weakness, bowel/bladder dysfunction, anosmia, or tremors. Mood comes and goes, "but not bad." Her last  fall was in October 2020 when she tripped. She is scheduled for left hip replacement on 01/28/20.   Update 02/04/2020: Since her last visit, she was brought to the ER on 01/04/20 for worsening behaviors with auditory and visual hallucinations about babies crying while accusing family of killing the crying babies. She would run out of the house at all times at night. The police was in their home when she drove off without her phone. An IVC had to be issued so police could get her. She was brought to the ER where bloodwork was unremarkable. UA showed moderate leukocytes, 6-10 WBC, culture negative. UDS negative. She had an MRI brain on 01/05/20 which did not show any acute changes. There was diffuse cerebral atrophy, moderate chronic microvascular disease. She was [facility] to [facility] from 7/20 to 8/4. She was started on gabapentin 150mg  every 8 hours (250mg /8mL taking 3 mLs TID), Memantine 5mg  daily, olanzapine 10mg  qhs, Sertraline 50mg  daily, and Trazodone 50mg  qhs. Donepezil was stopped since she had significant worsening the day she started it. She has had significant improvement since hospital discharge. No further hallucinations or delusions.    Current Outpatient Medications on File Prior to Visit  Medication Sig Dispense Refill  . acetaminophen (TYLENOL) 500 MG tablet Take 1,000 mg by mouth every 6 (six) hours  as needed for headache (pain).     Marland Kitchen aspirin EC 81 MG tablet Take 81 mg by mouth daily. Swallow whole.    . gabapentin (NEURONTIN) 100 MG capsule Take 2 caps BID (Patient taking differently: Take 100-200 mg by mouth See admin instructions. Take 1 capsule (100 mg) by mouth every morning and 2 capsules (200 mg) at night) 360 capsule 3  . levothyroxine (SYNTHROID) 50 MCG tablet Take 50 mcg by mouth daily before breakfast.    . lisinopril (ZESTRIL) 5 MG tablet Take 5 mg by mouth daily.    . memantine (NAMENDA) 5 MG tablet Take 1 tablet (5 mg total) by mouth daily. 90 tablet 3  .  metoprolol succinate (TOPROL-XL) 25 MG 24 hr tablet Take 25 mg by mouth daily.    Marland Kitchen OLANZapine (ZYPREXA) 10 MG tablet Take 1/2 tab in AM, 1 tab qhs (Patient taking differently: Take 5-10 mg by mouth See admin instructions. Take 1tablet (5 mg) by mouth every morning (as needed) and 1 tablet (10 mg) at bedtime) 135 tablet 3  . sertraline (ZOLOFT) 50 MG tablet Take 1 tablet (50 mg total) by mouth daily. 90 tablet 3  . simvastatin (ZOCOR) 20 MG tablet Take 20 mg by mouth at bedtime.     . traZODone (DESYREL) 50 MG tablet Take 2 tabs qhs 180 tablet 3   No current facility-administered medications on file prior to visit.     Observations/Objective:   Vitals:   06/09/20 1120  Weight: 145 lb (65.8 kg)  Height: 5\' 6"  (1.676 m)   GEN:  The patient appears stated age and is in NAD.  Neurological examination: Patient is awake, alert, oriented to person, place. Month is Jan, year "I forgot." Difficulty with naming, says "plane" for "pen," able to name cup. Reduced fluency, able to follow simple commands. Remote and recent memory impaired. Cranial nerves: Extraocular movements intact with no nystagmus. No facial asymmetry. Motor: moves all extremities symmetrically, at least anti-gravity x 4. No incoordination on finger to nose testing. Gait: slow and cautious with walker, favoring right leg   Assessment and Plan:   This is a pleasant 84 yo RH woman with a history of hypertension, hyperlipidemia, prior stroke with no residual deficits, with likely Alzheimer's disease with behavioral changes. MRI brain no acute changes, there was diffuse cerebral atrophy, moderate chronic microvascular disease. Psychosis has quieted down on current regimen, continue olanzapine 10mg  qhs, sertraline 50mg  daily, Trazodone 100mg  qhs, Memantine 5mg  daily, gabapentin 100mg  in AM, 200mg  qhs. Continue 24/7 care. Follow-up in 6 months, they know to call for any changes.     Follow Up Instructions:   -I discussed the assessment  and treatment plan with the patient/granddaughter. The patient/granddaughter were provided an opportunity to ask questions and all were answered. The patient/granddaughter agreed with the plan and demonstrated an understanding of the instructions.   The patient/granddaughter were advised to call back or seek an in-person evaluation if the symptoms worsen or if the condition fails to improve as anticipated.    97, MD

## 2020-06-17 MED ORDER — GABAPENTIN 100 MG PO CAPS: ORAL_CAPSULE | ORAL | 3 refills | Status: DC

## 2020-09-24 ENCOUNTER — Other Ambulatory Visit: Payer: Self-pay | Admitting: Orthopedic Surgery

## 2020-09-24 DIAGNOSIS — Z01818 Encounter for other preprocedural examination: Secondary | ICD-10-CM

## 2020-10-06 ENCOUNTER — Other Ambulatory Visit (HOSPITAL_COMMUNITY): Payer: Self-pay

## 2020-10-07 DIAGNOSIS — M1612 Unilateral primary osteoarthritis, left hip: Secondary | ICD-10-CM | POA: Diagnosis not present

## 2020-10-08 DIAGNOSIS — Z01818 Encounter for other preprocedural examination: Secondary | ICD-10-CM | POA: Diagnosis not present

## 2020-10-08 DIAGNOSIS — M1612 Unilateral primary osteoarthritis, left hip: Secondary | ICD-10-CM | POA: Diagnosis not present

## 2020-10-13 NOTE — Care Plan (Signed)
Ortho Bundle Case Management Note  Patient Details  Name: [name] MRN: [id number] Date of Birth: 1935/10/24  Spoke with patient and granddaughter in the office prior to surgery. She will discharge to home with family to assist. They have all needed equipment at home. Patient has new diagnosis of dementia. Granddaughter is her caretaker.  HHPT referral to [facility] Care and OPPT set up with [facility]. Patient and MD in agreement with plan. Choice offered.                     DME Arranged:    DME Agency:     HH Arranged:  PT HH Agency:  [facility]  Additional Comments: Please contact me with any questions of if this plan should need to change.  Shauna Hugh,  RN,BSN,MHA,CCM  [facility] Orthopaedic Specialist  [phone] 10/13/2020, 11:47 AM

## 2020-10-14 NOTE — Patient Instructions (Addendum)
DUE TO COVID-19 ONLY ONE VISITOR IS ALLOWED TO COME WITH YOU AND STAY IN THE WAITING ROOM ONLY DURING PRE OP AND PROCEDURE DAY OF SURGERY. THE 1 VISITOR  MAY VISIT WITH YOU AFTER SURGERY IN YOUR PRIVATE ROOM DURING VISITING HOURS ONLY!  YOU NEED TO HAVE A COVID 19 TEST ON_5/2______ @_10 :50______, THIS TEST MUST BE DONE BEFORE SURGERY,  COVID TESTING SITE [address] [place] , IT IS ON THE RIGHT GOING OUT [address] APPROXIMATELY  2 MINUTES PAST [employer] ON THE RIGHT. ONCE YOUR COVID TEST IS COMPLETED,  PLEASE BEGIN THE QUARANTINE INSTRUCTIONS AS OUTLINED IN YOUR HANDOUT.                [name]    Your procedure is scheduled on: 10/22/20   Report to [facility] Main  Entrance   Report to admitting at   10:25 AM     Call this number if you have problems the morning of surgery [phone]    BRUSH YOUR TEETH MORNING OF SURGERY AND RINSE YOUR MOUTH OUT, NO CHEWING GUM CANDY OR MINTS.  No food after midnight.    You may have clear liquid until 9:30 AM.     . Nothing by mouth after 9:30 AM.    Take these medicines the morning of surgery with A SIP OF WATER: Gabapentin, Zoloft, Zyprexa, Metoprolol, Memantine, Levothyroxine                                 You may not have any metal on your body including hair pins and              piercings  Do not wear jewelry, make-up, lotions, powders or perfumes, deodorant             Do not wear nail polish on your fingernails.  Do not shave  48 hours prior to surgery.  .   Do not bring valuables to the hospital. [facility] IS NOT             RESPONSIBLE   FOR VALUABLES.  Contacts, dentures or bridgework may not be worn into surgery.                  [facility] - Preparing for Surgery Before surgery, you can play an important role.  Because skin is not sterile, your skin needs to be as free of germs as possible.  You can reduce the number of germs on your skin by washing with CHG  (chlorahexidine gluconate) soap before surgery.  CHG is an antiseptic cleaner which kills germs and bonds with the skin to continue killing germs even after washing. Please DO NOT use if you have an allergy to CHG or antibacterial soaps.  If your skin becomes reddened/irritated stop using the CHG and inform your nurse when you arrive at Short Stay.   You may shave your face/neck.  Please follow these instructions carefully:  1.  Shower with CHG Soap the night before surgery and the  morning of Surgery.  2.  If you choose to wash your hair, wash your hair first as usual with your  normal  shampoo.  3.  After you shampoo, rinse your hair and body thoroughly to remove the  shampoo.  4.  Use CHG as you would any other liquid soap.  You can apply chg directly  to the skin and wash                       Gently with a scrungie or clean washcloth.  5.  Apply the CHG Soap to your body ONLY FROM THE NECK DOWN.   Do not use on face/ open                           Wound or open sores. Avoid contact with eyes, ears mouth and genitals (private parts).                       Wash face,  Genitals (private parts) with your normal soap.             6.  Wash thoroughly, paying special attention to the area where your surgery  will be performed.  7.  Thoroughly rinse your body with warm water from the neck down.  8.  DO NOT shower/wash with your normal soap after using and rinsing off  the CHG Soap.                9.  Pat yourself dry with a clean towel.            10.  Wear clean pajamas.            11.  Place clean sheets on your bed the night of your first shower and do not  sleep with pets. Day of Surgery : Do not apply any lotions/deodorants the morning of surgery.  Please wear clean clothes to the hospital/surgery center.  FAILURE TO FOLLOW THESE INSTRUCTIONS MAY RESULT IN THE CANCELLATION OF YOUR SURGERY PATIENT SIGNATURE_________________________________  NURSE  SIGNATURE__________________________________  ________________________________________________________________________   Adam Phenix  An incentive spirometer is a tool that can help keep your lungs clear and active. This tool measures how well you are filling your lungs with each breath. Taking long deep breaths may help reverse or decrease the chance of developing breathing (pulmonary) problems (especially infection) following:  A long period of time when you are unable to move or be active. BEFORE THE PROCEDURE   If the spirometer includes an indicator to show your best effort, your nurse or respiratory therapist will set it to a desired goal.  If possible, sit up straight or lean slightly forward. Try not to slouch.  Hold the incentive spirometer in an upright position. INSTRUCTIONS FOR USE  1. Sit on the edge of your bed if possible, or sit up as far as you can in bed or on a chair. 2. Hold the incentive spirometer in an upright position. 3. Breathe out normally. 4. Place the mouthpiece in your mouth and seal your lips tightly around it. 5. Breathe in slowly and as deeply as possible, raising the piston or the ball toward the top of the column. 6. Hold your breath for 3-5 seconds or for as long as possible. Allow the piston or ball to fall to the bottom of the column. 7. Remove the mouthpiece from your mouth and breathe out normally. 8. Rest for a few seconds and repeat Steps 1 through 7 at least 10 times every 1-2 hours when you are awake. Take your time and take a few normal breaths between deep breaths. 9. The spirometer may include an indicator to show  your best effort. Use the indicator as a goal to work toward during each repetition. 10. After each set of 10 deep breaths, practice coughing to be sure your lungs are clear. If you have an incision (the cut made at the time of surgery), support your incision when coughing by placing a pillow or rolled up towels firmly  against it. Once you are able to get out of bed, walk around indoors and cough well. You may stop using the incentive spirometer when instructed by your caregiver.  RISKS AND COMPLICATIONS  Take your time so you do not get dizzy or light-headed.  If you are in pain, you may need to take or ask for pain medication before doing incentive spirometry. It is harder to take a deep breath if you are having pain. AFTER USE  Rest and breathe slowly and easily.  It can be helpful to keep track of a log of your progress. Your caregiver can provide you with a simple table to help with this. If you are using the spirometer at home, follow these instructions: [place] IF:   You are having difficultly using the spirometer.  You have trouble using the spirometer as often as instructed.  Your pain medication is not giving enough relief while using the spirometer.  You develop fever of 100.5 F (38.1 C) or higher. SEEK IMMEDIATE MEDICAL CARE IF:   You cough up bloody sputum that had not been present before.  You develop fever of 102 F (38.9 C) or greater.  You develop worsening pain at or near the incision site. MAKE SURE YOU:   Understand these instructions.  Will watch your condition.  Will get help right away if you are not doing well or get worse. Document Released: 10/18/2006 Document Revised: 08/30/2011 Document Reviewed: 12/19/2006 [facility] Patient Information 2014 [place], [place].   ________________________________________________________________________

## 2020-10-15 NOTE — H&P (Signed)
TOTAL HIP ADMISSION H&P  Patient is admitted for left total hip arthroplasty.  Subjective:  Chief Complaint: left hip pain  HPI: [name], 85 y.o. female, has a history of pain and functional disability in the left hip(s) due to arthritis and patient has failed non-surgical conservative treatments for greater than 12 weeks to include NSAID's and/or analgesics, use of assistive devices, weight reduction as appropriate and activity modification.  Onset of symptoms was gradual starting 2 years ago with gradually worsening course since that time.The patient noted no past surgery on the left hip(s).  Patient currently rates pain in the left hip at 10 out of 10 with activity. Patient has night pain, worsening of pain with activity and weight bearing, trendelenberg gait, pain that interfers with activities of daily living and pain with passive range of motion. Patient has evidence of periarticular osteophytes and joint space narrowing by imaging studies. This condition presents safety issues increasing the risk of falls.   There is no current active infection.  Patient Active Problem List   Diagnosis Date Noted  . Osteoarthritis of left hip 02/06/2020  . Anxiety 02/04/2020  . CVA (cerebral vascular accident) (HCC) 02/04/2020  . Depression 02/04/2020  . Essential hypertension 02/04/2020  . HSV-2 (herpes simplex virus 2) infection 02/04/2020  . Insomnia 02/04/2020  . Vitamin D deficiency 02/04/2020  . Constipation 01/09/2020  . Hearing loss 01/09/2020  . Hx of completed stroke 01/09/2020  . Hx of herpes simplex infection 01/09/2020  . Mixed dyslipidemia 01/09/2020  . Normocytic anemia 01/09/2020  . Prediabetes 01/09/2020  . Primary osteoarthritis involving multiple joints 01/09/2020  . Venous insufficiency 01/09/2020  . Vitamin B12 deficiency 01/09/2020  . Dementia with behavioral disturbance (HCC) 01/08/2020  . Delusional thoughts (HCC) 01/06/2020  . Aggressive behavior   .  Hallucinations   . Acquired hypothyroidism 03/18/2014  . Mixed hyperlipidemia 03/18/2014   Past Medical History:  Diagnosis Date  . Anxiety   . Arthritis   . Depression   . Hypertension   . Pre-diabetes   . Stroke [facility])    6 years ago  . Thyroid disease     Past Surgical History:  Procedure Laterality Date  . ABDOMINAL HYSTERECTOMY    . CHOLECYSTECTOMY    . MULTIPLE TOOTH EXTRACTIONS    . ROTATOR CUFF REPAIR Left     No current facility-administered medications for this encounter.   Current Outpatient Medications  Medication Sig Dispense Refill Last Dose  . acetaminophen (TYLENOL) 500 MG tablet Take 1,000 mg by mouth every 6 (six) hours as needed for headache (pain).      Marland Kitchen aspirin EC 81 MG tablet Take 81 mg by mouth in the morning. Swallow whole.     . gabapentin (NEURONTIN) 100 MG capsule Take 1 cap in AM, 2 caps in PM (Patient taking differently: Take 100-200 mg by mouth See admin instructions. Take 1 capsule (100 mg) by mouth in the morning & take 2 capsules (200 mg) by mouth at night.) 270 capsule 3   . levothyroxine (SYNTHROID) 50 MCG tablet Take 50 mcg by mouth daily before breakfast.     . lisinopril (ZESTRIL) 5 MG tablet Take 5 mg by mouth in the morning.     . memantine (NAMENDA) 5 MG tablet Take 1 tablet (5 mg total) by mouth daily. (Patient taking differently: Take 5 mg by mouth in the morning.) 90 tablet 3   . metoprolol succinate (TOPROL-XL) 50 MG 24 hr tablet Take 25 mg by mouth  in the morning.     Marland Kitchen OLANZapine (ZYPREXA) 10 MG tablet Take 1/2 tab in AM, 1 tab qhs (Patient taking differently: Take 10 mg by mouth See admin instructions. Take 0.5 tablet (5 mg) by mouth in the morning if needed for confusion & take 1 tablet (10 mg) by mouth (scheduled) every night.) 135 tablet 3   . sertraline (ZOLOFT) 50 MG tablet Take 1 tablet (50 mg total) by mouth daily. (Patient taking differently: Take 50 mg by mouth in the morning.) 90 tablet 3   . simvastatin (ZOCOR) 20 MG tablet  Take 20 mg by mouth at bedtime.      . traZODone (DESYREL) 50 MG tablet Take 2 tabs qhs (Patient taking differently: Take 100 mg by mouth at bedtime.) 180 tablet 3    Allergies  Allergen Reactions  . Donepezil Other (See Comments)    POSSIBLE (??) Hallucinations and sleep disturbances    Social History   Tobacco Use  . Smoking status: Never Smoker  . Smokeless tobacco: Never Used  Substance Use Topics  . Alcohol use: Never    No family history on file.   Review of Systems  Constitutional: Negative.   HENT: Negative.   Eyes: Negative.   Respiratory: Negative.   Cardiovascular:       HTN  Gastrointestinal: Positive for constipation.  Endocrine: Negative.   Genitourinary: Negative.   Musculoskeletal: Positive for arthralgias and myalgias.  Allergic/Immunologic: Negative.   Neurological: Negative.   Hematological: Negative.   Psychiatric/Behavioral: Positive for sleep disturbance.    Objective:  Physical Exam Constitutional:      Appearance: Normal appearance. She is normal weight.  HENT:     Head: Normocephalic and atraumatic.     Nose: Nose normal.     Mouth/Throat:     Mouth: Mucous membranes are moist.     Pharynx: Oropharynx is clear.  Eyes:     Pupils: Pupils are equal, round, and reactive to light.  Cardiovascular:     Pulses: Normal pulses.  Pulmonary:     Effort: Pulmonary effort is normal.  Musculoskeletal:        General: Tenderness present.     Cervical back: Normal range of motion and neck supple.     Comments: patient has significant pain with any attempts of internal rotation of the left hip.  Patient's right hip has minimal pain with hip flexion extension internal/external rotation and log roll.  Calves are soft and nontender.  Neurological:     General: No focal deficit present.     Mental Status: She is alert and oriented to person, place, and time. Mental status is at baseline.  Psychiatric:        Mood and Affect: Mood normal.         Behavior: Behavior normal.        Thought Content: Thought content normal.        Judgment: Judgment normal.     Vital signs in last 24 hours:    Labs:   Estimated body mass index is 23.4 kg/m as calculated from the following:   Height as of 06/09/20: 5\' 6"  (1.676 m).   Weight as of 06/09/20: 65.8 kg.   Imaging Review Plain radiographs demonstrateAP and lateral views of the hips showed bone-on-bone end-stage arthritis in the left hip with osteophyte formation, decreased articular cartilage was also noted in the right hip consistent with progressing osteoarthritis.  No acute injuries or fractures noted.     Assessment/Plan:  End  stage arthritis, left hip(s)  The patient history, physical examination, clinical judgement of the provider and imaging studies are consistent with end stage degenerative joint disease of the left hip(s) and total hip arthroplasty is deemed medically necessary. The treatment options including medical management, injection therapy, arthroscopy and arthroplasty were discussed at length. The risks and benefits of total hip arthroplasty were presented and reviewed. The risks due to aseptic loosening, infection, stiffness, dislocation/subluxation,  thromboembolic complications and other imponderables were discussed.  The patient acknowledged the explanation, agreed to proceed with the plan and consent was signed. Patient is being admitted for inpatient treatment for surgery, pain control, PT, OT, prophylactic antibiotics, VTE prophylaxis, progressive ambulation and ADL's and discharge planning.The patient is planning to be discharged home with home health services    Patient's anticipated LOS is less than 2 midnights, meeting these requirements: - Younger than 33 - Lives within 1 hour of care - Has a competent adult at home to recover with post-op recover - NO history of  - Chronic pain requiring opiods  - Diabetes  - Coronary Artery Disease  - Heart  failure  - Heart attack  - Stroke  - DVT/VTE  - Cardiac arrhythmia  - Respiratory Failure/COPD  - Renal failure  - Anemia  - Advanced Liver disease

## 2020-10-16 ENCOUNTER — Encounter (HOSPITAL_COMMUNITY)
Admission: RE | Admit: 2020-10-16 | Discharge: 2020-10-16 | Disposition: A | Discharge status: Home / Self Care | Payer: Medicare PPO | Source: Ambulatory Visit | Attending: Orthopedic Surgery | Admitting: Orthopedic Surgery

## 2020-10-16 ENCOUNTER — Encounter (HOSPITAL_COMMUNITY): Payer: Self-pay

## 2020-10-16 ENCOUNTER — Ambulatory Visit (HOSPITAL_COMMUNITY)
Admission: RE | Admit: 2020-10-16 | Discharge: 2020-10-16 | Disposition: A | Discharge status: Home / Self Care | Payer: Medicare PPO | Source: Ambulatory Visit | Attending: Orthopedic Surgery | Admitting: Orthopedic Surgery

## 2020-10-16 ENCOUNTER — Other Ambulatory Visit: Payer: Self-pay

## 2020-10-16 DIAGNOSIS — Z01818 Encounter for other preprocedural examination: Secondary | ICD-10-CM | POA: Insufficient documentation

## 2020-10-16 LAB — URINALYSIS, ROUTINE W REFLEX MICROSCOPIC
Bacteria, UA: NONE SEEN
Bilirubin Urine: NEGATIVE
Glucose, UA: NEGATIVE mg/dL
Hgb urine dipstick: NEGATIVE
Ketones, ur: NEGATIVE mg/dL
Nitrite: NEGATIVE
Protein, ur: NEGATIVE mg/dL
Specific Gravity, Urine: 1.013 (ref 1.005–1.030)
pH: 5 (ref 5.0–8.0)

## 2020-10-16 LAB — CBC WITH DIFFERENTIAL/PLATELET
Abs Immature Granulocytes: 0.02 10*3/uL (ref 0.00–0.07)
Basophils Absolute: 0.1 10*3/uL (ref 0.0–0.1)
Basophils Relative: 1 %
Eosinophils Absolute: 0.2 10*3/uL (ref 0.0–0.5)
Eosinophils Relative: 3 %
HCT: 37.1 % (ref 36.0–46.0)
Hemoglobin: 11.3 g/dL — ABNORMAL LOW (ref 12.0–15.0)
Immature Granulocytes: 0 %
Lymphocytes Relative: 17 %
Lymphs Abs: 0.9 10*3/uL (ref 0.7–4.0)
MCH: 29.3 pg (ref 26.0–34.0)
MCHC: 30.5 g/dL (ref 30.0–36.0)
MCV: 96.1 fL (ref 80.0–100.0)
Monocytes Absolute: 0.4 10*3/uL (ref 0.1–1.0)
Monocytes Relative: 8 %
Neutro Abs: 3.7 10*3/uL (ref 1.7–7.7)
Neutrophils Relative %: 71 %
Platelets: 192 10*3/uL (ref 150–400)
RBC: 3.86 MIL/uL — ABNORMAL LOW (ref 3.87–5.11)
RDW: 13.2 % (ref 11.5–15.5)
WBC: 5.3 10*3/uL (ref 4.0–10.5)
nRBC: 0 % (ref 0.0–0.2)

## 2020-10-16 LAB — BASIC METABOLIC PANEL
Anion gap: 8 (ref 5–15)
BUN: 37 mg/dL — ABNORMAL HIGH (ref 8–23)
CO2: 28 mmol/L (ref 22–32)
Calcium: 9.6 mg/dL (ref 8.9–10.3)
Chloride: 104 mmol/L (ref 98–111)
Creatinine, Ser: 1.13 mg/dL — ABNORMAL HIGH (ref 0.44–1.00)
GFR, Estimated: 48 mL/min — ABNORMAL LOW (ref >60)
Glucose, Bld: 123 mg/dL — ABNORMAL HIGH (ref 70–99)
Potassium: 4.6 mmol/L (ref 3.5–5.1)
Sodium: 140 mmol/L (ref 135–145)

## 2020-10-16 LAB — HEMOGLOBIN A1C
Hgb A1c MFr Bld: 5.7 % — ABNORMAL HIGH (ref 4.8–5.6)
Mean Plasma Glucose: 116.89 mg/dL

## 2020-10-16 LAB — PROTIME-INR
INR: 0.9 (ref 0.8–1.2)
Prothrombin Time: 12.4 seconds (ref 11.4–15.2)

## 2020-10-16 LAB — SURGICAL PCR SCREEN
MRSA, PCR: NEGATIVE
Staphylococcus aureus: NEGATIVE

## 2020-10-16 LAB — APTT: aPTT: 32 seconds (ref 24–36)

## 2020-10-16 NOTE — Progress Notes (Signed)
COVID Vaccine Completed:No Date COVID Vaccine completed: COVID vaccine manufacturer: Pfizer    [facility]   PCP - Dr. Andee Poles Cardiologist - none   Chest x-ray - 10/16/20-epic EKG - 10/16/20-epic Stress Test - no ECHO - no Cardiac Cath - no Pacemaker/ICD device last checked:NA  Sleep Study - no CPAP -   Fasting Blood Sugar - MA pre diabetic Checks Blood Sugar _____ times a day  Blood Thinner Instructions:NA Aspirin Instructions: Last Dose:  Anesthesia review:   Patient denies shortness of breath, fever, cough and chest pain at PAT appointment Yes. No SOB with ADLs. Pt uses walker at home and has a POA care giver.  Patient verbalized understanding of instructions that were given to them at the PAT appointment. Patient was also instructed that they will need to review over the PAT instructions again at home before surgery.Yes

## 2020-10-20 ENCOUNTER — Other Ambulatory Visit (HOSPITAL_COMMUNITY)
Admission: RE | Admit: 2020-10-20 | Discharge: 2020-10-20 | Disposition: A | Discharge status: Home / Self Care | Payer: Medicare PPO | Source: Ambulatory Visit | Attending: Orthopedic Surgery | Admitting: Orthopedic Surgery

## 2020-10-20 DIAGNOSIS — M1612 Unilateral primary osteoarthritis, left hip: Secondary | ICD-10-CM | POA: Diagnosis present

## 2020-10-20 DIAGNOSIS — Z7982 Long term (current) use of aspirin: Secondary | ICD-10-CM | POA: Diagnosis not present

## 2020-10-20 DIAGNOSIS — F419 Anxiety disorder, unspecified: Secondary | ICD-10-CM | POA: Diagnosis present

## 2020-10-20 DIAGNOSIS — Z8673 Personal history of transient ischemic attack (TIA), and cerebral infarction without residual deficits: Secondary | ICD-10-CM | POA: Diagnosis not present

## 2020-10-20 DIAGNOSIS — I10 Essential (primary) hypertension: Secondary | ICD-10-CM | POA: Diagnosis present

## 2020-10-20 DIAGNOSIS — Z90710 Acquired absence of both cervix and uterus: Secondary | ICD-10-CM | POA: Diagnosis not present

## 2020-10-20 DIAGNOSIS — Z9181 History of falling: Secondary | ICD-10-CM | POA: Diagnosis not present

## 2020-10-20 DIAGNOSIS — H9190 Unspecified hearing loss, unspecified ear: Secondary | ICD-10-CM | POA: Diagnosis present

## 2020-10-20 DIAGNOSIS — M150 Primary generalized (osteo)arthritis: Secondary | ICD-10-CM | POA: Diagnosis not present

## 2020-10-20 DIAGNOSIS — M25752 Osteophyte, left hip: Secondary | ICD-10-CM | POA: Diagnosis present

## 2020-10-20 DIAGNOSIS — K08109 Complete loss of teeth, unspecified cause, unspecified class: Secondary | ICD-10-CM | POA: Diagnosis present

## 2020-10-20 DIAGNOSIS — Z79899 Other long term (current) drug therapy: Secondary | ICD-10-CM | POA: Diagnosis not present

## 2020-10-20 DIAGNOSIS — Z20822 Contact with and (suspected) exposure to covid-19: Secondary | ICD-10-CM | POA: Diagnosis present

## 2020-10-20 DIAGNOSIS — Z471 Aftercare following joint replacement surgery: Secondary | ICD-10-CM | POA: Diagnosis not present

## 2020-10-20 DIAGNOSIS — Z01812 Encounter for preprocedural laboratory examination: Secondary | ICD-10-CM | POA: Insufficient documentation

## 2020-10-20 DIAGNOSIS — F32A Depression, unspecified: Secondary | ICD-10-CM | POA: Diagnosis present

## 2020-10-20 DIAGNOSIS — R7303 Prediabetes: Secondary | ICD-10-CM | POA: Diagnosis present

## 2020-10-20 DIAGNOSIS — E782 Mixed hyperlipidemia: Secondary | ICD-10-CM | POA: Diagnosis present

## 2020-10-20 DIAGNOSIS — G4700 Insomnia, unspecified: Secondary | ICD-10-CM | POA: Diagnosis not present

## 2020-10-20 DIAGNOSIS — Z79890 Hormone replacement therapy (postmenopausal): Secondary | ICD-10-CM | POA: Diagnosis not present

## 2020-10-20 DIAGNOSIS — D62 Acute posthemorrhagic anemia: Secondary | ICD-10-CM | POA: Diagnosis not present

## 2020-10-20 DIAGNOSIS — E039 Hypothyroidism, unspecified: Secondary | ICD-10-CM | POA: Diagnosis present

## 2020-10-20 DIAGNOSIS — F0281 Dementia in other diseases classified elsewhere with behavioral disturbance: Secondary | ICD-10-CM | POA: Diagnosis not present

## 2020-10-20 DIAGNOSIS — K5900 Constipation, unspecified: Secondary | ICD-10-CM | POA: Diagnosis present

## 2020-10-20 DIAGNOSIS — F0390 Unspecified dementia without behavioral disturbance: Secondary | ICD-10-CM | POA: Diagnosis present

## 2020-10-20 DIAGNOSIS — Z96642 Presence of left artificial hip joint: Secondary | ICD-10-CM | POA: Diagnosis not present

## 2020-10-21 LAB — SARS CORONAVIRUS 2 (TAT 6-24 HRS): SARS Coronavirus 2: NEGATIVE

## 2020-10-21 MED ORDER — BUPIVACAINE LIPOSOME 1.3 % IJ SUSP
10.0000 mL | Freq: Once | INTRAMUSCULAR | Status: DC
  Filled 2020-10-21: qty 10

## 2020-10-21 MED ORDER — TRANEXAMIC ACID 1000 MG/10ML IV SOLN
2000.0000 mg | INTRAVENOUS | Status: DC
  Filled 2020-10-21: qty 20

## 2020-10-21 NOTE — Progress Notes (Signed)
Called patient's grand daughter, [name], about time change for her grandmother's surgery. Patient to arrive 0830 on 10/22/20 for 11 AM surgery. She verbalizes understanding.

## 2020-10-22 ENCOUNTER — Inpatient Hospital Stay (HOSPITAL_COMMUNITY): Payer: Medicare PPO | Admitting: Registered Nurse

## 2020-10-22 ENCOUNTER — Encounter (HOSPITAL_COMMUNITY): Payer: Self-pay | Admitting: Orthopedic Surgery

## 2020-10-22 ENCOUNTER — Other Ambulatory Visit: Payer: Self-pay

## 2020-10-22 ENCOUNTER — Inpatient Hospital Stay (HOSPITAL_COMMUNITY)
Admission: RE | Admit: 2020-10-22 | Discharge: 2020-10-23 | DRG: 470 | Disposition: A | Discharge status: Home / Self Care | Payer: Medicare PPO | Attending: Orthopedic Surgery | Admitting: Orthopedic Surgery

## 2020-10-22 ENCOUNTER — Encounter (HOSPITAL_COMMUNITY)
Admission: RE | Disposition: A | Discharge status: Home / Self Care | Payer: Self-pay | Source: Home / Self Care | Attending: Orthopedic Surgery

## 2020-10-22 ENCOUNTER — Inpatient Hospital Stay (HOSPITAL_COMMUNITY): Payer: Medicare PPO

## 2020-10-22 DIAGNOSIS — E039 Hypothyroidism, unspecified: Secondary | ICD-10-CM | POA: Diagnosis present

## 2020-10-22 DIAGNOSIS — M25752 Osteophyte, left hip: Secondary | ICD-10-CM | POA: Diagnosis present

## 2020-10-22 DIAGNOSIS — E782 Mixed hyperlipidemia: Secondary | ICD-10-CM | POA: Diagnosis present

## 2020-10-22 DIAGNOSIS — K08109 Complete loss of teeth, unspecified cause, unspecified class: Secondary | ICD-10-CM | POA: Diagnosis present

## 2020-10-22 DIAGNOSIS — Z8673 Personal history of transient ischemic attack (TIA), and cerebral infarction without residual deficits: Secondary | ICD-10-CM

## 2020-10-22 DIAGNOSIS — Z20822 Contact with and (suspected) exposure to covid-19: Secondary | ICD-10-CM | POA: Diagnosis present

## 2020-10-22 DIAGNOSIS — Z90710 Acquired absence of both cervix and uterus: Secondary | ICD-10-CM

## 2020-10-22 DIAGNOSIS — H9190 Unspecified hearing loss, unspecified ear: Secondary | ICD-10-CM | POA: Diagnosis present

## 2020-10-22 DIAGNOSIS — Z96642 Presence of left artificial hip joint: Secondary | ICD-10-CM

## 2020-10-22 DIAGNOSIS — F0390 Unspecified dementia without behavioral disturbance: Secondary | ICD-10-CM | POA: Diagnosis present

## 2020-10-22 DIAGNOSIS — Z79899 Other long term (current) drug therapy: Secondary | ICD-10-CM

## 2020-10-22 DIAGNOSIS — Z7982 Long term (current) use of aspirin: Secondary | ICD-10-CM

## 2020-10-22 DIAGNOSIS — Z471 Aftercare following joint replacement surgery: Secondary | ICD-10-CM | POA: Diagnosis not present

## 2020-10-22 DIAGNOSIS — Z79890 Hormone replacement therapy (postmenopausal): Secondary | ICD-10-CM

## 2020-10-22 DIAGNOSIS — K5900 Constipation, unspecified: Secondary | ICD-10-CM | POA: Diagnosis present

## 2020-10-22 DIAGNOSIS — F419 Anxiety disorder, unspecified: Secondary | ICD-10-CM | POA: Diagnosis present

## 2020-10-22 DIAGNOSIS — M1612 Unilateral primary osteoarthritis, left hip: Principal | ICD-10-CM | POA: Diagnosis present

## 2020-10-22 DIAGNOSIS — D62 Acute posthemorrhagic anemia: Secondary | ICD-10-CM | POA: Diagnosis not present

## 2020-10-22 DIAGNOSIS — Z9181 History of falling: Secondary | ICD-10-CM

## 2020-10-22 DIAGNOSIS — F32A Depression, unspecified: Secondary | ICD-10-CM | POA: Diagnosis present

## 2020-10-22 DIAGNOSIS — I10 Essential (primary) hypertension: Secondary | ICD-10-CM | POA: Diagnosis present

## 2020-10-22 DIAGNOSIS — R7303 Prediabetes: Secondary | ICD-10-CM | POA: Diagnosis present

## 2020-10-22 DIAGNOSIS — Z419 Encounter for procedure for purposes other than remedying health state, unspecified: Secondary | ICD-10-CM

## 2020-10-22 HISTORY — PX: TOTAL HIP ARTHROPLASTY: SHX124

## 2020-10-22 LAB — TYPE AND SCREEN
ABO/RH(D): A POS
Antibody Screen: NEGATIVE

## 2020-10-22 SURGERY — ARTHROPLASTY, HIP, TOTAL, ANTERIOR APPROACH
Anesthesia: Spinal | Site: Hip | Laterality: Left

## 2020-10-22 MED ORDER — EPHEDRINE 5 MG/ML INJ
INTRAVENOUS | Status: AC
  Filled 2020-10-22: qty 10

## 2020-10-22 MED ORDER — OXYCODONE HCL 5 MG PO TABS: 5.0000 mg | ORAL_TABLET | ORAL | Status: DC | PRN

## 2020-10-22 MED ORDER — MENTHOL 3 MG MT LOZG: 1.0000 | LOZENGE | OROMUCOSAL | Status: DC | PRN

## 2020-10-22 MED ORDER — MEPIVACAINE HCL (PF) 2 % IJ SOLN
INTRAMUSCULAR | Status: DC | PRN
  Administered 2020-10-22: 2.5 mL via INTRATHECAL

## 2020-10-22 MED ORDER — ONDANSETRON HCL 4 MG/2ML IJ SOLN
INTRAMUSCULAR | Status: AC
  Filled 2020-10-22: qty 2

## 2020-10-22 MED ORDER — ASPIRIN EC 81 MG PO TBEC: 81.0000 mg | DELAYED_RELEASE_TABLET | Freq: Two times a day (BID) | ORAL | 0 refills | Status: DC

## 2020-10-22 MED ORDER — ONDANSETRON HCL 4 MG/2ML IJ SOLN
INTRAMUSCULAR | Status: DC | PRN
  Administered 2020-10-22: 4 mg via INTRAVENOUS

## 2020-10-22 MED ORDER — DEXAMETHASONE SODIUM PHOSPHATE 10 MG/ML IJ SOLN: 10.0000 mg | Freq: Once | INTRAMUSCULAR | Status: DC

## 2020-10-22 MED ORDER — ORAL CARE MOUTH RINSE: 15.0000 mL | Freq: Once | OROMUCOSAL | Status: AC

## 2020-10-22 MED ORDER — MEMANTINE HCL 10 MG PO TABS
5.0000 mg | ORAL_TABLET | Freq: Every day | ORAL | Status: DC
  Administered 2020-10-23: 5 mg via ORAL
  Filled 2020-10-22: qty 1

## 2020-10-22 MED ORDER — STERILE WATER FOR IRRIGATION IR SOLN
Status: DC | PRN
  Administered 2020-10-22 (×2): 1000 mL

## 2020-10-22 MED ORDER — DIPHENHYDRAMINE HCL 12.5 MG/5ML PO ELIX: 12.5000 mg | ORAL_SOLUTION | ORAL | Status: DC | PRN

## 2020-10-22 MED ORDER — PHENYLEPHRINE HCL-NACL 20-0.9 MG/250ML-% IV SOLN
INTRAVENOUS | Status: DC | PRN
  Administered 2020-10-22: 15 ug/min via INTRAVENOUS

## 2020-10-22 MED ORDER — SODIUM CHLORIDE 0.9% FLUSH
INTRAVENOUS | Status: DC | PRN
  Administered 2020-10-22: 50 mL

## 2020-10-22 MED ORDER — LACTATED RINGERS IV SOLN: INTRAVENOUS | Status: DC

## 2020-10-22 MED ORDER — CHLORHEXIDINE GLUCONATE 0.12 % MT SOLN
15.0000 mL | Freq: Once | OROMUCOSAL | Status: AC
  Administered 2020-10-22: 15 mL via OROMUCOSAL

## 2020-10-22 MED ORDER — OXYCODONE HCL 5 MG PO TABS: 10.0000 mg | ORAL_TABLET | ORAL | Status: DC | PRN

## 2020-10-22 MED ORDER — OLANZAPINE 10 MG PO TABS: 10.0000 mg | ORAL_TABLET | ORAL | Status: DC

## 2020-10-22 MED ORDER — BUPIVACAINE LIPOSOME 1.3 % IJ SUSP
INTRAMUSCULAR | Status: DC | PRN
  Administered 2020-10-22: 10 mL

## 2020-10-22 MED ORDER — TRANEXAMIC ACID-NACL 1000-0.7 MG/100ML-% IV SOLN: 1000.0000 mg | Freq: Once | INTRAVENOUS | Status: DC

## 2020-10-22 MED ORDER — PANTOPRAZOLE SODIUM 40 MG PO TBEC
40.0000 mg | DELAYED_RELEASE_TABLET | Freq: Every day | ORAL | Status: DC
  Administered 2020-10-23: 40 mg via ORAL
  Filled 2020-10-22: qty 1

## 2020-10-22 MED ORDER — POLYETHYLENE GLYCOL 3350 17 G PO PACK: 17.0000 g | PACK | Freq: Every day | ORAL | Status: DC | PRN

## 2020-10-22 MED ORDER — METHOCARBAMOL 500 MG IVPB - SIMPLE MED
500.0000 mg | Freq: Four times a day (QID) | INTRAVENOUS | Status: DC | PRN
  Filled 2020-10-22: qty 50

## 2020-10-22 MED ORDER — TRAZODONE HCL 100 MG PO TABS
100.0000 mg | ORAL_TABLET | Freq: Every day | ORAL | Status: DC
  Administered 2020-10-22: 100 mg via ORAL
  Filled 2020-10-22: qty 1

## 2020-10-22 MED ORDER — METOCLOPRAMIDE HCL 5 MG PO TABS: 5.0000 mg | ORAL_TABLET | Freq: Three times a day (TID) | ORAL | Status: DC | PRN

## 2020-10-22 MED ORDER — OLANZAPINE 10 MG PO TABS
10.0000 mg | ORAL_TABLET | Freq: Every day | ORAL | Status: DC
  Administered 2020-10-22: 10 mg via ORAL
  Filled 2020-10-22: qty 1

## 2020-10-22 MED ORDER — PHENOL 1.4 % MT LIQD: 1.0000 | OROMUCOSAL | Status: DC | PRN

## 2020-10-22 MED ORDER — METOPROLOL SUCCINATE ER 25 MG PO TB24
25.0000 mg | ORAL_TABLET | Freq: Every day | ORAL | Status: DC
  Administered 2020-10-23: 25 mg via ORAL
  Filled 2020-10-22: qty 1

## 2020-10-22 MED ORDER — DOCUSATE SODIUM 100 MG PO CAPS
100.0000 mg | ORAL_CAPSULE | Freq: Two times a day (BID) | ORAL | Status: DC
  Administered 2020-10-22 – 2020-10-23 (×2): 100 mg via ORAL
  Filled 2020-10-22 (×2): qty 1

## 2020-10-22 MED ORDER — DEXAMETHASONE SODIUM PHOSPHATE 10 MG/ML IJ SOLN
INTRAMUSCULAR | Status: AC
  Filled 2020-10-22: qty 1

## 2020-10-22 MED ORDER — ACETAMINOPHEN 500 MG PO TABS
1000.0000 mg | ORAL_TABLET | Freq: Four times a day (QID) | ORAL | Status: DC
  Administered 2020-10-22 – 2020-10-23 (×3): 1000 mg via ORAL
  Filled 2020-10-22 (×3): qty 2

## 2020-10-22 MED ORDER — BISACODYL 5 MG PO TBEC: 5.0000 mg | DELAYED_RELEASE_TABLET | Freq: Every day | ORAL | Status: DC | PRN

## 2020-10-22 MED ORDER — ONDANSETRON HCL 4 MG PO TABS: 4.0000 mg | ORAL_TABLET | Freq: Four times a day (QID) | ORAL | Status: DC | PRN

## 2020-10-22 MED ORDER — 0.9 % SODIUM CHLORIDE (POUR BTL) OPTIME
TOPICAL | Status: DC | PRN
  Administered 2020-10-22: 1000 mL

## 2020-10-22 MED ORDER — LEVOTHYROXINE SODIUM 50 MCG PO TABS
50.0000 ug | ORAL_TABLET | Freq: Every day | ORAL | Status: DC
  Administered 2020-10-23: 50 ug via ORAL
  Filled 2020-10-22: qty 1

## 2020-10-22 MED ORDER — ACETAMINOPHEN 500 MG PO TABS
1000.0000 mg | ORAL_TABLET | Freq: Once | ORAL | Status: AC
  Administered 2020-10-22: 1000 mg via ORAL

## 2020-10-22 MED ORDER — BUPIVACAINE-EPINEPHRINE 0.25% -1:200000 IJ SOLN
INTRAMUSCULAR | Status: DC | PRN
  Administered 2020-10-22: 30 mL

## 2020-10-22 MED ORDER — POVIDONE-IODINE 10 % EX SWAB
2.0000 "application " | Freq: Once | CUTANEOUS | Status: AC
  Administered 2020-10-22: 2 via TOPICAL

## 2020-10-22 MED ORDER — LACTATED RINGERS IV BOLUS
500.0000 mL | Freq: Once | INTRAVENOUS | Status: AC
  Administered 2020-10-22: 500 mL via INTRAVENOUS

## 2020-10-22 MED ORDER — GABAPENTIN 100 MG PO CAPS
200.0000 mg | ORAL_CAPSULE | Freq: Every day | ORAL | Status: DC
  Administered 2020-10-22: 200 mg via ORAL
  Filled 2020-10-22: qty 2

## 2020-10-22 MED ORDER — PROPOFOL 500 MG/50ML IV EMUL
INTRAVENOUS | Status: DC | PRN
  Administered 2020-10-22: 40 ug/kg/min via INTRAVENOUS

## 2020-10-22 MED ORDER — HYDROMORPHONE HCL 1 MG/ML IJ SOLN: 0.5000 mg | INTRAMUSCULAR | Status: DC | PRN

## 2020-10-22 MED ORDER — PROPOFOL 1000 MG/100ML IV EMUL
INTRAVENOUS | Status: AC
  Filled 2020-10-22: qty 100

## 2020-10-22 MED ORDER — TIZANIDINE HCL 2 MG PO TABS: 2.0000 mg | ORAL_TABLET | Freq: Four times a day (QID) | ORAL | 0 refills | Status: DC | PRN

## 2020-10-22 MED ORDER — EPHEDRINE SULFATE-NACL 50-0.9 MG/10ML-% IV SOSY
PREFILLED_SYRINGE | INTRAVENOUS | Status: DC | PRN
  Administered 2020-10-22 (×2): 5 mg via INTRAVENOUS

## 2020-10-22 MED ORDER — TRANEXAMIC ACID-NACL 1000-0.7 MG/100ML-% IV SOLN
1000.0000 mg | INTRAVENOUS | Status: AC
  Administered 2020-10-22: 1000 mg via INTRAVENOUS
  Filled 2020-10-22: qty 100

## 2020-10-22 MED ORDER — GABAPENTIN 100 MG PO CAPS
100.0000 mg | ORAL_CAPSULE | ORAL | Status: DC
Start: 2020-10-22 — End: 2020-10-22

## 2020-10-22 MED ORDER — CEFAZOLIN SODIUM-DEXTROSE 2-4 GM/100ML-% IV SOLN
2.0000 g | INTRAVENOUS | Status: AC
  Administered 2020-10-22: 2 g via INTRAVENOUS
  Filled 2020-10-22: qty 100

## 2020-10-22 MED ORDER — OLANZAPINE 5 MG PO TABS
5.0000 mg | ORAL_TABLET | Freq: Every day | ORAL | Status: DC
  Administered 2020-10-23: 5 mg via ORAL
  Filled 2020-10-22: qty 1

## 2020-10-22 MED ORDER — ASPIRIN 81 MG PO CHEW
81.0000 mg | CHEWABLE_TABLET | Freq: Two times a day (BID) | ORAL | Status: DC
Start: 2020-10-22 — End: 2020-10-23
  Administered 2020-10-22 – 2020-10-23 (×2): 81 mg via ORAL
  Filled 2020-10-22 (×2): qty 1

## 2020-10-22 MED ORDER — TRANEXAMIC ACID 1000 MG/10ML IV SOLN
INTRAVENOUS | Status: DC | PRN
  Administered 2020-10-22: 2000 mg via TOPICAL

## 2020-10-22 MED ORDER — METOCLOPRAMIDE HCL 5 MG/ML IJ SOLN: 5.0000 mg | Freq: Three times a day (TID) | INTRAMUSCULAR | Status: DC | PRN

## 2020-10-22 MED ORDER — GABAPENTIN 100 MG PO CAPS
100.0000 mg | ORAL_CAPSULE | Freq: Every day | ORAL | Status: DC
  Administered 2020-10-23: 100 mg via ORAL
  Filled 2020-10-22: qty 1

## 2020-10-22 MED ORDER — ONDANSETRON HCL 4 MG/2ML IJ SOLN: 4.0000 mg | Freq: Four times a day (QID) | INTRAMUSCULAR | Status: DC | PRN

## 2020-10-22 MED ORDER — ACETAMINOPHEN 325 MG PO TABS: 325.0000 mg | ORAL_TABLET | Freq: Four times a day (QID) | ORAL | Status: DC | PRN

## 2020-10-22 MED ORDER — METHOCARBAMOL 500 MG PO TABS
500.0000 mg | ORAL_TABLET | Freq: Four times a day (QID) | ORAL | Status: DC | PRN
  Administered 2020-10-22 – 2020-10-23 (×2): 500 mg via ORAL
  Filled 2020-10-22 (×2): qty 1

## 2020-10-22 MED ORDER — SERTRALINE HCL 50 MG PO TABS
50.0000 mg | ORAL_TABLET | Freq: Every day | ORAL | Status: DC
  Administered 2020-10-23: 50 mg via ORAL
  Filled 2020-10-22: qty 1

## 2020-10-22 MED ORDER — FLEET ENEMA 7-19 GM/118ML RE ENEM: 1.0000 | ENEMA | Freq: Once | RECTAL | Status: DC | PRN

## 2020-10-22 MED ORDER — DEXAMETHASONE SODIUM PHOSPHATE 10 MG/ML IJ SOLN
INTRAMUSCULAR | Status: DC | PRN
  Administered 2020-10-22: 8 mg via INTRAVENOUS

## 2020-10-22 MED ORDER — ACETAMINOPHEN 500 MG PO TABS
ORAL_TABLET | ORAL | Status: AC
  Filled 2020-10-22: qty 2

## 2020-10-22 MED ORDER — KCL IN DEXTROSE-NACL 20-5-0.45 MEQ/L-%-% IV SOLN
INTRAVENOUS | Status: DC
  Filled 2020-10-22 (×4): qty 1000

## 2020-10-22 MED ORDER — BUPIVACAINE-EPINEPHRINE (PF) 0.25% -1:200000 IJ SOLN
INTRAMUSCULAR | Status: AC
  Filled 2020-10-22: qty 30

## 2020-10-22 MED ORDER — LACTATED RINGERS IV BOLUS
250.0000 mL | Freq: Once | INTRAVENOUS | Status: AC
  Administered 2020-10-22: 250 mL via INTRAVENOUS

## 2020-10-22 MED ORDER — OXYCODONE-ACETAMINOPHEN 5-325 MG PO TABS: 1.0000 | ORAL_TABLET | ORAL | 0 refills | Status: DC | PRN

## 2020-10-22 SURGICAL SUPPLY — 45 items
BAG DECANTER FOR FLEXI CONT (MISCELLANEOUS) ×2 IMPLANT
BLADE SAW SGTL 18X1.27X75 (BLADE) ×2 IMPLANT
BLADE SURG SZ10 CARB STEEL (BLADE) ×4 IMPLANT
CNTNR URN SCR LID CUP LEK RST (MISCELLANEOUS) ×1 IMPLANT
CONT SPEC 4OZ STRL OR WHT (MISCELLANEOUS) ×2
COVER PERINEAL POST (MISCELLANEOUS) ×2 IMPLANT
COVER SURGICAL LIGHT HANDLE (MISCELLANEOUS) ×2 IMPLANT
COVER WAND RF STERILE (DRAPES) ×2 IMPLANT
CUP ACETBLR 48 OD 100 SERIES (Hips) ×1 IMPLANT
DECANTER SPIKE VIAL GLASS SM (MISCELLANEOUS) ×4 IMPLANT
DRAPE STERI IOBAN 125X83 (DRAPES) ×2 IMPLANT
DRAPE U-SHAPE 47X51 STRL (DRAPES) ×4 IMPLANT
DRSG AQUACEL AG ADV 3.5X10 (GAUZE/BANDAGES/DRESSINGS) ×2 IMPLANT
DURAPREP 26ML APPLICATOR (WOUND CARE) ×2 IMPLANT
ELECT BLADE TIP CTD 4 INCH (ELECTRODE) ×2 IMPLANT
ELECT REM PT RETURN 15FT ADLT (MISCELLANEOUS) ×2 IMPLANT
ELIMINATOR HOLE APEX DEPUY (Hips) ×1 IMPLANT
GLOVE SRG 8 PF TXTR STRL LF DI (GLOVE) ×1 IMPLANT
GLOVE SURG ENC MOIS LTX SZ7.5 (GLOVE) ×2 IMPLANT
GLOVE SURG ENC MOIS LTX SZ8.5 (GLOVE) ×2 IMPLANT
GLOVE SURG UNDER POLY LF SZ8 (GLOVE) ×2
GLOVE SURG UNDER POLY LF SZ9 (GLOVE) ×2 IMPLANT
GOWN STRL REUS W/TWL XL LVL3 (GOWN DISPOSABLE) ×4 IMPLANT
HEAD FEM STD 32X+1 STRL (Hips) ×1 IMPLANT
HOLDER FOLEY CATH W/STRAP (MISCELLANEOUS) ×2 IMPLANT
KIT TURNOVER KIT A (KITS) ×2 IMPLANT
MANIFOLD NEPTUNE II (INSTRUMENTS) ×2 IMPLANT
NDL HYPO 21X1.5 SAFETY (NEEDLE) ×2 IMPLANT
NEEDLE HYPO 21X1.5 SAFETY (NEEDLE) ×4 IMPLANT
NS IRRIG 1000ML POUR BTL (IV SOLUTION) ×2 IMPLANT
PACK ANTERIOR HIP CUSTOM (KITS) ×2 IMPLANT
PENCIL SMOKE EVACUATOR (MISCELLANEOUS) IMPLANT
PINN ALTRX NEUT ID X OD 32X48 ×1 IMPLANT
STEM FEMORAL SZ 5MM STD ACTIS (Stem) ×1 IMPLANT
SUT ETHIBOND NAB CT1 #1 30IN (SUTURE) ×2 IMPLANT
SUT VIC AB 0 CT1 27 (SUTURE)
SUT VIC AB 0 CT1 27XBRD ANBCTR (SUTURE) IMPLANT
SUT VIC AB 1 CTX 36 (SUTURE) ×2
SUT VIC AB 1 CTX36XBRD ANBCTR (SUTURE) ×1 IMPLANT
SUT VIC AB 2-0 CT1 27 (SUTURE)
SUT VIC AB 2-0 CT1 TAPERPNT 27 (SUTURE) IMPLANT
SUT VIC AB 3-0 CT1 27 (SUTURE) ×2
SUT VIC AB 3-0 CT1 TAPERPNT 27 (SUTURE) ×1 IMPLANT
SYR CONTROL 10ML LL (SYRINGE) ×6 IMPLANT
TRAY FOLEY MTR SLVR 16FR STAT (SET/KITS/TRAYS/PACK) IMPLANT

## 2020-10-22 NOTE — Evaluation (Signed)
Physical Therapy Evaluation Patient Details Name: [name] MRN: [id number] DOB: Oct 30, 1935 Today's Date: 10/22/2020   History of Present Illness  Patient is 85 y.o. female s/p Lt THA anterior approach on 10/22/20 with PMH significant for stroke, HTN, OA, thyroid disease, depression, anxiety, and dementia.    Clinical Impression  [name] is a 85 y.o. female POD 0 s/p Lt THA. Patient reports modified independence with RW for mobility at baseline and intermittent assist from granddaughter for ADL's. Patient is now limited by functional impairments (see PT problem list below) and requires min assist/gaurd for bed mobility and transfers with RW. Patient was limited this session by orthostatic hypotension, pt reported slight dizziness on first stand that resolved with sitting and denied fureehr dizziness despite continued decrease in BP. Pt required min assist to return to supine and further mobility deferred 2/2 hypotension (see below) Patient will benefit from continued skilled PT interventions to address impairments and progress towards PLOF. Acute PT will follow to progress mobility and stair training in preparation for safe discharge home.   VITALS: Time                    Position                       BP (mmHg) 1500                     Supine                          124/55 1537                     Seated (after stand)     84/57 1540                     Seated (after rest)        70/54 1544                     Supine                          122/59          Follow Up Recommendations Follow surgeon's recommendation for DC plan and follow-up therapies;Home health PT    Equipment Recommendations  None recommended by PT    Recommendations for Other Services       Precautions / Restrictions Precautions Precautions: Fall Restrictions Weight Bearing Restrictions: No Other Position/Activity Restrictions: WBAT      Mobility  Bed Mobility Overal bed mobility: Needs  Assistance Bed Mobility: Supine to Sit;Sit to Supine     Supine to sit: HOB elevated;Min guard Sit to supine: Min assist;HOB elevated   General bed mobility comments: pt taking extra time and HOB slightly elevated but able to bring LE's off EOB and raise trunk with guarding for safety. Pt not symptomatic however BP dropped sitting EOB and min assist required to bring LE's back into bed and return to supine.    Transfers Overall transfer level: Needs assistance Equipment used: Rolling walker (2 wheeled) Transfers: Sit to/from Stand Sit to Stand: Min guard;From elevated surface         General transfer comment: close guarding for safety and min cues for safe hand placement on RW with rise from EOB. pt reported slight dizziness on first stand and BP noted  to drop from 124/55 to 84/57. pt returned to sit EOB and reported symptoms resolved. After ~3 mins sitting and no symptoms BP dropped further to 70/54 and pt attempted to stand again but was instructed to return to supine for safety.  Ambulation/Gait                Stairs            Wheelchair Mobility    Modified Rankin (Stroke Patients Only)       Balance Overall balance assessment: Needs assistance Sitting-balance support: Feet supported Sitting balance-Leahy Scale: Fair     Standing balance support: During functional activity;Bilateral upper extremity supported Standing balance-Leahy Scale: Fair Standing balance comment: pt abe to stand with hands on/off RW at EOB. cues for use of walker.                             Pertinent Vitals/Pain Pain Assessment: No/denies pain    Home Living Family/patient expects to be discharged to:: Private residence Living Arrangements: Children Available Help at Discharge: Family Type of Home: House Home Access: Stairs to enter Entrance Stairs-Rails: None Secretary/administrator of Steps: 1 Home Layout: Two level;Bed/bath upstairs Home Equipment: Shower  seat;Bedside commode;Walker - 2 wheels Additional Comments: pt's daughter and granddaughter help her at home; pts granddaughter is the full time caregiver    Prior Function Level of Independence: Independent with assistive device(s);Needs assistance   Gait / Transfers Assistance Needed: pt uses RW for gait at baseline  ADL's / Homemaking Assistance Needed: pts granddaughter assists with getting in/out of shower. pt able to dress and bathe self while sitting.        Hand Dominance   Dominant Hand: Right    Extremity/Trunk Assessment   Upper Extremity Assessment Upper Extremity Assessment: Overall WFL for tasks assessed    Lower Extremity Assessment Lower Extremity Assessment: Generalized weakness    Cervical / Trunk Assessment Cervical / Trunk Assessment: Normal  Communication   Communication: HOH  Cognition Arousal/Alertness: Awake/alert Behavior During Therapy: WFL for tasks assessed/performed Overall Cognitive Status: History of cognitive impairments - at baseline                                 General Comments: pt is pleasantly confused and granddaughter present confirming this is pt's baseline.      General Comments      Exercises     Assessment/Plan    PT Assessment Patient needs continued PT services  PT Problem List Decreased strength;Decreased range of motion;Decreased activity tolerance;Decreased balance;Decreased mobility;Decreased knowledge of use of DME       PT Treatment Interventions DME instruction;Gait training;Stair training;Functional mobility training;Therapeutic activities;Therapeutic exercise;Patient/family education    PT Goals (Current goals can be found in the Care Plan section)  Acute Rehab PT Goals Patient Stated Goal: get back to independence PT Goal Formulation: With patient Time For Goal Achievement: 10/29/20 Potential to Achieve Goals: Good    Frequency 7X/week   Barriers to discharge        Co-evaluation                AM-PAC PT "6 Clicks" Mobility  Outcome Measure Help needed turning from your back to your side while in a flat bed without using bedrails?: None Help needed moving from lying on your back to sitting on the side of a flat bed without  using bedrails?: A Little Help needed moving to and from a bed to a chair (including a wheelchair)?: A Little Help needed standing up from a chair using your arms (e.g., wheelchair or bedside chair)?: A Little Help needed to walk in hospital room?: A Lot Help needed climbing 3-5 steps with a railing? : A Lot 6 Click Score: 17    End of Session Equipment Utilized During Treatment: Gait belt Activity Tolerance: Treatment limited secondary to medical complications (Comment) (orthostatic hypotension) Patient left: with call bell/phone within reach;in bed;with family/visitor present Nurse Communication: Mobility status PT Visit Diagnosis: Muscle weakness (generalized) (M62.81);Difficulty in walking, not elsewhere classified (R26.2)    Time: 6387-5643 PT Time Calculation (min) (ACUTE ONLY): 19 min   Charges:   PT Evaluation $PT Eval Low Complexity: 1 Low          Wynn Maudlin, DPT Acute Rehabilitation Services Office [phone] Pager [phone]    Anitra Lauth 10/22/2020, 4:16 PM

## 2020-10-22 NOTE — Transfer of Care (Signed)
Immediate Anesthesia Transfer of Care Note  Patient: [name]  Procedure(s) Performed: LEFT TOTAL HIP ARTHROPLASTY ANTERIOR APPROACH (Left Hip)  Patient Location: PACU  Anesthesia Type:MAC and Spinal  Level of Consciousness: awake, alert , oriented and patient cooperative  Airway & Oxygen Therapy: Patient Spontanous Breathing and Patient connected to face mask oxygen  Post-op Assessment: Report given to RN, Post -op Vital signs reviewed and stable and Patient moving all extremities  Post vital signs: Reviewed and stable  Last Vitals:  Vitals Value Taken Time  BP    Temp    Pulse 66 10/22/20 1239  Resp 11 10/22/20 1239  SpO2 100 % 10/22/20 1239  Vitals shown include unvalidated device data.  Last Pain:  Vitals:   10/22/20 0904  TempSrc:   PainSc: 0-No pain      Patients Stated Pain Goal: 3 (74/12/87 8676)  Complications: No complications documented.

## 2020-10-22 NOTE — Op Note (Signed)
PATIENT ID:      PEYTIN DECHERT  MRN:     [id number] DOB/AGE:    Jan 12, 1936 / 85 y.o.  OPERATIVE REPORT   DATE OF PROCEDURE:  10/22/2020      PREOPERATIVE DIAGNOSIS:  LEFT HIP OSTOEARTHRITIS                                                         POSTOPERATIVE DIAGNOSIS:  Same                                                         PROCEDURE: Anterior Left total hip arthroplasty using a 48 mm DePuy Pinnacle  Cup, [employer], 0-degree polyethylene liner, a +1 mm x 3mm metal head, a 5 std Depuy Actis stem  SURGEON: Nestor Lewandowsky  ASSISTANT:   Tomi Likens. [employer]  (present throughout entire procedure and necessary for timely completion of the procedure)   ANESTHESIA: Spinal, Exparel 133mg  injection BLOOD LOSS: 300 cc FLUID REPLACEMENT: 1500 cc crystalloid TRANEXAMIC ACID: 1gm IV, 2gm Topical COMPLICATIONS: none    INDICATIONS FOR PROCEDURE: A 85 y.o. year-old With  LEFT HIP OSTOEARTHRITIS   for 3 years, x-rays show bone-on-bone arthritic changes, and osteophytes. Despite conservative measures with observation, anti-inflammatory medicine, narcotics, use of a cane, has severe unremitting pain and can ambulate only a few blocks before resting. Patient desires elective L total hip arthroplasty to decrease pain and increase function. The risks, benefits, and alternatives were discussed at length including but not limited to the risks of infection, bleeding, nerve injury, stiffness, blood clots, the need for revision surgery, cardiopulmonary complications, among others, and they were willing to proceed. Questions answered      PROCEDURE IN DETAIL: The patient was identified by armband,   received preoperative IV antibiotics in the holding area at [facility], taken to the operating room , appropriate anesthetic monitors   were attached and anesthesia was induced with the patient on the gurney. HANA boots were applied to the feet, and the patient  was transferred to the HANA table  with a peroneal post and support underneath the non-operative leg. Theoperative lower extremity was then prepped and draped in the usual sterile fashion from just above the iliac crest to the knee. And a timeout procedure was performed. [facility]. Tomi Likens [facility] was present and scrubbed throughout the case, critical for assistance with, positioning, exposure, retraction, instrumentation, and closure.Skin along incision area was injected with 10 cc of Exparel solution. We then made a 12 cm incision along the interval at the leading edge of the tensor fascia lata of starting at 2 cm lateral to the ASIS. Small bleeders in the skin and subcutaneous tissue identified and cauterized we dissected down to the fascia and made an incision in the fascia allowing [facility] to elevate the fascia of the tensor muscle and exploited the interval between the rectus and the tensor fascia lata. A Cobra retractor was then placed along the superior neck of the femur. A cerebellar retractor was used to expose the interval between the tensor fascia lata and the rectus femoris.  We identified  and cauterized the ascending branch of the anterior circumflex artery. A second Cobra retractor along the inferior neck of the femur. A small Hohmann retractor was placed underneath the origin of the rectus femoris, giving [place] good medial exposure. Using Ronguers fatty tissue was removed from in front of the anterior capsule. The capsule was then incised, starting out at the superior anterior rim of the acetabulum going laterally along the anterior neck. The capsule was then teed along the neck superiorly and inferiorly. Electrocautery was used to release capsule from the anterior and medial neck of the femur to allow external rotation. Cobra retractors were then placed along the inferior and superior neck allowing [place] to perform a standard neck cut and removed the femoral head with a power corkscrew. We then placed a medium bent homan retractor in the cotyloid  notch and posteriorly along the acetabular rim a narrow Cobra retractor. Exposed labral tissue and osteophytes were then removed. We then sequentially reamed up to a 47 mm basket reamer obtaining good coverage in all quadrants, verified by C-arm imaging. Under C-arm control we then hammered into place a 48 mm Pinnacle cup in 45 of abduction and 15 of anteversion. The cup seated nicely and required no supplemental screws. We then placed a central hole Eliminator and a 0 polyethylene liner. The foot was then externally rotated to 130-140. The limb was extended and adducted to the floor, delivering the proximal femur up into the wound. A medium curved Hohmann retractor was placed over the greater trochanter and a long Homan retractor along the posterior femoral neck completing the exposure and lateralizing the femur. We then performed releases superiorly and and inferiorly of the capsule going back to the pirformis fossa superiorly and to the lesser trochanter inferiorly. We then entered the proximal femur with the box cutting offset chisel followed by, a canal sounder, the chili pepper and broaching up to a 5 broach. This seated nicely and we reamed the calcar. A trial reduction was performed with a 1 mm X 32 mm head.The limb lengths were excellent the hip was stable in 90 of external rotation. At this point the trial components removed and we hammered into place a # 5std  Offset Actis stem with [employer] coating. A + 1 mm x 32 metal head was then hammered into place. The hip was reduced and final C-arm images obtained. The wound was thoroughly irrigated with normal saline solution. We repaired the ant capsule and the tensor fascia lot a with running 0 vicryl suture. the subcutaneous tissue was closed with 2-0 and 3-0 Vicryl suture followed by an Aquacil dressing. At this point the patient was awaken and transferred to hospital gurney without difficulty.   Nestor Lewandowsky 10/22/2020, 9:45 AM

## 2020-10-22 NOTE — Anesthesia Preprocedure Evaluation (Addendum)
Anesthesia Evaluation  Patient identified by MRN, date of birth, ID band Patient awake    Reviewed: Allergy & Precautions, NPO status , Patient's Chart, lab work & pertinent test results, reviewed documented beta blocker date and time   Airway Mallampati: II  TM Distance: >3 FB Neck ROM: Full    Dental  (+) Edentulous Upper, Edentulous Lower, Dental Advisory Given   Pulmonary neg pulmonary ROS,    Pulmonary exam normal breath sounds clear to auscultation       Cardiovascular hypertension, Pt. on home beta blockers and Pt. on medications negative cardio ROS Normal cardiovascular exam Rhythm:Regular Rate:Normal     Neuro/Psych PSYCHIATRIC DISORDERS Anxiety Depression Dementia CVA, No Residual Symptoms    GI/Hepatic negative GI ROS, Neg liver ROS,   Endo/Other  Hypothyroidism   Renal/GU negative Renal ROS  negative genitourinary   Musculoskeletal  (+) Arthritis ,   Abdominal   Peds  Hematology negative hematology ROS (+)   Anesthesia Other Findings   Reproductive/Obstetrics                            Anesthesia Physical Anesthesia Plan  ASA: III  Anesthesia Plan: Spinal   Post-op Pain Management:    Induction:   PONV Risk Score and Plan: 2 and Treatment may vary due to age or medical condition  Airway Management Planned: Natural Airway  Additional Equipment:   Intra-op Plan:   Post-operative Plan:   Informed Consent: I have reviewed the patients History and Physical, chart, labs and discussed the procedure including the risks, benefits and alternatives for the proposed anesthesia with the patient or authorized representative who has indicated his/her understanding and acceptance.     Dental advisory given  Plan Discussed with: CRNA  Anesthesia Plan Comments:         Anesthesia Quick Evaluation

## 2020-10-22 NOTE — Interval H&P Note (Signed)
History and Physical Interval Note:  10/22/2020 9:44 AM  [name]  has presented today for surgery, with the diagnosis of LEFT HIP OSTOEARTHRITIS.  The various methods of treatment have been discussed with the patient and family. After consideration of risks, benefits and other options for treatment, the patient has consented to  Procedure(s): LEFT TOTAL HIP ARTHROPLASTY ANTERIOR APPROACH (Left) as a surgical intervention.  The patient's history has been reviewed, patient examined, no change in status, stable for surgery.  I have reviewed the patient's chart and labs.  Questions were answered to the patient's satisfaction.     Nestor Lewandowsky

## 2020-10-22 NOTE — Anesthesia Procedure Notes (Signed)
Spinal  Patient location during procedure: OR Start time: 10/22/2020 11:00 AM End time: 10/22/2020 11:10 AM Reason for block: surgical anesthesia Staffing Performed: anesthesiologist  Anesthesiologist: Elmer Picker, MD Preanesthetic Checklist Completed: patient identified, IV checked, risks and benefits discussed, surgical consent, monitors and equipment checked, pre-op evaluation and timeout performed Spinal Block Patient position: sitting Prep: DuraPrep and site prepped and draped Patient monitoring: cardiac monitor, continuous pulse ox and blood pressure Approach: midline Location: L3-4 Injection technique: single-shot Needle Needle type: Pencan  Needle gauge: 24 G Needle length: 9 cm Assessment Sensory level: T6 Events: CSF return Additional Notes Functioning IV was confirmed and monitors were applied. Sterile prep and drape, including hand hygiene and sterile gloves were used. The patient was positioned and the spine was prepped. The skin was anesthetized with lidocaine.  Free flow of clear CSF was obtained prior to injecting local anesthetic into the CSF.  The spinal needle aspirated freely following injection.  The needle was carefully withdrawn.  The patient tolerated the procedure well.

## 2020-10-22 NOTE — Anesthesia Postprocedure Evaluation (Signed)
Anesthesia Post Note  Patient: [name]  Procedure(s) Performed: LEFT TOTAL HIP ARTHROPLASTY ANTERIOR APPROACH (Left Hip)     Patient location during evaluation: PACU Anesthesia Type: Spinal Level of consciousness: oriented and awake and alert Pain management: pain level controlled Vital Signs Assessment: post-procedure vital signs reviewed and stable Respiratory status: spontaneous breathing, respiratory function stable and patient connected to nasal cannula oxygen Cardiovascular status: blood pressure returned to baseline and stable Postop Assessment: no headache, no backache and no apparent nausea or vomiting Anesthetic complications: no   No complications documented.  Last Vitals:  Vitals:   10/22/20 1345 10/22/20 1400  BP: 121/64 (!) 121/59  Pulse: (!) 59 64  Resp: (!) 9 12  Temp:  (!) 36.3 C  SpO2: 95% 93%    Last Pain:  Vitals:   10/22/20 1400  TempSrc:   PainSc: 0-No pain                 Lelaina Oatis L Chika Cichowski

## 2020-10-23 ENCOUNTER — Encounter (HOSPITAL_COMMUNITY): Payer: Self-pay | Admitting: Orthopedic Surgery

## 2020-10-23 DIAGNOSIS — D62 Acute posthemorrhagic anemia: Secondary | ICD-10-CM | POA: Diagnosis not present

## 2020-10-23 DIAGNOSIS — F419 Anxiety disorder, unspecified: Secondary | ICD-10-CM | POA: Diagnosis present

## 2020-10-23 DIAGNOSIS — Z9181 History of falling: Secondary | ICD-10-CM | POA: Diagnosis not present

## 2020-10-23 DIAGNOSIS — E782 Mixed hyperlipidemia: Secondary | ICD-10-CM | POA: Diagnosis present

## 2020-10-23 DIAGNOSIS — E039 Hypothyroidism, unspecified: Secondary | ICD-10-CM | POA: Diagnosis present

## 2020-10-23 DIAGNOSIS — K08109 Complete loss of teeth, unspecified cause, unspecified class: Secondary | ICD-10-CM | POA: Diagnosis present

## 2020-10-23 DIAGNOSIS — Z8673 Personal history of transient ischemic attack (TIA), and cerebral infarction without residual deficits: Secondary | ICD-10-CM | POA: Diagnosis not present

## 2020-10-23 DIAGNOSIS — F0390 Unspecified dementia without behavioral disturbance: Secondary | ICD-10-CM | POA: Diagnosis present

## 2020-10-23 DIAGNOSIS — H9190 Unspecified hearing loss, unspecified ear: Secondary | ICD-10-CM | POA: Diagnosis present

## 2020-10-23 DIAGNOSIS — Z7982 Long term (current) use of aspirin: Secondary | ICD-10-CM | POA: Diagnosis not present

## 2020-10-23 DIAGNOSIS — M25752 Osteophyte, left hip: Secondary | ICD-10-CM | POA: Diagnosis present

## 2020-10-23 DIAGNOSIS — R7303 Prediabetes: Secondary | ICD-10-CM | POA: Diagnosis present

## 2020-10-23 DIAGNOSIS — Z90710 Acquired absence of both cervix and uterus: Secondary | ICD-10-CM | POA: Diagnosis not present

## 2020-10-23 DIAGNOSIS — I10 Essential (primary) hypertension: Secondary | ICD-10-CM | POA: Diagnosis present

## 2020-10-23 DIAGNOSIS — Z20822 Contact with and (suspected) exposure to covid-19: Secondary | ICD-10-CM | POA: Diagnosis present

## 2020-10-23 DIAGNOSIS — F32A Depression, unspecified: Secondary | ICD-10-CM | POA: Diagnosis present

## 2020-10-23 DIAGNOSIS — K5900 Constipation, unspecified: Secondary | ICD-10-CM | POA: Diagnosis present

## 2020-10-23 DIAGNOSIS — M1612 Unilateral primary osteoarthritis, left hip: Secondary | ICD-10-CM | POA: Diagnosis present

## 2020-10-23 DIAGNOSIS — Z79890 Hormone replacement therapy (postmenopausal): Secondary | ICD-10-CM | POA: Diagnosis not present

## 2020-10-23 DIAGNOSIS — Z79899 Other long term (current) drug therapy: Secondary | ICD-10-CM | POA: Diagnosis not present

## 2020-10-23 LAB — CBC
HCT: 30.5 % — ABNORMAL LOW (ref 36.0–46.0)
Hemoglobin: 9.6 g/dL — ABNORMAL LOW (ref 12.0–15.0)
MCH: 29.5 pg (ref 26.0–34.0)
MCHC: 31.5 g/dL (ref 30.0–36.0)
MCV: 93.8 fL (ref 80.0–100.0)
Platelets: 177 10*3/uL (ref 150–400)
RBC: 3.25 MIL/uL — ABNORMAL LOW (ref 3.87–5.11)
RDW: 13.1 % (ref 11.5–15.5)
WBC: 10.9 10*3/uL — ABNORMAL HIGH (ref 4.0–10.5)
nRBC: 0 % (ref 0.0–0.2)

## 2020-10-23 LAB — BASIC METABOLIC PANEL
Anion gap: 6 (ref 5–15)
BUN: 16 mg/dL (ref 8–23)
CO2: 25 mmol/L (ref 22–32)
Calcium: 8.5 mg/dL — ABNORMAL LOW (ref 8.9–10.3)
Chloride: 106 mmol/L (ref 98–111)
Creatinine, Ser: 0.89 mg/dL (ref 0.44–1.00)
GFR, Estimated: >60 mL/min (ref >60)
Glucose, Bld: 201 mg/dL — ABNORMAL HIGH (ref 70–99)
Potassium: 5.2 mmol/L — ABNORMAL HIGH (ref 3.5–5.1)
Sodium: 137 mmol/L (ref 135–145)

## 2020-10-23 MED ORDER — DEXAMETHASONE 4 MG PO TABS
10.0000 mg | ORAL_TABLET | Freq: Every day | ORAL | Status: DC
  Administered 2020-10-23: 10 mg via ORAL
  Filled 2020-10-23: qty 3

## 2020-10-23 NOTE — Progress Notes (Signed)
PATIENT ID: [name]  MRN: [id number]  DOB/AGE:  06/21/1936 / 85 y.o.  1 Day Post-Op Procedure(s) (LRB): LEFT TOTAL HIP ARTHROPLASTY ANTERIOR APPROACH (Left)    PROGRESS NOTE Subjective: Patient is alert, oriented, no Nausea, no Vomiting, yes passing gas, . Taking PO well. Denies SOB, Chest or Calf Pain. Using Incentive Spirometer, PAS in place. Ambulate WBAT.Unable to ambulate in PACU yesterday secondary to orthostasis Patient reports pain as  1/10. Patient unable to void overnight and had an out cath with 900 cc at 5:00 this morning according to her nurse.  Objective: Vital signs in last 24 hours: Vitals:   10/22/20 1746 10/22/20 2013 10/23/20 0143 10/23/20 0459  BP: (Abnormal) 114/56 125/69 118/69 (Abnormal) 125/51  Pulse: 72 84 77 79  Resp: 18 18 16 18   Temp: 98.6 F (37 C) 98.6 F (37 C) 97.9 F (36.6 C) 98.5 F (36.9 C)  TempSrc: Oral Oral Oral   SpO2: 95% 96% 95% 95%  Weight:      Height:          Intake/Output from previous day: I/O last 3 completed shifts: In: 3629.1 [P.O.:420; I.V.:2509.1; IV Piggyback:700] Out: 1850 [Urine:1550; Blood:300]   Intake/Output this shift: No intake/output data recorded.   LABORATORY DATA: Recent Labs    10/23/20 0330  WBC 10.9*  HGB 9.6*  HCT 30.5*  PLT 177  NA 137  K 5.2*  CL 106  CO2 25  BUN 16  CREATININE 0.89  GLUCOSE 201*  CALCIUM 8.5*    Examination: Neurologically intact ABD soft Neurovascular intact Sensation intact distally Intact pulses distally Dorsiflexion/Plantar flexion intact Incision: dressing C/D/I No cellulitis present Compartment soft} XR AP&Lat of hip shows well placed\fixed THA  Assessment:   1 Day Post-Op Procedure(s) (LRB): LEFT TOTAL HIP ARTHROPLASTY ANTERIOR APPROACH (Left) ADDITIONAL DIAGNOSIS:  Expected Acute Blood Loss Anemia, dementia, hx stroke, HTN Patient expected to be overnight observation, however her dementia and orthostasis may push 12/23/20 into an inpatient admission if  she does not do well with therapy today    Plan: PT/OT WBAT, THA,Because the patient is in very little discomfort, has dementia and was unable to void I will discontinue narcotics today  DVT Prophylaxis: SCDx72 hrs, ASA 81 mg BID x 2 weeks  DISCHARGE PLAN: Home, Today if patient passes physical therapy.  DISCHARGE NEEDS: HHPT, Walker and 3-in-1 comode seatPatient ID: [name], female   DOB: December 06, 1935, 85 y.o.   MRN: [id number]

## 2020-10-23 NOTE — Progress Notes (Signed)
Patient has not voided since arriving to the floor at approximately 6pm yesterday, no distension noted but bladder scan did show 546 ml's within the bladder, patient stated she feels like she may urinate soon and asked to have a purewick placed, on-call provider messaged through [facility] for straight cath order if she is unable to void, awaiting return call/order.

## 2020-10-23 NOTE — Progress Notes (Signed)
Physical Therapy Treatment Patient Details Name: [name] MRN: [id number] DOB: 01/29/1936 Today's Date: 10/23/2020    History of Present Illness Patient is 85 y.o. female s/p Lt THA anterior approach on 10/22/20 with PMH significant for stroke, HTN, OA, thyroid disease, depression, anxiety, and dementia.    PT Comments    Patient continues to progress well. Patient's granddaughter present and provided safe guarding during transfers and gait with RW with instruction from therapist. Pt able to ambulate ~ 180' with min assist and cues for safe walker management to avoid obstacles in hallway; pt's granddaughter able to provide appropriate cues and assist. Pt negotiated single curb with min assist and no overt LOB observed. EOS reviewed HEP handout and all questions addressed. Pt plans to begin HHPT once discharged. She is mobilizing at safe level to discharge with assist from family. Acute PT will continue to progress in hospital setting.     Follow Up Recommendations  Follow surgeon's recommendation for DC plan and follow-up therapies;Home health PT     Equipment Recommendations  None recommended by PT    Recommendations for Other Services       Precautions / Restrictions Precautions Precautions: Fall Restrictions Weight Bearing Restrictions: No LLE Weight Bearing: Weight bearing as tolerated Other Position/Activity Restrictions: WBAT    Mobility  Bed Mobility Overal bed mobility: Needs Assistance Bed Mobility: Supine to Sit     Supine to sit: HOB elevated;Min assist     General bed mobility comments: pt OOB in recliner.    Transfers Overall transfer level: Needs assistance Equipment used: Rolling walker (2 wheeled) Transfers: Sit to/from Stand Sit to Stand: Min guard         General transfer comment: cues for safe hand placement to initiate power up and to reach back to sit in recliner. Pt's granddaugher present and providing assist/guarding when pt was returning  to recliner.  Ambulation/Gait Ambulation/Gait assistance: Min guard;Min assist Gait Distance (Feet): 180 Feet Assistive device: Rolling walker (2 wheeled) Gait Pattern/deviations: Step-through pattern;Decreased step length - right;Decreased step length - left;Decreased stride length;Decreased weight shift to left Gait velocity: decr   General Gait Details: pt continues to require intermittent Min assist for use of RW to maintain safe proximity and avoid obstacles in hallway. Pt's granddaughter present and provided safe guarding and assist while pt amb in hallway.   Stairs Stairs: Yes Stairs assistance: Min assist Stair Management: No rails;Step to pattern;Forwards;With walker Number of Stairs: 1 General stair comments: VC's and min assist for safe sequencing of stepping up curb with RW, pt's granddaughter provided safe guarding and stabilized RW for pt while ascending/descending single step.   Wheelchair Mobility    Modified Rankin (Stroke Patients Only)       Balance Overall balance assessment: Needs assistance Sitting-balance support: Feet supported Sitting balance-Leahy Scale: Fair     Standing balance support: During functional activity;Bilateral upper extremity supported Standing balance-Leahy Scale: Fair Standing balance comment: pt abe to stand with hands on/off RW at EOB. cues for use of walker.                            Cognition Arousal/Alertness: Awake/alert Behavior During Therapy: WFL for tasks assessed/performed Overall Cognitive Status: History of cognitive impairments - at baseline                                 General Comments:  pt is pleasantly confused      Exercises Total Joint Exercises Ankle Circles/Pumps: Both;AROM;15 reps;Seated Quad Sets: AROM;Both;5 reps;Seated Heel Slides: AAROM;Left;Other reps (comment);Seated (3)    General Comments        Pertinent Vitals/Pain Pain Assessment: Faces Faces Pain Scale:  Hurts a little bit Pain Location: Lt hip when moving in bed Pain Descriptors / Indicators: Discomfort;Grimacing;Guarding Pain Intervention(s): Limited activity within patient's tolerance;Monitored during session;Repositioned    Home Living                      Prior Function            PT Goals (current goals can now be found in the care plan section) Acute Rehab PT Goals Patient Stated Goal: get back to independence PT Goal Formulation: With patient Time For Goal Achievement: 10/29/20 Potential to Achieve Goals: Good Progress towards PT goals: Progressing toward goals    Frequency    7X/week      PT Plan Current plan remains appropriate    Co-evaluation              AM-PAC PT "6 Clicks" Mobility   Outcome Measure  Help needed turning from your back to your side while in a flat bed without using bedrails?: None Help needed moving from lying on your back to sitting on the side of a flat bed without using bedrails?: A Little Help needed moving to and from a bed to a chair (including a wheelchair)?: A Little Help needed standing up from a chair using your arms (e.g., wheelchair or bedside chair)?: A Little Help needed to walk in hospital room?: A Little Help needed climbing 3-5 steps with a railing? : A Little 6 Click Score: 19    End of Session Equipment Utilized During Treatment: Gait belt Activity Tolerance: Treatment limited secondary to medical complications (Comment) (orthostatic hypotension) Patient left: with call bell/phone within reach;in bed;with family/visitor present Nurse Communication: Mobility status PT Visit Diagnosis: Muscle weakness (generalized) (M62.81);Difficulty in walking, not elsewhere classified (R26.2)     Time: 2992-4268 PT Time Calculation (min) (ACUTE ONLY): 28 min  Charges:  $Gait Training: 8-22 mins $Therapeutic Exercise: 8-22 mins                     Wynn Maudlin, DPT Acute Rehabilitation Services Office  [phone] Pager [phone]     Anitra Lauth 10/23/2020, 3:34 PM

## 2020-10-23 NOTE — Plan of Care (Signed)
  Problem: Pain Managment: Goal: General experience of comfort will improve Outcome: Progressing   Problem: Safety: Goal: Ability to remain free from injury will improve Outcome: Progressing   Problem: Elimination: Goal: Will not experience complications related to bowel motility Outcome: Progressing Goal: Will not experience complications related to urinary retention Outcome: Progressing   

## 2020-10-23 NOTE — Progress Notes (Signed)
Physical Therapy Treatment Patient Details Name: [name] MRN: [id number] DOB: [birth year] Today's Date: 10/23/2020    History of Present Illness Patient is 85 y.o. female s/p Lt THA anterior approach on 10/22/20 with PMH significant for stroke, HTN, OA, thyroid disease, depression, anxiety, and dementia.    PT Comments    Patient making good progress with acute PT today and initiated gait training. She continues to require min cues for sequencing and safety with transfers and gait. Min assist required for negotiation of obstacles in hallway and to maintain safe proximity to RW. EOS pt resting in recliner with alarm on and call bell. Will follow up for additional therapy session to progress gait, HEP, and stair training with family present.   Follow Up Recommendations  Follow surgeon's recommendation for DC plan and follow-up therapies;Home health PT     Equipment Recommendations  None recommended by PT    Recommendations for Other Services       Precautions / Restrictions Precautions Precautions: Fall Restrictions Weight Bearing Restrictions: No LLE Weight Bearing: Weight bearing as tolerated Other Position/Activity Restrictions: WBAT    Mobility  Bed Mobility Overal bed mobility: Needs Assistance Bed Mobility: Supine to Sit     Supine to sit: HOB elevated;Min assist     General bed mobility comments: HOb elevated and pt required min assist to sequence and bring LE's off EOB fully. Pt able to use bil UE's to scoot forward at EOB.    Transfers Overall transfer level: Needs assistance Equipment used: Rolling walker (2 wheeled) Transfers: Sit to/from Stand Sit to Stand: Min guard         General transfer comment: close guarding for safety and min cues for hand placement with RW. pt steady once standing.  Ambulation/Gait Ambulation/Gait assistance: Min assist Gait Distance (Feet): 140 Feet Assistive device: Rolling walker (2 wheeled) Gait Pattern/deviations:  Step-through pattern;Decreased step length - right;Decreased step length - left;Decreased stride length;Decreased weight shift to left Gait velocity: decr   General Gait Details: Assist required throughout to maintain safe proxmity to RW, no overt LOB noted. Pt required cues and assist to safely negotiate obstacles in hallway.   Stairs             Wheelchair Mobility    Modified Rankin (Stroke Patients Only)       Balance Overall balance assessment: Needs assistance Sitting-balance support: Feet supported Sitting balance-Leahy Scale: Fair     Standing balance support: During functional activity;Bilateral upper extremity supported Standing balance-Leahy Scale: Fair Standing balance comment: pt abe to stand with hands on/off RW at EOB. cues for use of walker.                            Cognition Arousal/Alertness: Awake/alert Behavior During Therapy: WFL for tasks assessed/performed Overall Cognitive Status: History of cognitive impairments - at baseline                                 General Comments: pt is pleasantly confused      Exercises      General Comments        Pertinent Vitals/Pain Pain Assessment: Faces Faces Pain Scale: Hurts little more Pain Location: Lt hip when moving in bed Pain Descriptors / Indicators: Discomfort;Grimacing;Guarding Pain Intervention(s): Limited activity within patient's tolerance;Monitored during session;Repositioned    Home Living  Prior Function            PT Goals (current goals can now be found in the care plan section) Acute Rehab PT Goals Patient Stated Goal: get back to independence PT Goal Formulation: With patient Time For Goal Achievement: 10/29/20 Potential to Achieve Goals: Good    Frequency    7X/week      PT Plan Current plan remains appropriate    Co-evaluation              AM-PAC PT "6 Clicks" Mobility   Outcome Measure  Help  needed turning from your back to your side while in a flat bed without using bedrails?: None Help needed moving from lying on your back to sitting on the side of a flat bed without using bedrails?: A Little Help needed moving to and from a bed to a chair (including a wheelchair)?: A Little Help needed standing up from a chair using your arms (e.g., wheelchair or bedside chair)?: A Little Help needed to walk in hospital room?: A Lot Help needed climbing 3-5 steps with a railing? : A Lot 6 Click Score: 17    End of Session Equipment Utilized During Treatment: Gait belt Activity Tolerance: Treatment limited secondary to medical complications (Comment) (orthostatic hypotension) Patient left: with call bell/phone within reach;in bed;with family/visitor present Nurse Communication: Mobility status PT Visit Diagnosis: Muscle weakness (generalized) (M62.81);Difficulty in walking, not elsewhere classified (R26.2)     Time: 1607-3710 PT Time Calculation (min) (ACUTE ONLY): 23 min  Charges:  $Gait Training: 23-37 mins                     Wynn Maudlin, DPT Acute Rehabilitation Services Office [phone] Pager [phone]     Anitra Lauth 10/23/2020, 2:09 PM

## 2020-10-23 NOTE — Discharge Summary (Signed)
Patient ID: KARIEL SKILLMAN MRN: [id number] DOB/AGE: 1936-04-14 85 y.o.  Admit date: 10/22/2020 Discharge date: 10/23/2020  Admission Diagnoses:  Active Problems:   H/O total hip arthroplasty, left   Discharge Diagnoses:  Same  Past Medical History:  Diagnosis Date  . Anxiety   . Arthritis   . Depression   . Hypertension   . Pre-diabetes   . Stroke [facility])    6 years ago  . Thyroid disease     Surgeries: Procedure(s): LEFT TOTAL HIP ARTHROPLASTY ANTERIOR APPROACH on 10/22/2020   Consultants:   Discharged Condition: Improved  Hospital Course: TEMPRANCE WYRE is an 85 y.o. female who was admitted 10/22/2020 for operative treatment of<principal problem not specified>. Patient has severe unremitting pain that affects sleep, daily activities, and work/hobbies. After pre-op clearance the patient was taken to the operating room on 10/22/2020 and underwent  Procedure(s): LEFT TOTAL HIP ARTHROPLASTY ANTERIOR APPROACH.    Patient was given perioperative antibiotics:  Anti-infectives (From admission, onward)   Start     Dose/Rate Route Frequency Ordered Stop   10/22/20 0845  ceFAZolin (ANCEF) IVPB 2g/100 mL premix        2 g 200 mL/hr over 30 Minutes Intravenous On call to O.R. 10/22/20 0865 10/22/20 1140       Patient was given sequential compression devices, early ambulation, and chemoprophylaxis to prevent DVT.  Patient benefited maximally from hospital stay and there were no complications.    Recent vital signs:  Patient Vitals for the past 24 hrs:  BP Temp Temp src Pulse Resp SpO2  10/23/20 0807 126/61 -- -- 78 -- --  10/23/20 0459 (!) 125/51 98.5 F (36.9 C) -- 79 18 95 %  10/23/20 0143 118/69 97.9 F (36.6 C) Oral 77 16 95 %  10/22/20 2013 125/69 98.6 F (37 C) Oral 84 18 96 %  10/22/20 1746 (!) 114/56 98.6 F (37 C) Oral 72 18 95 %  10/22/20 1741 (!) 114/56 98.6 F (37 C) Oral 72 18 95 %  10/22/20 1600 (!) 110/98 -- -- 72 16 97 %  10/22/20 1545 (!) 122/59 -- -- -- -- --   10/22/20 1500 (!) 124/55 -- -- 72 16 94 %  10/22/20 1421 131/68 (!) 97.4 F (36.3 C) -- 66 -- 96 %     Recent laboratory studies:  Recent Labs    10/23/20 0330  WBC 10.9*  HGB 9.6*  HCT 30.5*  PLT 177  NA 137  K 5.2*  CL 106  CO2 25  BUN 16  CREATININE 0.89  GLUCOSE 201*  CALCIUM 8.5*     Discharge Medications:   Allergies as of 10/23/2020      Reactions   Donepezil Other (See Comments)   POSSIBLE (??) Hallucinations and sleep disturbances      Medication List    STOP taking these medications   acetaminophen 500 MG tablet Commonly known as: TYLENOL     TAKE these medications   aspirin EC 81 MG tablet Take 1 tablet (81 mg total) by mouth 2 (two) times daily. What changed:   when to take this  additional instructions   gabapentin 100 MG capsule Commonly known as: Neurontin Take 1 cap in AM, 2 caps in PM What changed:   how much to take  how to take this  when to take this  additional instructions   levothyroxine 50 MCG tablet Commonly known as: SYNTHROID Take 50 mcg by mouth daily before breakfast.   lisinopril 5 MG  tablet Commonly known as: ZESTRIL Take 5 mg by mouth in the morning.   memantine 5 MG tablet Commonly known as: NAMENDA Take 1 tablet (5 mg total) by mouth daily. What changed: when to take this   metoprolol succinate 50 MG 24 hr tablet Commonly known as: TOPROL-XL Take 25 mg by mouth in the morning.   OLANZapine 10 MG tablet Commonly known as: ZYPREXA Take 1/2 tab in AM, 1 tab qhs What changed:   how much to take  how to take this  when to take this  additional instructions   oxyCODONE-acetaminophen 5-325 MG tablet Commonly known as: PERCOCET/ROXICET Take 1 tablet by mouth every 4 (four) hours as needed for severe pain.   sertraline 50 MG tablet Commonly known as: ZOLOFT Take 1 tablet (50 mg total) by mouth daily. What changed: when to take this   simvastatin 20 MG tablet Commonly known as: ZOCOR Take 20  mg by mouth at bedtime.   tiZANidine 2 MG tablet Commonly known as: ZANAFLEX Take 1 tablet (2 mg total) by mouth every 6 (six) hours as needed.   traZODone 50 MG tablet Commonly known as: DESYREL Take 2 tabs qhs What changed:   how much to take  how to take this  when to take this  additional instructions            Durable Medical Equipment  (From admission, onward)         Start     Ordered   10/22/20 1739  DME Walker rolling  Once       Question:  Patient needs a walker to treat with the following condition  Answer:  Status post total hip replacement, left   10/22/20 1738   10/22/20 1739  DME 3 n 1  Once        10/22/20 1738           Discharge Care Instructions  (From admission, onward)         Start     Ordered   10/23/20 0000  Weight bearing as tolerated        10/23/20 1404   10/22/20 0000  Weight bearing as tolerated        10/22/20 1240          Diagnostic Studies: DG Chest 2 View  Result Date: 10/17/2020 CLINICAL DATA:  Preop for total hip arthroplasty. EXAM: CHEST - 2 VIEW COMPARISON:  02/16/2020 FINDINGS: The cardiac silhouette, mediastinal and hilar contours are within normal limits. The lungs are clear of an acute process. Moderate hyperinflation. The bony thorax is intact. IMPRESSION: No acute cardiopulmonary findings. Electronically Signed   By: Rudie MeyerP.  Gallerani M.D.   On: 10/17/2020 10:42   DG C-Arm 1-60 Min-No Report  Result Date: 10/22/2020 Fluoroscopy was utilized by the requesting physician.  No radiographic interpretation.   DG HIP OPERATIVE UNILAT W OR W/O PELVIS LEFT  Result Date: 10/22/2020 CLINICAL DATA:  Left hip replacement EXAM: OPERATIVE LEFT HIP WITH PELVIS COMPARISON:  None. FLUOROSCOPY TIME:  Radiation Exposure Index (as provided by the fluoroscopic device): Not available If the device does not provide the exposure index: Fluoroscopy Time:  Not available Number of Acquired Images:  2 FINDINGS: Left hip prosthesis is noted in  place. No other bony or soft tissue abnormality is noted. IMPRESSION: Status post left hip replacement. Electronically Signed   By: Alcide CleverMark  Lukens M.D.   On: 10/22/2020 14:59    Disposition: Discharge disposition: 01-Home or Self Care  Discharge Instructions    Call MD / Call 911   Complete by: As directed    If you experience chest pain or shortness of breath, CALL 911 and be transported to the hospital emergency room.  If you develope a fever above 101 F, pus (white drainage) or increased drainage or redness at the wound, or calf pain, call your surgeon's office.   Call MD / Call 911   Complete by: As directed    If you experience chest pain or shortness of breath, CALL 911 and be transported to the hospital emergency room.  If you develope a fever above 101 F, pus (white drainage) or increased drainage or redness at the wound, or calf pain, call your surgeon's office.   Constipation Prevention   Complete by: As directed    Drink plenty of fluids.  Prune juice may be helpful.  You may use a stool softener, such as Colace (over the counter) 100 mg twice a day.  Use MiraLax (over the counter) for constipation as needed.   Constipation Prevention   Complete by: As directed    Drink plenty of fluids.  Prune juice may be helpful.  You may use a stool softener, such as Colace (over the counter) 100 mg twice a day.  Use MiraLax (over the counter) for constipation as needed.   Diet - low sodium heart healthy   Complete by: As directed    Driving restrictions   Complete by: As directed    No driving for 2 weeks   Driving restrictions   Complete by: As directed    No driving for 2 weeks   Increase activity slowly as tolerated   Complete by: As directed    Increase activity slowly as tolerated   Complete by: As directed    Patient may shower   Complete by: As directed    You may shower without a dressing once there is no drainage.  Do not wash over the wound.  If drainage remains,  cover wound with plastic wrap and then shower.   Patient may shower   Complete by: As directed    You may shower without a dressing once there is no drainage.  Do not wash over the wound.  If drainage remains, cover wound with plastic wrap and then shower.   Post-operative opioid taper instructions:   Complete by: As directed    POST-OPERATIVE OPIOID TAPER INSTRUCTIONS: It is important to wean off of your opioid medication as soon as possible. If you do not need pain medication after your surgery it is ok to stop day one. Opioids include: Codeine, Hydrocodone(Norco, Vicodin), Oxycodone(Percocet, oxycontin) and hydromorphone amongst others.  Long term and even short term use of opiods can cause: Increased pain response Dependence Constipation Depression Respiratory depression And more.  Withdrawal symptoms can include Flu like symptoms Nausea, vomiting And more Techniques to manage these symptoms Hydrate well Eat regular healthy meals Stay active Use relaxation techniques(deep breathing, meditating, yoga) Do Not substitute Alcohol to help with tapering If you have been on opioids for less than two weeks and do not have pain than it is ok to stop all together.  Plan to wean off of opioids This plan should start within one week post op of your joint replacement. Maintain the same interval or time between taking each dose and first decrease the dose.  Cut the total daily intake of opioids by one tablet each day Next start to increase the time between doses. The  last dose that should be eliminated is the evening dose.      Post-operative opioid taper instructions:   Complete by: As directed    POST-OPERATIVE OPIOID TAPER INSTRUCTIONS: It is important to wean off of your opioid medication as soon as possible. If you do not need pain medication after your surgery it is ok to stop day one. Opioids include: Codeine, Hydrocodone(Norco, Vicodin), Oxycodone(Percocet, oxycontin) and  hydromorphone amongst others.  Long term and even short term use of opiods can cause: Increased pain response Dependence Constipation Depression Respiratory depression And more.  Withdrawal symptoms can include Flu like symptoms Nausea, vomiting And more Techniques to manage these symptoms Hydrate well Eat regular healthy meals Stay active Use relaxation techniques(deep breathing, meditating, yoga) Do Not substitute Alcohol to help with tapering If you have been on opioids for less than two weeks and do not have pain than it is ok to stop all together.  Plan to wean off of opioids This plan should start within one week post op of your joint replacement. Maintain the same interval or time between taking each dose and first decrease the dose.  Cut the total daily intake of opioids by one tablet each day Next start to increase the time between doses. The last dose that should be eliminated is the evening dose.      Weight bearing as tolerated   Complete by: As directed    Weight bearing as tolerated   Complete by: As directed        Follow-up Information    Gean Birchwood, MD. Go on 11/04/2020.   Specialty: Orthopedic Surgery Why: Your appointment is scheduled for 9:15.  Contact information: [address] [place] [address] [phone]        [facility] Follow up.   Specialty: Home Health Services Why: HHPT will provide 5 home visits prior to starting outpatient physical therapy  Contact information: [address] [place] [address] [phone]        [facility] [place]. Go on 11/04/2020.   Why: Your appointment is scheduled for 10:40. Please go over to the therapy side as soon as your MD visit is complete.  Contact information: [facility] [address] [place] [address] [phone]        Gean Birchwood, MD In 2 weeks.   Specialty: Orthopedic Surgery Contact information: [address] [place] [address] [phone]                Signed: Dannielle Burn 10/23/2020, 2:05 PM

## 2020-10-23 NOTE — TOC Transition Note (Signed)
Transition of Care [facility]) - CM/SW Discharge Note  Patient Details  Name: NOHA MILBERGER MRN: [id number] Date of Birth: 1935/11/26  Transition [facility] CM/SW Contact:  Ewing Schlein, LCSW Phone Number: 10/23/2020, 10:52 AM  Clinical Narrative: Patient is expected to discharge home with HHPT after working with PT. CSW spoke with patient's granddaughter/caregiver, [name], to confirm discharge plan as patient has dementia and was unable to correctly remember what DME she already has at home. Patient will discharge home with HHPT through [facility] and then transition to OPPT through [facility]. Per granddaughter, patient lives at home with her and the patient has a rolling walker, chair lift, shower seat, and 3N1 so there are no DME needs at this time. Granddaughter to come to the hospital to be present for the second PT session. TOC signing off.  Final next level of care: Home w Home Health Services Barriers to Discharge: No Barriers Identified  Patient Goals and CMS Choice Patient states their goals for this hospitalization and ongoing recovery are:: Return home with HHPT CMS Medicare.gov Compare Post Acute Care list provided to:: Patient Represenative (must comment) Electrical engineer Nurse, learning disability))  Discharge Plan and Services        DME Arranged: N/A DME Agency: NA HH Arranged: PT HH Agency: [facility] Representative spoke with at [facility] Agency: Pre-arranged before surgery  Readmission Risk Interventions No flowsheet data found.

## 2020-10-24 DIAGNOSIS — F32A Depression, unspecified: Secondary | ICD-10-CM | POA: Diagnosis not present

## 2020-10-24 DIAGNOSIS — G4700 Insomnia, unspecified: Secondary | ICD-10-CM | POA: Diagnosis not present

## 2020-10-24 DIAGNOSIS — I10 Essential (primary) hypertension: Secondary | ICD-10-CM | POA: Diagnosis not present

## 2020-10-24 DIAGNOSIS — Z471 Aftercare following joint replacement surgery: Secondary | ICD-10-CM | POA: Diagnosis not present

## 2020-10-24 DIAGNOSIS — E039 Hypothyroidism, unspecified: Secondary | ICD-10-CM | POA: Diagnosis not present

## 2020-10-24 DIAGNOSIS — R7303 Prediabetes: Secondary | ICD-10-CM | POA: Diagnosis not present

## 2020-10-24 DIAGNOSIS — F0281 Dementia in other diseases classified elsewhere with behavioral disturbance: Secondary | ICD-10-CM | POA: Diagnosis not present

## 2020-10-24 DIAGNOSIS — F419 Anxiety disorder, unspecified: Secondary | ICD-10-CM | POA: Diagnosis not present

## 2020-10-24 DIAGNOSIS — M150 Primary generalized (osteo)arthritis: Secondary | ICD-10-CM | POA: Diagnosis not present

## 2020-10-27 ENCOUNTER — Other Ambulatory Visit (HOSPITAL_COMMUNITY): Payer: Self-pay

## 2020-10-28 DIAGNOSIS — M150 Primary generalized (osteo)arthritis: Secondary | ICD-10-CM | POA: Diagnosis not present

## 2020-10-28 DIAGNOSIS — F32A Depression, unspecified: Secondary | ICD-10-CM | POA: Diagnosis not present

## 2020-10-28 DIAGNOSIS — F419 Anxiety disorder, unspecified: Secondary | ICD-10-CM | POA: Diagnosis not present

## 2020-10-28 DIAGNOSIS — G4700 Insomnia, unspecified: Secondary | ICD-10-CM | POA: Diagnosis not present

## 2020-10-28 DIAGNOSIS — Z471 Aftercare following joint replacement surgery: Secondary | ICD-10-CM | POA: Diagnosis not present

## 2020-10-28 DIAGNOSIS — R7303 Prediabetes: Secondary | ICD-10-CM | POA: Diagnosis not present

## 2020-10-28 DIAGNOSIS — I10 Essential (primary) hypertension: Secondary | ICD-10-CM | POA: Diagnosis not present

## 2020-10-28 DIAGNOSIS — F0281 Dementia in other diseases classified elsewhere with behavioral disturbance: Secondary | ICD-10-CM | POA: Diagnosis not present

## 2020-10-28 DIAGNOSIS — E039 Hypothyroidism, unspecified: Secondary | ICD-10-CM | POA: Diagnosis not present

## 2020-10-30 DIAGNOSIS — G4700 Insomnia, unspecified: Secondary | ICD-10-CM | POA: Diagnosis not present

## 2020-10-30 DIAGNOSIS — M150 Primary generalized (osteo)arthritis: Secondary | ICD-10-CM | POA: Diagnosis not present

## 2020-10-30 DIAGNOSIS — F32A Depression, unspecified: Secondary | ICD-10-CM | POA: Diagnosis not present

## 2020-10-30 DIAGNOSIS — I10 Essential (primary) hypertension: Secondary | ICD-10-CM | POA: Diagnosis not present

## 2020-10-30 DIAGNOSIS — F419 Anxiety disorder, unspecified: Secondary | ICD-10-CM | POA: Diagnosis not present

## 2020-10-30 DIAGNOSIS — R7303 Prediabetes: Secondary | ICD-10-CM | POA: Diagnosis not present

## 2020-10-30 DIAGNOSIS — E039 Hypothyroidism, unspecified: Secondary | ICD-10-CM | POA: Diagnosis not present

## 2020-10-30 DIAGNOSIS — F0281 Dementia in other diseases classified elsewhere with behavioral disturbance: Secondary | ICD-10-CM | POA: Diagnosis not present

## 2020-10-30 DIAGNOSIS — Z471 Aftercare following joint replacement surgery: Secondary | ICD-10-CM | POA: Diagnosis not present

## 2020-11-01 DIAGNOSIS — M150 Primary generalized (osteo)arthritis: Secondary | ICD-10-CM | POA: Diagnosis not present

## 2020-11-01 DIAGNOSIS — E039 Hypothyroidism, unspecified: Secondary | ICD-10-CM | POA: Diagnosis not present

## 2020-11-01 DIAGNOSIS — Z471 Aftercare following joint replacement surgery: Secondary | ICD-10-CM | POA: Diagnosis not present

## 2020-11-01 DIAGNOSIS — F0281 Dementia in other diseases classified elsewhere with behavioral disturbance: Secondary | ICD-10-CM | POA: Diagnosis not present

## 2020-11-01 DIAGNOSIS — F32A Depression, unspecified: Secondary | ICD-10-CM | POA: Diagnosis not present

## 2020-11-01 DIAGNOSIS — I10 Essential (primary) hypertension: Secondary | ICD-10-CM | POA: Diagnosis not present

## 2020-11-01 DIAGNOSIS — R7303 Prediabetes: Secondary | ICD-10-CM | POA: Diagnosis not present

## 2020-11-01 DIAGNOSIS — F419 Anxiety disorder, unspecified: Secondary | ICD-10-CM | POA: Diagnosis not present

## 2020-11-01 DIAGNOSIS — G4700 Insomnia, unspecified: Secondary | ICD-10-CM | POA: Diagnosis not present

## 2020-11-03 DIAGNOSIS — F32A Depression, unspecified: Secondary | ICD-10-CM | POA: Diagnosis not present

## 2020-11-03 DIAGNOSIS — I10 Essential (primary) hypertension: Secondary | ICD-10-CM | POA: Diagnosis not present

## 2020-11-03 DIAGNOSIS — Z471 Aftercare following joint replacement surgery: Secondary | ICD-10-CM | POA: Diagnosis not present

## 2020-11-03 DIAGNOSIS — G4700 Insomnia, unspecified: Secondary | ICD-10-CM | POA: Diagnosis not present

## 2020-11-03 DIAGNOSIS — E039 Hypothyroidism, unspecified: Secondary | ICD-10-CM | POA: Diagnosis not present

## 2020-11-03 DIAGNOSIS — F0281 Dementia in other diseases classified elsewhere with behavioral disturbance: Secondary | ICD-10-CM | POA: Diagnosis not present

## 2020-11-03 DIAGNOSIS — M150 Primary generalized (osteo)arthritis: Secondary | ICD-10-CM | POA: Diagnosis not present

## 2020-11-03 DIAGNOSIS — F419 Anxiety disorder, unspecified: Secondary | ICD-10-CM | POA: Diagnosis not present

## 2020-11-03 DIAGNOSIS — R7303 Prediabetes: Secondary | ICD-10-CM | POA: Diagnosis not present

## 2020-11-04 DIAGNOSIS — Z96642 Presence of left artificial hip joint: Secondary | ICD-10-CM | POA: Diagnosis not present

## 2020-11-04 DIAGNOSIS — R262 Difficulty in walking, not elsewhere classified: Secondary | ICD-10-CM | POA: Diagnosis not present

## 2020-11-04 DIAGNOSIS — Z471 Aftercare following joint replacement surgery: Secondary | ICD-10-CM | POA: Diagnosis not present

## 2020-11-05 ENCOUNTER — Other Ambulatory Visit: Payer: Self-pay | Admitting: Neurology

## 2020-12-06 ENCOUNTER — Other Ambulatory Visit: Payer: Self-pay | Admitting: Neurology

## 2020-12-31 ENCOUNTER — Telehealth (INDEPENDENT_AMBULATORY_CARE_PROVIDER_SITE_OTHER): Payer: Medicare PPO | Admitting: Neurology

## 2020-12-31 ENCOUNTER — Encounter: Payer: Self-pay | Admitting: Neurology

## 2020-12-31 ENCOUNTER — Other Ambulatory Visit: Payer: Self-pay

## 2020-12-31 DIAGNOSIS — F0281 Dementia in other diseases classified elsewhere with behavioral disturbance: Secondary | ICD-10-CM | POA: Diagnosis not present

## 2020-12-31 DIAGNOSIS — G301 Alzheimer's disease with late onset: Secondary | ICD-10-CM

## 2020-12-31 DIAGNOSIS — F02818 Dementia in other diseases classified elsewhere, unspecified severity, with other behavioral disturbance: Secondary | ICD-10-CM

## 2020-12-31 NOTE — Progress Notes (Signed)
Virtual Visit via Video Note The purpose of this virtual visit is to provide medical care while limiting exposure to the novel coronavirus.    Consent was obtained for video visit:  Yes.   Answered questions that patient had about telehealth interaction:  Yes.   I discussed the limitations, risks, security and privacy concerns of performing an evaluation and management service by telemedicine. I also discussed with the patient that there may be a patient responsible charge related to this service. The patient expressed understanding and agreed to proceed.  Pt location: Home Physician Location: office Name of referring provider:  [name], MD I connected with Rhea Belton at patients initiation/request on 12/31/2020 at  3:30 PM EDT by video enabled telemedicine application and verified that I am speaking with the correct person using two identifiers. Pt MRN:  [id number] Pt DOB:  06-11-1936 Video Participants:  Rhea Belton;  [name] (granddaughter)   History of Present Illness:  The patient had a virtual video visit on 12/31/2020. She was last seen in the Neurology clinic 7 months ago for dementia with behavioral disturbance. She is again accompanied by her granddaughter [name] who helps supplement the history today. Since her last visit, she underwent hip surgery last May 2022 and recovered really well. She did not need hydrocodone and walking is great now. Sleep is good. [name] reports she is doing very, very good on her current regimen. [name] manages medications, finances. She does not drive. She is independent with dressing and bathing. No further significant behavioral issues. She denies any headaches, dizziness, vision changes, focal numbness/tingling, no falls. She is on Memantine 5mg  daily, Gabapentin 100mg  in AM, 200mg  in PM, olanzapine 10mg  1/2 tab in AM, 1 tab qhs, Sertraline 50mg  daily, and Trazodone 100mg  qhs without side effects.   History on Initial Assessment 01/01/2020:  This is a pleasant 85 year old right-handed woman with a history of hypertension, hyperlipidemia, prior stroke with no residual deficits, presenting for evaluation of auditory hallucinations and memory loss. She states her memory "comes and goes." She lives with her granddaughter and daughter-in-law . started noticing memory changes the last couple of years where she would be repeating herself. Recently, memory has worsened, she had 2 appointments in one day and did not remember the afternoon appointment. They have had to write down notes more. She manages her own medications and has not noticed any issues with forgetting medications. She used to live with her husband then he passed away and she lived alone in [place] for 9 months, before she moved in with [place] 2 years ago. She was not missing any bill payments while alone, bills were switched to autopay 2 years ago. She continues to drive around [place] and denies getting lost. She was getting lost in [place], but they feel it was due to being in an unfamiliar place. [name] started noticing auditory hallucinations while she was still living alone, she would say something was not right with the air, or the doors, so [name] moved her. She was saying the she would hear cars at night or someone trying to break in, barricading her door. She would say some boys were drinking beside her condo or she had mice in the condo and put traps out but never caught any. Since moving in with [name] 2 years ago, she continued to have hallucinations, mostly at night initially, saying something was in the bed with her. Hallucinations have progressively worsened the past few months, also  occurring in the daytime. She does not remember what she hears at night, [name] would remind her that she tells [name] she hears her granddaughter calling her "[name]" at night or during the day when the granddaughter is not home. She denies any visual hallucinations,  however [name] reminds her the other day she thought she saw someone getting in the window, she heard things outside and said they were packing things into delivery trucks. She saw arms lifting things up, loading trucks up. She saw a shadow come back the other day on the 2nd level window. She would be afraid he would get a hold of the family.  She would be up all night, knocking on their doors telling them about the hallucinations. Melatonin did not help. Family brought her to the ER a week ago, CBC, CMP, urinalysis unremarkable except for anemia. I personally reviewed head CT without contrast which did not show any acute changes, there was diffuse cerebral and cerebellar atrophy, moderately extensive chronic microvascular disease. They were instructed to give Tylenol PM to help her sleep at night, she has been taking 2 tabs qhs and has been sleeping all night with this since then. No REM behavior disorder noted. [name] denies any prior psychiatric history. No hygiene concerns, she is able to bathe and dress independently. Her mother and father had Alzheimer's disease. No history of significant head injuries. She has not had any alcohol in a while.   She denies any headaches, dizziness, diplopia, dysarthria/dysphagia, neck/back pain, focal numbness/tingling/weakness, bowel/bladder dysfunction, anosmia, or tremors. Mood comes and goes, "but not bad." Her last fall was in October 2020 when she tripped. She is scheduled for left hip replacement on 01/28/20.   Update 02/04/2020: Since her last visit, she was brought to the ER on 01/04/20 for worsening behaviors with auditory and visual hallucinations about babies crying while accusing family of killing the crying babies. She would run out of the house at all times at night. The police was in their home when she drove off without her phone. An IVC had to be issued so police could get her. She was brought to the ER where bloodwork was unremarkable. UA showed moderate  leukocytes, 6-10 WBC, culture negative. UDS negative. She had an MRI brain on 01/05/20 which did not show any acute changes. There was diffuse cerebral atrophy, moderate chronic microvascular disease. She was admitted to [facility] from 7/20 to 8/4. She was started on gabapentin 150mg  every 8 hours (250mg /54mL taking 3 mLs TID), Memantine 5mg  daily, olanzapine 10mg  qhs, Sertraline 50mg  daily, and Trazodone 50mg  qhs. Donepezil was stopped since she had significant worsening the day she started it. She has had significant improvement since hospital discharge. No further hallucinations or delusions.    Current Outpatient Medications on File Prior to Visit  Medication Sig Dispense Refill   aspirin EC 81 MG tablet Take 1 tablet (81 mg total) by mouth 2 (two) times daily. (Patient taking differently: Take 81 mg by mouth daily.) 60 tablet 0   gabapentin (NEURONTIN) 100 MG capsule Take 1 cap in AM, 2 caps in PM (Patient taking differently: Take 100-200 mg by mouth See admin instructions. Take 1 capsule (100 mg) by mouth in the morning & take 2 capsules (200 mg) by mouth at night.) 270 capsule 3   levothyroxine (SYNTHROID) 50 MCG tablet Take 50 mcg by mouth daily before breakfast.     lisinopril (ZESTRIL) 5 MG tablet Take 5 mg by mouth in the morning.  memantine (NAMENDA) 5 MG tablet Take 1 tablet (5 mg total) by mouth daily. (Patient taking differently: Take 5 mg by mouth in the morning.) 90 tablet 3   metoprolol succinate (TOPROL-XL) 50 MG 24 hr tablet Take 25 mg by mouth in the morning.     OLANZapine (ZYPREXA) 10 MG tablet Take 1/2 tab in AM, 1 tab qhs (Patient taking differently: Take 10 mg by mouth See admin instructions. Take 0.5 tablet (5 mg) by mouth in the morning if needed for confusion & take 1 tablet (10 mg) by mouth (scheduled) every night.) 135 tablet 3   sertraline (ZOLOFT) 50 MG tablet TAKE 1 TABLET EVERY DAY 60 tablet 0   simvastatin (ZOCOR) 20 MG tablet Take 20 mg by mouth  at bedtime.      traZODone (DESYREL) 50 MG tablet TAKE 2 TABLETS AT BEDTIME 60 tablet 0   oxyCODONE-acetaminophen (PERCOCET/ROXICET) 5-325 MG tablet Take 1 tablet by mouth every 4 (four) hours as needed for severe pain. (Patient not taking: Reported on 12/31/2020) 30 tablet 0   tiZANidine (ZANAFLEX) 2 MG tablet Take 1 tablet (2 mg total) by mouth every 6 (six) hours as needed. (Patient not taking: Reported on 12/31/2020) 60 tablet 0   No current facility-administered medications on file prior to visit.     Observations/Objective:   GEN:  The patient appears stated age and is in NAD.  Neurological examination: Patient is awake, alert, oriented to person, place. Stated month is June then corrected to July. No aphasia or dysarthria. Intact fluency and comprehension. Remote and recent memory impaired, 0/3 delayed recall. Cranial nerves: Extraocular movements intact with no nystagmus. No facial asymmetry. Hard of hearing. Motor: moves all extremities symmetrically, at least anti-gravity x 4. Gait: slow and cautious, no ataxia   Assessment and Plan:   This is a pleasant 85 yo RH woman with a history of hypertension, hyperlipidemia, prior stroke with no residual deficits, with likely Alzheimer's disease with behavioral changes. MRI brain no acute changes, there was diffuse cerebral atrophy, moderate chronic microvascular disease. Psychosis controlled on current regimen, continue olanzapine 10mg  1/2 tab in AM, 1 tab qhs, sertraline 50mg  daily, Trazodone 100mg  qhs, Memantine 5mg  daily, gabapentin 100mg  in AM, 200mg  qhs. Continue 24/7 care. Follow-up in 6 months, call for any changes.    Follow Up Instructions:   -I discussed the assessment and treatment plan with the patient/granddaughter. The patient/granddaughter were provided an opportunity to ask questions and all were answered. The patient/granddaughter agreed with the plan and demonstrated an understanding of the instructions.   The  patient/granddaughter were advised to call back or seek an in-person evaluation if the symptoms worsen or if the condition fails to improve as anticipated.     , MD

## 2021-01-14 ENCOUNTER — Other Ambulatory Visit: Payer: Self-pay | Admitting: Neurology

## 2021-01-20 MED ORDER — GABAPENTIN 100 MG PO CAPS: ORAL_CAPSULE | ORAL | 3 refills | Status: DC

## 2021-01-20 MED ORDER — TRAZODONE HCL 50 MG PO TABS: ORAL_TABLET | ORAL | 3 refills | Status: DC

## 2021-01-20 MED ORDER — MEMANTINE HCL 5 MG PO TABS: 5.0000 mg | ORAL_TABLET | Freq: Every morning | ORAL | 3 refills | Status: DC

## 2021-01-20 MED ORDER — SERTRALINE HCL 50 MG PO TABS: 50.0000 mg | ORAL_TABLET | Freq: Every day | ORAL | 3 refills | Status: DC

## 2021-01-20 NOTE — Patient Instructions (Signed)
Good to see you! Continue all medications. Follow-up in 6 months, call for any changes.

## 2021-02-12 ENCOUNTER — Other Ambulatory Visit: Payer: Self-pay | Admitting: Neurology

## 2021-03-11 DIAGNOSIS — S81802A Unspecified open wound, left lower leg, initial encounter: Secondary | ICD-10-CM | POA: Diagnosis not present

## 2021-03-13 DIAGNOSIS — E039 Hypothyroidism, unspecified: Secondary | ICD-10-CM | POA: Diagnosis not present

## 2021-03-13 DIAGNOSIS — F0280 Dementia in other diseases classified elsewhere without behavioral disturbance: Secondary | ICD-10-CM | POA: Diagnosis not present

## 2021-03-13 DIAGNOSIS — F32A Depression, unspecified: Secondary | ICD-10-CM | POA: Diagnosis not present

## 2021-03-13 DIAGNOSIS — L89312 Pressure ulcer of right buttock, stage 2: Secondary | ICD-10-CM | POA: Diagnosis not present

## 2021-03-13 DIAGNOSIS — F419 Anxiety disorder, unspecified: Secondary | ICD-10-CM | POA: Diagnosis not present

## 2021-03-13 DIAGNOSIS — I10 Essential (primary) hypertension: Secondary | ICD-10-CM | POA: Diagnosis not present

## 2021-03-13 DIAGNOSIS — I872 Venous insufficiency (chronic) (peripheral): Secondary | ICD-10-CM | POA: Diagnosis not present

## 2021-03-13 DIAGNOSIS — G4700 Insomnia, unspecified: Secondary | ICD-10-CM | POA: Diagnosis not present

## 2021-03-13 DIAGNOSIS — L97822 Non-pressure chronic ulcer of other part of left lower leg with fat layer exposed: Secondary | ICD-10-CM | POA: Diagnosis not present

## 2021-03-17 DIAGNOSIS — L97822 Non-pressure chronic ulcer of other part of left lower leg with fat layer exposed: Secondary | ICD-10-CM | POA: Diagnosis not present

## 2021-03-17 DIAGNOSIS — L89312 Pressure ulcer of right buttock, stage 2: Secondary | ICD-10-CM | POA: Diagnosis not present

## 2021-03-17 DIAGNOSIS — F32A Depression, unspecified: Secondary | ICD-10-CM | POA: Diagnosis not present

## 2021-03-17 DIAGNOSIS — I10 Essential (primary) hypertension: Secondary | ICD-10-CM | POA: Diagnosis not present

## 2021-03-17 DIAGNOSIS — I872 Venous insufficiency (chronic) (peripheral): Secondary | ICD-10-CM | POA: Diagnosis not present

## 2021-03-17 DIAGNOSIS — F0280 Dementia in other diseases classified elsewhere without behavioral disturbance: Secondary | ICD-10-CM | POA: Diagnosis not present

## 2021-03-17 DIAGNOSIS — E039 Hypothyroidism, unspecified: Secondary | ICD-10-CM | POA: Diagnosis not present

## 2021-03-17 DIAGNOSIS — F419 Anxiety disorder, unspecified: Secondary | ICD-10-CM | POA: Diagnosis not present

## 2021-03-17 DIAGNOSIS — G4700 Insomnia, unspecified: Secondary | ICD-10-CM | POA: Diagnosis not present

## 2021-03-20 DIAGNOSIS — I10 Essential (primary) hypertension: Secondary | ICD-10-CM | POA: Diagnosis not present

## 2021-03-20 DIAGNOSIS — L97822 Non-pressure chronic ulcer of other part of left lower leg with fat layer exposed: Secondary | ICD-10-CM | POA: Diagnosis not present

## 2021-03-20 DIAGNOSIS — E039 Hypothyroidism, unspecified: Secondary | ICD-10-CM | POA: Diagnosis not present

## 2021-03-20 DIAGNOSIS — G4700 Insomnia, unspecified: Secondary | ICD-10-CM | POA: Diagnosis not present

## 2021-03-20 DIAGNOSIS — L89312 Pressure ulcer of right buttock, stage 2: Secondary | ICD-10-CM | POA: Diagnosis not present

## 2021-03-20 DIAGNOSIS — F32A Depression, unspecified: Secondary | ICD-10-CM | POA: Diagnosis not present

## 2021-03-20 DIAGNOSIS — I872 Venous insufficiency (chronic) (peripheral): Secondary | ICD-10-CM | POA: Diagnosis not present

## 2021-03-20 DIAGNOSIS — F0280 Dementia in other diseases classified elsewhere without behavioral disturbance: Secondary | ICD-10-CM | POA: Diagnosis not present

## 2021-03-20 DIAGNOSIS — F419 Anxiety disorder, unspecified: Secondary | ICD-10-CM | POA: Diagnosis not present

## 2021-03-23 DIAGNOSIS — I10 Essential (primary) hypertension: Secondary | ICD-10-CM | POA: Diagnosis not present

## 2021-03-23 DIAGNOSIS — F32A Depression, unspecified: Secondary | ICD-10-CM | POA: Diagnosis not present

## 2021-03-23 DIAGNOSIS — L97822 Non-pressure chronic ulcer of other part of left lower leg with fat layer exposed: Secondary | ICD-10-CM | POA: Diagnosis not present

## 2021-03-23 DIAGNOSIS — L89312 Pressure ulcer of right buttock, stage 2: Secondary | ICD-10-CM | POA: Diagnosis not present

## 2021-03-23 DIAGNOSIS — I872 Venous insufficiency (chronic) (peripheral): Secondary | ICD-10-CM | POA: Diagnosis not present

## 2021-03-23 DIAGNOSIS — F0280 Dementia in other diseases classified elsewhere without behavioral disturbance: Secondary | ICD-10-CM | POA: Diagnosis not present

## 2021-03-23 DIAGNOSIS — G4700 Insomnia, unspecified: Secondary | ICD-10-CM | POA: Diagnosis not present

## 2021-03-23 DIAGNOSIS — F419 Anxiety disorder, unspecified: Secondary | ICD-10-CM | POA: Diagnosis not present

## 2021-03-23 DIAGNOSIS — E039 Hypothyroidism, unspecified: Secondary | ICD-10-CM | POA: Diagnosis not present

## 2021-03-30 DIAGNOSIS — F419 Anxiety disorder, unspecified: Secondary | ICD-10-CM | POA: Diagnosis not present

## 2021-03-30 DIAGNOSIS — I872 Venous insufficiency (chronic) (peripheral): Secondary | ICD-10-CM | POA: Diagnosis not present

## 2021-03-30 DIAGNOSIS — L97822 Non-pressure chronic ulcer of other part of left lower leg with fat layer exposed: Secondary | ICD-10-CM | POA: Diagnosis not present

## 2021-03-30 DIAGNOSIS — F32A Depression, unspecified: Secondary | ICD-10-CM | POA: Diagnosis not present

## 2021-03-30 DIAGNOSIS — F0280 Dementia in other diseases classified elsewhere without behavioral disturbance: Secondary | ICD-10-CM | POA: Diagnosis not present

## 2021-03-30 DIAGNOSIS — G4700 Insomnia, unspecified: Secondary | ICD-10-CM | POA: Diagnosis not present

## 2021-03-30 DIAGNOSIS — I10 Essential (primary) hypertension: Secondary | ICD-10-CM | POA: Diagnosis not present

## 2021-03-30 DIAGNOSIS — E039 Hypothyroidism, unspecified: Secondary | ICD-10-CM | POA: Diagnosis not present

## 2021-03-30 DIAGNOSIS — L89312 Pressure ulcer of right buttock, stage 2: Secondary | ICD-10-CM | POA: Diagnosis not present

## 2021-04-03 DIAGNOSIS — I872 Venous insufficiency (chronic) (peripheral): Secondary | ICD-10-CM | POA: Diagnosis not present

## 2021-04-03 DIAGNOSIS — L97822 Non-pressure chronic ulcer of other part of left lower leg with fat layer exposed: Secondary | ICD-10-CM | POA: Diagnosis not present

## 2021-04-03 DIAGNOSIS — G4700 Insomnia, unspecified: Secondary | ICD-10-CM | POA: Diagnosis not present

## 2021-04-03 DIAGNOSIS — F0280 Dementia in other diseases classified elsewhere without behavioral disturbance: Secondary | ICD-10-CM | POA: Diagnosis not present

## 2021-04-03 DIAGNOSIS — E039 Hypothyroidism, unspecified: Secondary | ICD-10-CM | POA: Diagnosis not present

## 2021-04-03 DIAGNOSIS — F32A Depression, unspecified: Secondary | ICD-10-CM | POA: Diagnosis not present

## 2021-04-03 DIAGNOSIS — F419 Anxiety disorder, unspecified: Secondary | ICD-10-CM | POA: Diagnosis not present

## 2021-04-03 DIAGNOSIS — I10 Essential (primary) hypertension: Secondary | ICD-10-CM | POA: Diagnosis not present

## 2021-04-03 DIAGNOSIS — L89312 Pressure ulcer of right buttock, stage 2: Secondary | ICD-10-CM | POA: Diagnosis not present

## 2021-04-07 DIAGNOSIS — L97822 Non-pressure chronic ulcer of other part of left lower leg with fat layer exposed: Secondary | ICD-10-CM | POA: Diagnosis not present

## 2021-04-07 DIAGNOSIS — F419 Anxiety disorder, unspecified: Secondary | ICD-10-CM | POA: Diagnosis not present

## 2021-04-07 DIAGNOSIS — I10 Essential (primary) hypertension: Secondary | ICD-10-CM | POA: Diagnosis not present

## 2021-04-07 DIAGNOSIS — L89312 Pressure ulcer of right buttock, stage 2: Secondary | ICD-10-CM | POA: Diagnosis not present

## 2021-04-07 DIAGNOSIS — E039 Hypothyroidism, unspecified: Secondary | ICD-10-CM | POA: Diagnosis not present

## 2021-04-07 DIAGNOSIS — G4700 Insomnia, unspecified: Secondary | ICD-10-CM | POA: Diagnosis not present

## 2021-04-07 DIAGNOSIS — F32A Depression, unspecified: Secondary | ICD-10-CM | POA: Diagnosis not present

## 2021-04-07 DIAGNOSIS — F0280 Dementia in other diseases classified elsewhere without behavioral disturbance: Secondary | ICD-10-CM | POA: Diagnosis not present

## 2021-04-07 DIAGNOSIS — I872 Venous insufficiency (chronic) (peripheral): Secondary | ICD-10-CM | POA: Diagnosis not present

## 2021-04-10 DIAGNOSIS — F32A Depression, unspecified: Secondary | ICD-10-CM | POA: Diagnosis not present

## 2021-04-10 DIAGNOSIS — F0280 Dementia in other diseases classified elsewhere without behavioral disturbance: Secondary | ICD-10-CM | POA: Diagnosis not present

## 2021-04-10 DIAGNOSIS — F419 Anxiety disorder, unspecified: Secondary | ICD-10-CM | POA: Diagnosis not present

## 2021-04-10 DIAGNOSIS — I872 Venous insufficiency (chronic) (peripheral): Secondary | ICD-10-CM | POA: Diagnosis not present

## 2021-04-10 DIAGNOSIS — I10 Essential (primary) hypertension: Secondary | ICD-10-CM | POA: Diagnosis not present

## 2021-04-10 DIAGNOSIS — L89312 Pressure ulcer of right buttock, stage 2: Secondary | ICD-10-CM | POA: Diagnosis not present

## 2021-04-10 DIAGNOSIS — L97822 Non-pressure chronic ulcer of other part of left lower leg with fat layer exposed: Secondary | ICD-10-CM | POA: Diagnosis not present

## 2021-04-10 DIAGNOSIS — G4700 Insomnia, unspecified: Secondary | ICD-10-CM | POA: Diagnosis not present

## 2021-04-10 DIAGNOSIS — E039 Hypothyroidism, unspecified: Secondary | ICD-10-CM | POA: Diagnosis not present

## 2021-04-12 DIAGNOSIS — I10 Essential (primary) hypertension: Secondary | ICD-10-CM | POA: Diagnosis not present

## 2021-04-12 DIAGNOSIS — E039 Hypothyroidism, unspecified: Secondary | ICD-10-CM | POA: Diagnosis not present

## 2021-04-12 DIAGNOSIS — L89312 Pressure ulcer of right buttock, stage 2: Secondary | ICD-10-CM | POA: Diagnosis not present

## 2021-04-12 DIAGNOSIS — F419 Anxiety disorder, unspecified: Secondary | ICD-10-CM | POA: Diagnosis not present

## 2021-04-12 DIAGNOSIS — G4700 Insomnia, unspecified: Secondary | ICD-10-CM | POA: Diagnosis not present

## 2021-04-12 DIAGNOSIS — I872 Venous insufficiency (chronic) (peripheral): Secondary | ICD-10-CM | POA: Diagnosis not present

## 2021-04-12 DIAGNOSIS — L97822 Non-pressure chronic ulcer of other part of left lower leg with fat layer exposed: Secondary | ICD-10-CM | POA: Diagnosis not present

## 2021-04-12 DIAGNOSIS — F32A Depression, unspecified: Secondary | ICD-10-CM | POA: Diagnosis not present

## 2021-04-12 DIAGNOSIS — F0280 Dementia in other diseases classified elsewhere without behavioral disturbance: Secondary | ICD-10-CM | POA: Diagnosis not present

## 2021-04-14 DIAGNOSIS — F32A Depression, unspecified: Secondary | ICD-10-CM | POA: Diagnosis not present

## 2021-04-14 DIAGNOSIS — E039 Hypothyroidism, unspecified: Secondary | ICD-10-CM | POA: Diagnosis not present

## 2021-04-14 DIAGNOSIS — I872 Venous insufficiency (chronic) (peripheral): Secondary | ICD-10-CM | POA: Diagnosis not present

## 2021-04-14 DIAGNOSIS — L97822 Non-pressure chronic ulcer of other part of left lower leg with fat layer exposed: Secondary | ICD-10-CM | POA: Diagnosis not present

## 2021-04-14 DIAGNOSIS — F419 Anxiety disorder, unspecified: Secondary | ICD-10-CM | POA: Diagnosis not present

## 2021-04-14 DIAGNOSIS — G4700 Insomnia, unspecified: Secondary | ICD-10-CM | POA: Diagnosis not present

## 2021-04-14 DIAGNOSIS — L89312 Pressure ulcer of right buttock, stage 2: Secondary | ICD-10-CM | POA: Diagnosis not present

## 2021-04-14 DIAGNOSIS — F0280 Dementia in other diseases classified elsewhere without behavioral disturbance: Secondary | ICD-10-CM | POA: Diagnosis not present

## 2021-04-14 DIAGNOSIS — I10 Essential (primary) hypertension: Secondary | ICD-10-CM | POA: Diagnosis not present

## 2021-04-21 DIAGNOSIS — L97822 Non-pressure chronic ulcer of other part of left lower leg with fat layer exposed: Secondary | ICD-10-CM | POA: Diagnosis not present

## 2021-04-21 DIAGNOSIS — G4700 Insomnia, unspecified: Secondary | ICD-10-CM | POA: Diagnosis not present

## 2021-04-21 DIAGNOSIS — F32A Depression, unspecified: Secondary | ICD-10-CM | POA: Diagnosis not present

## 2021-04-21 DIAGNOSIS — F419 Anxiety disorder, unspecified: Secondary | ICD-10-CM | POA: Diagnosis not present

## 2021-04-21 DIAGNOSIS — I872 Venous insufficiency (chronic) (peripheral): Secondary | ICD-10-CM | POA: Diagnosis not present

## 2021-04-21 DIAGNOSIS — I10 Essential (primary) hypertension: Secondary | ICD-10-CM | POA: Diagnosis not present

## 2021-04-21 DIAGNOSIS — L89312 Pressure ulcer of right buttock, stage 2: Secondary | ICD-10-CM | POA: Diagnosis not present

## 2021-04-21 DIAGNOSIS — F0280 Dementia in other diseases classified elsewhere without behavioral disturbance: Secondary | ICD-10-CM | POA: Diagnosis not present

## 2021-04-21 DIAGNOSIS — E039 Hypothyroidism, unspecified: Secondary | ICD-10-CM | POA: Diagnosis not present

## 2021-04-24 DIAGNOSIS — I872 Venous insufficiency (chronic) (peripheral): Secondary | ICD-10-CM | POA: Diagnosis not present

## 2021-04-24 DIAGNOSIS — F419 Anxiety disorder, unspecified: Secondary | ICD-10-CM | POA: Diagnosis not present

## 2021-04-24 DIAGNOSIS — F32A Depression, unspecified: Secondary | ICD-10-CM | POA: Diagnosis not present

## 2021-04-24 DIAGNOSIS — L97822 Non-pressure chronic ulcer of other part of left lower leg with fat layer exposed: Secondary | ICD-10-CM | POA: Diagnosis not present

## 2021-04-24 DIAGNOSIS — G4700 Insomnia, unspecified: Secondary | ICD-10-CM | POA: Diagnosis not present

## 2021-04-24 DIAGNOSIS — F0280 Dementia in other diseases classified elsewhere without behavioral disturbance: Secondary | ICD-10-CM | POA: Diagnosis not present

## 2021-04-24 DIAGNOSIS — I10 Essential (primary) hypertension: Secondary | ICD-10-CM | POA: Diagnosis not present

## 2021-04-24 DIAGNOSIS — L89312 Pressure ulcer of right buttock, stage 2: Secondary | ICD-10-CM | POA: Diagnosis not present

## 2021-04-24 DIAGNOSIS — E039 Hypothyroidism, unspecified: Secondary | ICD-10-CM | POA: Diagnosis not present

## 2021-04-30 DIAGNOSIS — I872 Venous insufficiency (chronic) (peripheral): Secondary | ICD-10-CM | POA: Diagnosis not present

## 2021-04-30 DIAGNOSIS — E039 Hypothyroidism, unspecified: Secondary | ICD-10-CM | POA: Diagnosis not present

## 2021-04-30 DIAGNOSIS — F419 Anxiety disorder, unspecified: Secondary | ICD-10-CM | POA: Diagnosis not present

## 2021-04-30 DIAGNOSIS — F0280 Dementia in other diseases classified elsewhere without behavioral disturbance: Secondary | ICD-10-CM | POA: Diagnosis not present

## 2021-04-30 DIAGNOSIS — L97822 Non-pressure chronic ulcer of other part of left lower leg with fat layer exposed: Secondary | ICD-10-CM | POA: Diagnosis not present

## 2021-04-30 DIAGNOSIS — G4700 Insomnia, unspecified: Secondary | ICD-10-CM | POA: Diagnosis not present

## 2021-04-30 DIAGNOSIS — L89312 Pressure ulcer of right buttock, stage 2: Secondary | ICD-10-CM | POA: Diagnosis not present

## 2021-04-30 DIAGNOSIS — I10 Essential (primary) hypertension: Secondary | ICD-10-CM | POA: Diagnosis not present

## 2021-04-30 DIAGNOSIS — F32A Depression, unspecified: Secondary | ICD-10-CM | POA: Diagnosis not present

## 2021-05-02 DIAGNOSIS — I10 Essential (primary) hypertension: Secondary | ICD-10-CM | POA: Diagnosis not present

## 2021-05-02 DIAGNOSIS — E039 Hypothyroidism, unspecified: Secondary | ICD-10-CM | POA: Diagnosis not present

## 2021-05-02 DIAGNOSIS — F419 Anxiety disorder, unspecified: Secondary | ICD-10-CM | POA: Diagnosis not present

## 2021-05-02 DIAGNOSIS — L97822 Non-pressure chronic ulcer of other part of left lower leg with fat layer exposed: Secondary | ICD-10-CM | POA: Diagnosis not present

## 2021-05-02 DIAGNOSIS — I872 Venous insufficiency (chronic) (peripheral): Secondary | ICD-10-CM | POA: Diagnosis not present

## 2021-05-02 DIAGNOSIS — F32A Depression, unspecified: Secondary | ICD-10-CM | POA: Diagnosis not present

## 2021-05-02 DIAGNOSIS — G4700 Insomnia, unspecified: Secondary | ICD-10-CM | POA: Diagnosis not present

## 2021-05-02 DIAGNOSIS — L89312 Pressure ulcer of right buttock, stage 2: Secondary | ICD-10-CM | POA: Diagnosis not present

## 2021-05-02 DIAGNOSIS — F0280 Dementia in other diseases classified elsewhere without behavioral disturbance: Secondary | ICD-10-CM | POA: Diagnosis not present

## 2021-05-04 DIAGNOSIS — L89312 Pressure ulcer of right buttock, stage 2: Secondary | ICD-10-CM | POA: Diagnosis not present

## 2021-05-04 DIAGNOSIS — G4700 Insomnia, unspecified: Secondary | ICD-10-CM | POA: Diagnosis not present

## 2021-05-04 DIAGNOSIS — F32A Depression, unspecified: Secondary | ICD-10-CM | POA: Diagnosis not present

## 2021-05-04 DIAGNOSIS — L97822 Non-pressure chronic ulcer of other part of left lower leg with fat layer exposed: Secondary | ICD-10-CM | POA: Diagnosis not present

## 2021-05-04 DIAGNOSIS — E039 Hypothyroidism, unspecified: Secondary | ICD-10-CM | POA: Diagnosis not present

## 2021-05-04 DIAGNOSIS — I872 Venous insufficiency (chronic) (peripheral): Secondary | ICD-10-CM | POA: Diagnosis not present

## 2021-05-04 DIAGNOSIS — I10 Essential (primary) hypertension: Secondary | ICD-10-CM | POA: Diagnosis not present

## 2021-05-04 DIAGNOSIS — F419 Anxiety disorder, unspecified: Secondary | ICD-10-CM | POA: Diagnosis not present

## 2021-05-04 DIAGNOSIS — F0280 Dementia in other diseases classified elsewhere without behavioral disturbance: Secondary | ICD-10-CM | POA: Diagnosis not present

## 2021-05-08 DIAGNOSIS — G4700 Insomnia, unspecified: Secondary | ICD-10-CM | POA: Diagnosis not present

## 2021-05-08 DIAGNOSIS — F0280 Dementia in other diseases classified elsewhere without behavioral disturbance: Secondary | ICD-10-CM | POA: Diagnosis not present

## 2021-05-08 DIAGNOSIS — F32A Depression, unspecified: Secondary | ICD-10-CM | POA: Diagnosis not present

## 2021-05-08 DIAGNOSIS — L97822 Non-pressure chronic ulcer of other part of left lower leg with fat layer exposed: Secondary | ICD-10-CM | POA: Diagnosis not present

## 2021-05-08 DIAGNOSIS — F419 Anxiety disorder, unspecified: Secondary | ICD-10-CM | POA: Diagnosis not present

## 2021-05-08 DIAGNOSIS — I872 Venous insufficiency (chronic) (peripheral): Secondary | ICD-10-CM | POA: Diagnosis not present

## 2021-05-08 DIAGNOSIS — I10 Essential (primary) hypertension: Secondary | ICD-10-CM | POA: Diagnosis not present

## 2021-05-08 DIAGNOSIS — E039 Hypothyroidism, unspecified: Secondary | ICD-10-CM | POA: Diagnosis not present

## 2021-05-08 DIAGNOSIS — L89312 Pressure ulcer of right buttock, stage 2: Secondary | ICD-10-CM | POA: Diagnosis not present

## 2021-05-12 DIAGNOSIS — E7800 Pure hypercholesterolemia, unspecified: Secondary | ICD-10-CM | POA: Diagnosis not present

## 2021-05-12 DIAGNOSIS — L89312 Pressure ulcer of right buttock, stage 2: Secondary | ICD-10-CM | POA: Diagnosis not present

## 2021-05-12 DIAGNOSIS — F0280 Dementia in other diseases classified elsewhere without behavioral disturbance: Secondary | ICD-10-CM | POA: Diagnosis not present

## 2021-05-12 DIAGNOSIS — G4700 Insomnia, unspecified: Secondary | ICD-10-CM | POA: Diagnosis not present

## 2021-05-12 DIAGNOSIS — F419 Anxiety disorder, unspecified: Secondary | ICD-10-CM | POA: Diagnosis not present

## 2021-05-12 DIAGNOSIS — L97822 Non-pressure chronic ulcer of other part of left lower leg with fat layer exposed: Secondary | ICD-10-CM | POA: Diagnosis not present

## 2021-05-12 DIAGNOSIS — I872 Venous insufficiency (chronic) (peripheral): Secondary | ICD-10-CM | POA: Diagnosis not present

## 2021-05-12 DIAGNOSIS — R7303 Prediabetes: Secondary | ICD-10-CM | POA: Diagnosis not present

## 2021-05-12 DIAGNOSIS — E039 Hypothyroidism, unspecified: Secondary | ICD-10-CM | POA: Diagnosis not present

## 2021-05-12 DIAGNOSIS — I10 Essential (primary) hypertension: Secondary | ICD-10-CM | POA: Diagnosis not present

## 2021-05-12 DIAGNOSIS — F32A Depression, unspecified: Secondary | ICD-10-CM | POA: Diagnosis not present

## 2021-05-15 DIAGNOSIS — F32A Depression, unspecified: Secondary | ICD-10-CM | POA: Diagnosis not present

## 2021-05-15 DIAGNOSIS — I10 Essential (primary) hypertension: Secondary | ICD-10-CM | POA: Diagnosis not present

## 2021-05-15 DIAGNOSIS — F419 Anxiety disorder, unspecified: Secondary | ICD-10-CM | POA: Diagnosis not present

## 2021-05-15 DIAGNOSIS — G4700 Insomnia, unspecified: Secondary | ICD-10-CM | POA: Diagnosis not present

## 2021-05-15 DIAGNOSIS — E039 Hypothyroidism, unspecified: Secondary | ICD-10-CM | POA: Diagnosis not present

## 2021-05-15 DIAGNOSIS — F0280 Dementia in other diseases classified elsewhere without behavioral disturbance: Secondary | ICD-10-CM | POA: Diagnosis not present

## 2021-05-15 DIAGNOSIS — L97822 Non-pressure chronic ulcer of other part of left lower leg with fat layer exposed: Secondary | ICD-10-CM | POA: Diagnosis not present

## 2021-05-15 DIAGNOSIS — I872 Venous insufficiency (chronic) (peripheral): Secondary | ICD-10-CM | POA: Diagnosis not present

## 2021-05-15 DIAGNOSIS — L89312 Pressure ulcer of right buttock, stage 2: Secondary | ICD-10-CM | POA: Diagnosis not present

## 2021-05-19 DIAGNOSIS — L97822 Non-pressure chronic ulcer of other part of left lower leg with fat layer exposed: Secondary | ICD-10-CM | POA: Diagnosis not present

## 2021-05-19 DIAGNOSIS — F32A Depression, unspecified: Secondary | ICD-10-CM | POA: Diagnosis not present

## 2021-05-19 DIAGNOSIS — F419 Anxiety disorder, unspecified: Secondary | ICD-10-CM | POA: Diagnosis not present

## 2021-05-19 DIAGNOSIS — F0280 Dementia in other diseases classified elsewhere without behavioral disturbance: Secondary | ICD-10-CM | POA: Diagnosis not present

## 2021-05-19 DIAGNOSIS — I872 Venous insufficiency (chronic) (peripheral): Secondary | ICD-10-CM | POA: Diagnosis not present

## 2021-05-19 DIAGNOSIS — I10 Essential (primary) hypertension: Secondary | ICD-10-CM | POA: Diagnosis not present

## 2021-05-19 DIAGNOSIS — E039 Hypothyroidism, unspecified: Secondary | ICD-10-CM | POA: Diagnosis not present

## 2021-05-19 DIAGNOSIS — G4700 Insomnia, unspecified: Secondary | ICD-10-CM | POA: Diagnosis not present

## 2021-05-19 DIAGNOSIS — L89312 Pressure ulcer of right buttock, stage 2: Secondary | ICD-10-CM | POA: Diagnosis not present

## 2021-05-20 DIAGNOSIS — E7800 Pure hypercholesterolemia, unspecified: Secondary | ICD-10-CM | POA: Diagnosis not present

## 2021-05-20 DIAGNOSIS — F0390 Unspecified dementia without behavioral disturbance: Secondary | ICD-10-CM | POA: Diagnosis not present

## 2021-05-20 DIAGNOSIS — I10 Essential (primary) hypertension: Secondary | ICD-10-CM | POA: Diagnosis not present

## 2021-05-20 DIAGNOSIS — Z0000 Encounter for general adult medical examination without abnormal findings: Secondary | ICD-10-CM | POA: Diagnosis not present

## 2021-05-20 DIAGNOSIS — D649 Anemia, unspecified: Secondary | ICD-10-CM | POA: Diagnosis not present

## 2021-05-20 DIAGNOSIS — E2839 Other primary ovarian failure: Secondary | ICD-10-CM | POA: Diagnosis not present

## 2021-05-20 DIAGNOSIS — E538 Deficiency of other specified B group vitamins: Secondary | ICD-10-CM | POA: Diagnosis not present

## 2021-05-20 DIAGNOSIS — R7303 Prediabetes: Secondary | ICD-10-CM | POA: Diagnosis not present

## 2021-05-20 DIAGNOSIS — E559 Vitamin D deficiency, unspecified: Secondary | ICD-10-CM | POA: Diagnosis not present

## 2021-05-20 DIAGNOSIS — E039 Hypothyroidism, unspecified: Secondary | ICD-10-CM | POA: Diagnosis not present

## 2021-05-22 DIAGNOSIS — I10 Essential (primary) hypertension: Secondary | ICD-10-CM | POA: Diagnosis not present

## 2021-05-22 DIAGNOSIS — L97822 Non-pressure chronic ulcer of other part of left lower leg with fat layer exposed: Secondary | ICD-10-CM | POA: Diagnosis not present

## 2021-05-22 DIAGNOSIS — F32A Depression, unspecified: Secondary | ICD-10-CM | POA: Diagnosis not present

## 2021-05-22 DIAGNOSIS — G4700 Insomnia, unspecified: Secondary | ICD-10-CM | POA: Diagnosis not present

## 2021-05-22 DIAGNOSIS — I872 Venous insufficiency (chronic) (peripheral): Secondary | ICD-10-CM | POA: Diagnosis not present

## 2021-05-22 DIAGNOSIS — F0280 Dementia in other diseases classified elsewhere without behavioral disturbance: Secondary | ICD-10-CM | POA: Diagnosis not present

## 2021-05-22 DIAGNOSIS — F419 Anxiety disorder, unspecified: Secondary | ICD-10-CM | POA: Diagnosis not present

## 2021-05-22 DIAGNOSIS — L89312 Pressure ulcer of right buttock, stage 2: Secondary | ICD-10-CM | POA: Diagnosis not present

## 2021-05-22 DIAGNOSIS — E039 Hypothyroidism, unspecified: Secondary | ICD-10-CM | POA: Diagnosis not present

## 2021-05-25 ENCOUNTER — Other Ambulatory Visit: Payer: Self-pay | Admitting: Family Medicine

## 2021-05-25 DIAGNOSIS — E2839 Other primary ovarian failure: Secondary | ICD-10-CM

## 2021-05-26 DIAGNOSIS — L97822 Non-pressure chronic ulcer of other part of left lower leg with fat layer exposed: Secondary | ICD-10-CM | POA: Diagnosis not present

## 2021-05-26 DIAGNOSIS — F0280 Dementia in other diseases classified elsewhere without behavioral disturbance: Secondary | ICD-10-CM | POA: Diagnosis not present

## 2021-05-26 DIAGNOSIS — I872 Venous insufficiency (chronic) (peripheral): Secondary | ICD-10-CM | POA: Diagnosis not present

## 2021-05-26 DIAGNOSIS — F32A Depression, unspecified: Secondary | ICD-10-CM | POA: Diagnosis not present

## 2021-05-26 DIAGNOSIS — I10 Essential (primary) hypertension: Secondary | ICD-10-CM | POA: Diagnosis not present

## 2021-05-26 DIAGNOSIS — E039 Hypothyroidism, unspecified: Secondary | ICD-10-CM | POA: Diagnosis not present

## 2021-05-26 DIAGNOSIS — F419 Anxiety disorder, unspecified: Secondary | ICD-10-CM | POA: Diagnosis not present

## 2021-05-26 DIAGNOSIS — G4700 Insomnia, unspecified: Secondary | ICD-10-CM | POA: Diagnosis not present

## 2021-05-26 DIAGNOSIS — L89312 Pressure ulcer of right buttock, stage 2: Secondary | ICD-10-CM | POA: Diagnosis not present

## 2021-05-29 DIAGNOSIS — L97822 Non-pressure chronic ulcer of other part of left lower leg with fat layer exposed: Secondary | ICD-10-CM | POA: Diagnosis not present

## 2021-05-29 DIAGNOSIS — G4700 Insomnia, unspecified: Secondary | ICD-10-CM | POA: Diagnosis not present

## 2021-05-29 DIAGNOSIS — L89312 Pressure ulcer of right buttock, stage 2: Secondary | ICD-10-CM | POA: Diagnosis not present

## 2021-05-29 DIAGNOSIS — F32A Depression, unspecified: Secondary | ICD-10-CM | POA: Diagnosis not present

## 2021-05-29 DIAGNOSIS — I10 Essential (primary) hypertension: Secondary | ICD-10-CM | POA: Diagnosis not present

## 2021-05-29 DIAGNOSIS — F419 Anxiety disorder, unspecified: Secondary | ICD-10-CM | POA: Diagnosis not present

## 2021-05-29 DIAGNOSIS — F0280 Dementia in other diseases classified elsewhere without behavioral disturbance: Secondary | ICD-10-CM | POA: Diagnosis not present

## 2021-05-29 DIAGNOSIS — E039 Hypothyroidism, unspecified: Secondary | ICD-10-CM | POA: Diagnosis not present

## 2021-05-29 DIAGNOSIS — I872 Venous insufficiency (chronic) (peripheral): Secondary | ICD-10-CM | POA: Diagnosis not present

## 2021-06-02 DIAGNOSIS — G4700 Insomnia, unspecified: Secondary | ICD-10-CM | POA: Diagnosis not present

## 2021-06-02 DIAGNOSIS — E039 Hypothyroidism, unspecified: Secondary | ICD-10-CM | POA: Diagnosis not present

## 2021-06-02 DIAGNOSIS — I10 Essential (primary) hypertension: Secondary | ICD-10-CM | POA: Diagnosis not present

## 2021-06-02 DIAGNOSIS — L97822 Non-pressure chronic ulcer of other part of left lower leg with fat layer exposed: Secondary | ICD-10-CM | POA: Diagnosis not present

## 2021-06-02 DIAGNOSIS — L89312 Pressure ulcer of right buttock, stage 2: Secondary | ICD-10-CM | POA: Diagnosis not present

## 2021-06-02 DIAGNOSIS — F32A Depression, unspecified: Secondary | ICD-10-CM | POA: Diagnosis not present

## 2021-06-02 DIAGNOSIS — F419 Anxiety disorder, unspecified: Secondary | ICD-10-CM | POA: Diagnosis not present

## 2021-06-02 DIAGNOSIS — I872 Venous insufficiency (chronic) (peripheral): Secondary | ICD-10-CM | POA: Diagnosis not present

## 2021-06-02 DIAGNOSIS — F0280 Dementia in other diseases classified elsewhere without behavioral disturbance: Secondary | ICD-10-CM | POA: Diagnosis not present

## 2021-06-05 DIAGNOSIS — I10 Essential (primary) hypertension: Secondary | ICD-10-CM | POA: Diagnosis not present

## 2021-06-05 DIAGNOSIS — G4700 Insomnia, unspecified: Secondary | ICD-10-CM | POA: Diagnosis not present

## 2021-06-05 DIAGNOSIS — I872 Venous insufficiency (chronic) (peripheral): Secondary | ICD-10-CM | POA: Diagnosis not present

## 2021-06-05 DIAGNOSIS — L97822 Non-pressure chronic ulcer of other part of left lower leg with fat layer exposed: Secondary | ICD-10-CM | POA: Diagnosis not present

## 2021-06-05 DIAGNOSIS — F0280 Dementia in other diseases classified elsewhere without behavioral disturbance: Secondary | ICD-10-CM | POA: Diagnosis not present

## 2021-06-05 DIAGNOSIS — E039 Hypothyroidism, unspecified: Secondary | ICD-10-CM | POA: Diagnosis not present

## 2021-06-05 DIAGNOSIS — F419 Anxiety disorder, unspecified: Secondary | ICD-10-CM | POA: Diagnosis not present

## 2021-06-05 DIAGNOSIS — L89312 Pressure ulcer of right buttock, stage 2: Secondary | ICD-10-CM | POA: Diagnosis not present

## 2021-06-05 DIAGNOSIS — F32A Depression, unspecified: Secondary | ICD-10-CM | POA: Diagnosis not present

## 2021-06-11 DIAGNOSIS — F419 Anxiety disorder, unspecified: Secondary | ICD-10-CM | POA: Diagnosis not present

## 2021-06-11 DIAGNOSIS — I10 Essential (primary) hypertension: Secondary | ICD-10-CM | POA: Diagnosis not present

## 2021-06-11 DIAGNOSIS — L97822 Non-pressure chronic ulcer of other part of left lower leg with fat layer exposed: Secondary | ICD-10-CM | POA: Diagnosis not present

## 2021-06-11 DIAGNOSIS — I872 Venous insufficiency (chronic) (peripheral): Secondary | ICD-10-CM | POA: Diagnosis not present

## 2021-06-11 DIAGNOSIS — F0280 Dementia in other diseases classified elsewhere without behavioral disturbance: Secondary | ICD-10-CM | POA: Diagnosis not present

## 2021-06-11 DIAGNOSIS — E039 Hypothyroidism, unspecified: Secondary | ICD-10-CM | POA: Diagnosis not present

## 2021-06-11 DIAGNOSIS — F32A Depression, unspecified: Secondary | ICD-10-CM | POA: Diagnosis not present

## 2021-06-11 DIAGNOSIS — G4700 Insomnia, unspecified: Secondary | ICD-10-CM | POA: Diagnosis not present

## 2021-06-11 DIAGNOSIS — L89312 Pressure ulcer of right buttock, stage 2: Secondary | ICD-10-CM | POA: Diagnosis not present

## 2021-06-12 DIAGNOSIS — I10 Essential (primary) hypertension: Secondary | ICD-10-CM | POA: Diagnosis not present

## 2021-06-12 DIAGNOSIS — F0280 Dementia in other diseases classified elsewhere without behavioral disturbance: Secondary | ICD-10-CM | POA: Diagnosis not present

## 2021-06-12 DIAGNOSIS — I872 Venous insufficiency (chronic) (peripheral): Secondary | ICD-10-CM | POA: Diagnosis not present

## 2021-06-12 DIAGNOSIS — E039 Hypothyroidism, unspecified: Secondary | ICD-10-CM | POA: Diagnosis not present

## 2021-06-12 DIAGNOSIS — L97822 Non-pressure chronic ulcer of other part of left lower leg with fat layer exposed: Secondary | ICD-10-CM | POA: Diagnosis not present

## 2021-06-12 DIAGNOSIS — L89312 Pressure ulcer of right buttock, stage 2: Secondary | ICD-10-CM | POA: Diagnosis not present

## 2021-06-12 DIAGNOSIS — F32A Depression, unspecified: Secondary | ICD-10-CM | POA: Diagnosis not present

## 2021-06-12 DIAGNOSIS — G4700 Insomnia, unspecified: Secondary | ICD-10-CM | POA: Diagnosis not present

## 2021-06-12 DIAGNOSIS — F419 Anxiety disorder, unspecified: Secondary | ICD-10-CM | POA: Diagnosis not present

## 2021-06-18 DIAGNOSIS — L89312 Pressure ulcer of right buttock, stage 2: Secondary | ICD-10-CM | POA: Diagnosis not present

## 2021-06-18 DIAGNOSIS — F0280 Dementia in other diseases classified elsewhere without behavioral disturbance: Secondary | ICD-10-CM | POA: Diagnosis not present

## 2021-06-18 DIAGNOSIS — I10 Essential (primary) hypertension: Secondary | ICD-10-CM | POA: Diagnosis not present

## 2021-06-18 DIAGNOSIS — F419 Anxiety disorder, unspecified: Secondary | ICD-10-CM | POA: Diagnosis not present

## 2021-06-18 DIAGNOSIS — L97822 Non-pressure chronic ulcer of other part of left lower leg with fat layer exposed: Secondary | ICD-10-CM | POA: Diagnosis not present

## 2021-06-18 DIAGNOSIS — E039 Hypothyroidism, unspecified: Secondary | ICD-10-CM | POA: Diagnosis not present

## 2021-06-18 DIAGNOSIS — F32A Depression, unspecified: Secondary | ICD-10-CM | POA: Diagnosis not present

## 2021-06-18 DIAGNOSIS — I872 Venous insufficiency (chronic) (peripheral): Secondary | ICD-10-CM | POA: Diagnosis not present

## 2021-06-18 DIAGNOSIS — G4700 Insomnia, unspecified: Secondary | ICD-10-CM | POA: Diagnosis not present

## 2021-06-23 DIAGNOSIS — F0280 Dementia in other diseases classified elsewhere without behavioral disturbance: Secondary | ICD-10-CM | POA: Diagnosis not present

## 2021-06-23 DIAGNOSIS — G4700 Insomnia, unspecified: Secondary | ICD-10-CM | POA: Diagnosis not present

## 2021-06-23 DIAGNOSIS — E039 Hypothyroidism, unspecified: Secondary | ICD-10-CM | POA: Diagnosis not present

## 2021-06-23 DIAGNOSIS — I10 Essential (primary) hypertension: Secondary | ICD-10-CM | POA: Diagnosis not present

## 2021-06-23 DIAGNOSIS — F419 Anxiety disorder, unspecified: Secondary | ICD-10-CM | POA: Diagnosis not present

## 2021-06-23 DIAGNOSIS — L97822 Non-pressure chronic ulcer of other part of left lower leg with fat layer exposed: Secondary | ICD-10-CM | POA: Diagnosis not present

## 2021-06-23 DIAGNOSIS — F32A Depression, unspecified: Secondary | ICD-10-CM | POA: Diagnosis not present

## 2021-06-23 DIAGNOSIS — L89312 Pressure ulcer of right buttock, stage 2: Secondary | ICD-10-CM | POA: Diagnosis not present

## 2021-06-23 DIAGNOSIS — I872 Venous insufficiency (chronic) (peripheral): Secondary | ICD-10-CM | POA: Diagnosis not present

## 2021-06-30 DIAGNOSIS — F32A Depression, unspecified: Secondary | ICD-10-CM | POA: Diagnosis not present

## 2021-06-30 DIAGNOSIS — I872 Venous insufficiency (chronic) (peripheral): Secondary | ICD-10-CM | POA: Diagnosis not present

## 2021-06-30 DIAGNOSIS — F419 Anxiety disorder, unspecified: Secondary | ICD-10-CM | POA: Diagnosis not present

## 2021-06-30 DIAGNOSIS — E039 Hypothyroidism, unspecified: Secondary | ICD-10-CM | POA: Diagnosis not present

## 2021-06-30 DIAGNOSIS — L89312 Pressure ulcer of right buttock, stage 2: Secondary | ICD-10-CM | POA: Diagnosis not present

## 2021-06-30 DIAGNOSIS — F0280 Dementia in other diseases classified elsewhere without behavioral disturbance: Secondary | ICD-10-CM | POA: Diagnosis not present

## 2021-06-30 DIAGNOSIS — I10 Essential (primary) hypertension: Secondary | ICD-10-CM | POA: Diagnosis not present

## 2021-06-30 DIAGNOSIS — G4700 Insomnia, unspecified: Secondary | ICD-10-CM | POA: Diagnosis not present

## 2021-06-30 DIAGNOSIS — L97822 Non-pressure chronic ulcer of other part of left lower leg with fat layer exposed: Secondary | ICD-10-CM | POA: Diagnosis not present

## 2021-07-02 DIAGNOSIS — E039 Hypothyroidism, unspecified: Secondary | ICD-10-CM | POA: Diagnosis not present

## 2021-07-02 DIAGNOSIS — I872 Venous insufficiency (chronic) (peripheral): Secondary | ICD-10-CM | POA: Diagnosis not present

## 2021-07-02 DIAGNOSIS — F419 Anxiety disorder, unspecified: Secondary | ICD-10-CM | POA: Diagnosis not present

## 2021-07-02 DIAGNOSIS — L89312 Pressure ulcer of right buttock, stage 2: Secondary | ICD-10-CM | POA: Diagnosis not present

## 2021-07-02 DIAGNOSIS — F0280 Dementia in other diseases classified elsewhere without behavioral disturbance: Secondary | ICD-10-CM | POA: Diagnosis not present

## 2021-07-02 DIAGNOSIS — F32A Depression, unspecified: Secondary | ICD-10-CM | POA: Diagnosis not present

## 2021-07-02 DIAGNOSIS — I10 Essential (primary) hypertension: Secondary | ICD-10-CM | POA: Diagnosis not present

## 2021-07-02 DIAGNOSIS — G4700 Insomnia, unspecified: Secondary | ICD-10-CM | POA: Diagnosis not present

## 2021-07-02 DIAGNOSIS — L97822 Non-pressure chronic ulcer of other part of left lower leg with fat layer exposed: Secondary | ICD-10-CM | POA: Diagnosis not present

## 2021-07-07 DIAGNOSIS — L97822 Non-pressure chronic ulcer of other part of left lower leg with fat layer exposed: Secondary | ICD-10-CM | POA: Diagnosis not present

## 2021-07-07 DIAGNOSIS — F0280 Dementia in other diseases classified elsewhere without behavioral disturbance: Secondary | ICD-10-CM | POA: Diagnosis not present

## 2021-07-07 DIAGNOSIS — L89312 Pressure ulcer of right buttock, stage 2: Secondary | ICD-10-CM | POA: Diagnosis not present

## 2021-07-07 DIAGNOSIS — E039 Hypothyroidism, unspecified: Secondary | ICD-10-CM | POA: Diagnosis not present

## 2021-07-07 DIAGNOSIS — I872 Venous insufficiency (chronic) (peripheral): Secondary | ICD-10-CM | POA: Diagnosis not present

## 2021-07-07 DIAGNOSIS — G4700 Insomnia, unspecified: Secondary | ICD-10-CM | POA: Diagnosis not present

## 2021-07-07 DIAGNOSIS — I10 Essential (primary) hypertension: Secondary | ICD-10-CM | POA: Diagnosis not present

## 2021-07-07 DIAGNOSIS — F32A Depression, unspecified: Secondary | ICD-10-CM | POA: Diagnosis not present

## 2021-07-07 DIAGNOSIS — F419 Anxiety disorder, unspecified: Secondary | ICD-10-CM | POA: Diagnosis not present

## 2021-07-09 DIAGNOSIS — L89312 Pressure ulcer of right buttock, stage 2: Secondary | ICD-10-CM | POA: Diagnosis not present

## 2021-07-09 DIAGNOSIS — I872 Venous insufficiency (chronic) (peripheral): Secondary | ICD-10-CM | POA: Diagnosis not present

## 2021-07-09 DIAGNOSIS — F419 Anxiety disorder, unspecified: Secondary | ICD-10-CM | POA: Diagnosis not present

## 2021-07-09 DIAGNOSIS — L97822 Non-pressure chronic ulcer of other part of left lower leg with fat layer exposed: Secondary | ICD-10-CM | POA: Diagnosis not present

## 2021-07-09 DIAGNOSIS — F0280 Dementia in other diseases classified elsewhere without behavioral disturbance: Secondary | ICD-10-CM | POA: Diagnosis not present

## 2021-07-09 DIAGNOSIS — F32A Depression, unspecified: Secondary | ICD-10-CM | POA: Diagnosis not present

## 2021-07-09 DIAGNOSIS — I10 Essential (primary) hypertension: Secondary | ICD-10-CM | POA: Diagnosis not present

## 2021-07-09 DIAGNOSIS — G4700 Insomnia, unspecified: Secondary | ICD-10-CM | POA: Diagnosis not present

## 2021-07-09 DIAGNOSIS — E039 Hypothyroidism, unspecified: Secondary | ICD-10-CM | POA: Diagnosis not present

## 2021-07-10 ENCOUNTER — Other Ambulatory Visit: Payer: Self-pay

## 2021-07-10 ENCOUNTER — Encounter (HOSPITAL_BASED_OUTPATIENT_CLINIC_OR_DEPARTMENT_OTHER): Payer: Medicare PPO | Attending: Internal Medicine | Admitting: Internal Medicine

## 2021-07-10 DIAGNOSIS — E782 Mixed hyperlipidemia: Secondary | ICD-10-CM | POA: Insufficient documentation

## 2021-07-10 DIAGNOSIS — F03918 Unspecified dementia, unspecified severity, with other behavioral disturbance: Secondary | ICD-10-CM | POA: Insufficient documentation

## 2021-07-10 DIAGNOSIS — I10 Essential (primary) hypertension: Secondary | ICD-10-CM | POA: Diagnosis not present

## 2021-07-10 DIAGNOSIS — L97812 Non-pressure chronic ulcer of other part of right lower leg with fat layer exposed: Secondary | ICD-10-CM | POA: Diagnosis not present

## 2021-07-10 DIAGNOSIS — I639 Cerebral infarction, unspecified: Secondary | ICD-10-CM | POA: Insufficient documentation

## 2021-07-10 NOTE — Progress Notes (Signed)
[name], [name] ([id number]) Visit Report for 07/10/2021 Chief Complaint Document Details Patient Name: Date of Service: [place], [place] C. 07/10/2021 9:00 A M Medical Record Number: [id number] Patient Account Number: [id number] Date of Birth/Sex: Treating RN: April 29, 1936 (86 y.o. Elam Dutch Primary Care Provider: Maurice Small Other Clinician: Referring Provider: Treating Provider/Extender: Theodosia Quay in Treatment: 0 Information Obtained from: Patient Chief Complaint Left lower extremity wounds Electronic Signature(s) Signed: 07/10/2021 10:35:46 AM By: Kalman Shan DO Entered By: Kalman Shan on 07/10/2021 10:25:46 -------------------------------------------------------------------------------- HPI Details Patient Name: Date of Service: [name], [name] 07/10/2021 9:00 A M Medical Record Number: [id number] Patient Account Number: [id number] Date of Birth/Sex: Treating RN: [birth year] (86 y.o. Elam Dutch Primary Care Provider: Maurice Small Other Clinician: Referring Provider: Treating Provider/Extender: Theodosia Quay in Treatment: 0 History of Present Illness HPI Description: Admission 07/10/2021 [name] is an 86 year old female with a past medical history of CVA, hyperlipidemia, dementia and peripheral arterial disease that presents to the clinic for 2 wounds to her left lower extremity. The most distal wound occurred 4 months ago. Due to dementia it is unclear how the wound started. It has been healing over the past several months albeit slowly. She is currently using Xeroform to this area. She has another wound that occurred after a fall 2 weeks ago. She has also been using Xeroform to this area. She denies signs of infection. She denies drainage from the wound beds currently. Electronic Signature(s) Signed: 07/10/2021 10:35:46 AM By: Kalman Shan DO Entered By: Kalman Shan on 07/10/2021  10:29:40 -------------------------------------------------------------------------------- Physical Exam Details Patient Name: Date of Service: [name], [name] 07/10/2021 9:00 A M Medical Record Number: [id number] Patient Account Number: [id number] Date of Birth/Sex: Treating RN: 02-Jun-1936 (86 y.o. Elam Dutch Primary Care Provider: Maurice Small Other Clinician: Referring Provider: Treating Provider/Extender: Theodosia Quay in Treatment: 0 Constitutional respirations regular, non-labored and within target range for patient.. Cardiovascular 2+ dorsalis pedis/posterior tibialis pulses. Psychiatric pleasant and cooperative. Notes Left lower extremity: 2 small open wounds with granulation tissue throughout. No drainage on exam. No surrounding signs of infection. Throughout the skin there is dry flaking erythematous patches. Electronic Signature(s) Signed: 07/10/2021 10:35:46 AM By: Kalman Shan DO Entered By: Kalman Shan on 07/10/2021 10:30:23 -------------------------------------------------------------------------------- Physician Orders Details Patient Name: Date of Service: [name], [name] 07/10/2021 9:00 A M Medical Record Number: [id number] Patient Account Number: [id number] Date of Birth/Sex: Treating RN: Apr 17, 1936 (86 y.o. Tonita Phoenix, Lauren Primary Care Provider: Maurice Small Other Clinician: Referring Provider: Treating Provider/Extender: Theodosia Quay in Treatment: 0 Verbal / Phone Orders: No Diagnosis Coding Follow-up Appointments ppointment in 1 week. - Dr. Heber [place]!! Return A Bathing/ Shower/ Hygiene May shower with protection but do not get wound dressing(s) wet. Edema Control - Lymphedema / SCD / Other Elevate legs to the level of the heart or above for 30 minutes daily and/or when sitting, a frequency of: Avoid standing for long periods of time. Moisturize legs daily. Home Health New wound care  orders this week; continue Home Health for wound care. May utilize formulary equivalent dressing for wound treatment orders unless otherwise specified. - [facility] to start using PRISMA and bordered dressing 2 x a week Wound Treatment Wound #1 - Lower Leg Wound Laterality: Left, Lateral Cleanser: Soap and Water [facility]) Every Other Day/15 Days Discharge Instructions: May shower and wash wound with dial antibacterial soap and water prior  to dressing change. Cleanser: Wound Cleanser [facility]) Every Other Day/15 Days Discharge Instructions: Cleanse the wound with wound cleanser prior to applying a clean dressing using gauze sponges, not tissue or cotton balls. Peri-Wound Care: Triamcinolone 15 (g) (Home Health) Every Other Day/15 Days Discharge Instructions: Use triamcinolone 15 (g) as directed Peri-Wound Care: Sween Lotion (Moisturizing lotion) (Home Health) Every Other Day/15 Days Discharge Instructions: Apply moisturizing lotion as directed Prim Dressing: Promogran Prisma Matrix, 4.34 (sq in) (silver collagen) (Home Health) Every Other Day/15 Days ary Discharge Instructions: Moisten collagen with saline or hydrogel Secondary Dressing: Bordered Gauze, 2x2 in [facility]) Every Other Day/15 Days Discharge Instructions: Apply over primary dressing as directed. Wound #2 - Lower Leg Wound Laterality: Left, Anterior Cleanser: Soap and Water [facility]) Every Other Day/15 Days Discharge Instructions: May shower and wash wound with dial antibacterial soap and water prior to dressing change. Cleanser: Wound Cleanser [facility]) Every Other Day/15 Days Discharge Instructions: Cleanse the wound with wound cleanser prior to applying a clean dressing using gauze sponges, not tissue or cotton balls. Peri-Wound Care: Triamcinolone 15 (g) (Home Health) Every Other Day/15 Days Discharge Instructions: Use triamcinolone 15 (g) as directed Peri-Wound Care: Sween Lotion (Moisturizing lotion) (Home  Health) Every Other Day/15 Days Discharge Instructions: Apply moisturizing lotion as directed Prim Dressing: Promogran Prisma Matrix, 4.34 (sq in) (silver collagen) (Home Health) Every Other Day/15 Days ary Discharge Instructions: Moisten collagen with saline or hydrogel Secondary Dressing: Bordered Gauze, 2x2 in [facility]) Every Other Day/15 Days Discharge Instructions: Apply over primary dressing as directed. Patient Medications llergies: Lexapro, hydrochlorothiazide A Notifications Medication Indication Start End 07/10/2021 triamcinolone acetonide DOSE 1 - topical 0.1 % cream - 1 application daily Electronic Signature(s) Signed: 07/10/2021 10:34:32 AM By: Kalman Shan DO Previous Signature: 07/10/2021 10:27:40 AM Version By: Rhae Hammock RN Entered By: Kalman Shan on 07/10/2021 10:34:32 -------------------------------------------------------------------------------- Problem List Details Patient Name: Date of Service: [name], [name] 07/10/2021 9:00 A M Medical Record Number: [id number] Patient Account Number: [id number] Date of Birth/Sex: Treating RN: 01/21/1936 (86 y.o. Elam Dutch Primary Care Provider: Maurice Small Other Clinician: Referring Provider: Treating Provider/Extender: Theodosia Quay in Treatment: 0 Active Problems ICD-10 Encounter Code Description Active Date MDM Diagnosis [id number] Non-pressure chronic ulcer of other part of right lower leg with fat layer 07/10/2021 No Yes exposed F03.918 Unspecified dementia, unspecified severity, with other behavioral disturbance 07/10/2021 No Yes I63.9 Cerebral infarction, unspecified 07/10/2021 No Yes E78.2 Mixed hyperlipidemia 07/10/2021 No Yes Inactive Problems Resolved Problems Electronic Signature(s) Signed: 07/10/2021 10:35:46 AM By: Kalman Shan DO Entered By: Kalman Shan on 07/10/2021  10:25:22 -------------------------------------------------------------------------------- Progress Note Details Patient Name: Date of Service: [name], [name] 07/10/2021 9:00 A M Medical Record Number: [id number] Patient Account Number: [id number] Date of Birth/Sex: Treating RN: [birth year] (86 y.o. Elam Dutch Primary Care Provider: Maurice Small Other Clinician: Referring Provider: Treating Provider/Extender: Theodosia Quay in Treatment: 0 Subjective Chief Complaint Information obtained from Patient Left lower extremity wounds History of Present Illness (HPI) Admission 07/10/2021 [name] is an 86 year old female with a past medical history of CVA, hyperlipidemia, dementia and peripheral arterial disease that presents to the clinic for 2 wounds to her left lower extremity. The most distal wound occurred 4 months ago. Due to dementia it is unclear how the wound started. It has been healing over the past several months albeit slowly. She is currently using Xeroform to this area. She has another wound that  occurred after a fall 2 weeks ago. She has also been using Xeroform to this area. She denies signs of infection. She denies drainage from the wound beds currently. Patient History Information obtained from Patient, Chart. Allergies Lexapro, hydrochlorothiazide Family History Unknown History. Social History Never smoker, Marital Status - Single, Alcohol Use - Never, Drug Use - No History, Caffeine Use - Rarely - coffee. Medical History Eyes Denies history of Cataracts, Glaucoma, Optic Neuritis Ear/Nose/Mouth/Throat Denies history of Chronic sinus problems/congestion, Middle ear problems Hematologic/Lymphatic Patient has history of Anemia Denies history of Hemophilia, Human Immunodeficiency Virus, Lymphedema, Sickle Cell Disease Respiratory Denies history of Aspiration, Asthma, Chronic Obstructive Pulmonary Disease (COPD), Pneumothorax, Sleep  Apnea, Tuberculosis Cardiovascular Patient has history of Hypertension Denies history of Angina, Arrhythmia, Congestive Heart Failure, Coronary Artery Disease, Deep Vein Thrombosis, Hypotension, Myocardial Infarction, Peripheral Arterial Disease, Peripheral Venous Disease, Phlebitis, Vasculitis Gastrointestinal Denies history of Cirrhosis , Colitis, Crohnoos, Hepatitis A, Hepatitis B, Hepatitis C Endocrine Denies history of Type I Diabetes, Type II Diabetes Genitourinary Denies history of End Stage Renal Disease Immunological Denies history of Lupus Erythematosus, Raynaudoos, Scleroderma Integumentary (Skin) Denies history of History of Burn Musculoskeletal Denies history of Gout, Rheumatoid Arthritis, Osteoarthritis, Osteomyelitis Neurologic Patient has history of Dementia Denies history of Neuropathy, Quadriplegia, Paraplegia, Seizure Disorder Hospitalization/Surgery History - cholecystectomy. - left shoulder surgery. - L THAAA-. Medical A Surgical History Notes nd Cardiovascular hyperlipidemia Gastrointestinal gout Endocrine prediabetes; hypothyroidism Review of Systems (ROS) Constitutional Symptoms (General Health) Denies complaints or symptoms of Fatigue, Fever, Chills, Marked Weight Change. Eyes Denies complaints or symptoms of Dry Eyes, Vision Changes, Glasses / Contacts. Ear/Nose/Mouth/Throat Denies complaints or symptoms of Chronic sinus problems or rhinitis. Respiratory Denies complaints or symptoms of Chronic or frequent coughs, Shortness of Breath. Cardiovascular Denies complaints or symptoms of Chest pain. Gastrointestinal Denies complaints or symptoms of Frequent diarrhea, Nausea, Vomiting. Endocrine Denies complaints or symptoms of Heat/cold intolerance. Genitourinary Denies complaints or symptoms of Frequent urination. Integumentary (Skin) Complains or has symptoms of Wounds. Musculoskeletal Denies complaints or symptoms of Muscle Pain, Muscle  Weakness. Neurologic Denies complaints or symptoms of Numbness/parasthesias. Psychiatric Denies complaints or symptoms of Claustrophobia, Suicidal. Objective Constitutional respirations regular, non-labored and within target range for patient.. Vitals Time Taken: 9:30 AM, Height: 65 in, Source: Stated, Weight: 169 lbs, Source: Stated, BMI: 28.1, Temperature: 97.7 F, Pulse: 74 bpm, Respiratory Rate: 17 breaths/min, Blood Pressure: 124/74 mmHg. Cardiovascular 2+ dorsalis pedis/posterior tibialis pulses. Psychiatric pleasant and cooperative. General Notes: Left lower extremity: 2 small open wounds with granulation tissue throughout. No drainage on exam. No surrounding signs of infection. Throughout the skin there is dry flaking erythematous patches. Integumentary (Hair, Skin) Wound #1 status is Open. Original cause of wound was Trauma. The date acquired was: 02/19/2021. The wound is located on the Left,Lateral Lower Leg. The wound measures 0.7cm length x 1cm width x 0.1cm depth; 0.55cm^2 area and 0.055cm^3 volume. There is no tunneling or undermining noted. There is a medium amount of serosanguineous drainage noted. The wound margin is distinct with the outline attached to the wound base. There is large (67-100%) red, Carly granulation within the wound bed. There is a small (1-33%) amount of necrotic tissue within the wound bed including Adherent Slough. Wound #2 status is Open. Original cause of wound was Trauma. The date acquired was: 06/26/2021. The wound is located on the Left,Anterior Lower Leg. The wound measures 0.5cm length x 0.3cm width x 0.1cm depth; 0.118cm^2 area and 0.012cm^3 volume. There is no tunneling or undermining noted.  There is a medium amount of serosanguineous drainage noted. The wound margin is distinct with the outline attached to the wound base. There is medium (34-66%) red, Carly granulation within the wound bed. There is a medium (34-66%) amount of necrotic tissue within  the wound bed including Adherent Slough. Assessment Active Problems ICD-10 Non-pressure chronic ulcer of other part of right lower leg with fat layer exposed Unspecified dementia, unspecified severity, with other behavioral disturbance Cerebral infarction, unspecified Mixed hyperlipidemia Patient presents with 2 wounds to her left lower extremity. The more distal wound occurred 4 months ago and is unclear how it started. At this time I recommended using collagen to this area since she is not having drainage. The more proximal wound occurred after a fall and I also recommended using collagen to this area. She has dry flaking skin that is pruritic throughout the leg. I recommended steroid cream to this. Her ABIs are 0.73. Since her wounds appear healing she does not need evaluation for vascular intervention at this time. If this changes we will refer her to vein and vascular. 46 minutes was spent on the encounter including face-to-face, EMR review and coordination of care. Plan Follow-up Appointments: Return Appointment in 1 week. - Dr. Heber [place]!! Bathing/ Shower/ Hygiene: May shower with protection but do not get wound dressing(s) wet. Edema Control - Lymphedema / SCD / Other: Elevate legs to the level of the heart or above for 30 minutes daily and/or when sitting, a frequency of: Avoid standing for long periods of time. Moisturize legs daily. Home Health: New wound care orders this week; continue Home Health for wound care. May utilize formulary equivalent dressing for wound treatment orders unless otherwise specified. - [facility] to start using PRISMA and bordered dressing 2 x a week The following medication(s) was prescribed: triamcinolone acetonide topical 0.1 % cream 1 1 application daily starting 07/10/2021 WOUND #1: - Lower Leg Wound Laterality: Left, Lateral Cleanser: Soap and Water [facility]) Every Other Day/15 Days Discharge Instructions: May shower and wash wound with dial  antibacterial soap and water prior to dressing change. Cleanser: Wound Cleanser [facility]) Every Other Day/15 Days Discharge Instructions: Cleanse the wound with wound cleanser prior to applying a clean dressing using gauze sponges, not tissue or cotton balls. Peri-Wound Care: Triamcinolone 15 (g) (Home Health) Every Other Day/15 Days Discharge Instructions: Use triamcinolone 15 (g) as directed Peri-Wound Care: Sween Lotion (Moisturizing lotion) (Home Health) Every Other Day/15 Days Discharge Instructions: Apply moisturizing lotion as directed Prim Dressing: Promogran Prisma Matrix, 4.34 (sq in) (silver collagen) (Home Health) Every Other Day/15 Days ary Discharge Instructions: Moisten collagen with saline or hydrogel Secondary Dressing: Bordered Gauze, 2x2 in [facility]) Every Other Day/15 Days Discharge Instructions: Apply over primary dressing as directed. WOUND #2: - Lower Leg Wound Laterality: Left, Anterior Cleanser: Soap and Water [facility]) Every Other Day/15 Days Discharge Instructions: May shower and wash wound with dial antibacterial soap and water prior to dressing change. Cleanser: Wound Cleanser [facility]) Every Other Day/15 Days Discharge Instructions: Cleanse the wound with wound cleanser prior to applying a clean dressing using gauze sponges, not tissue or cotton balls. Peri-Wound Care: Triamcinolone 15 (g) (Home Health) Every Other Day/15 Days Discharge Instructions: Use triamcinolone 15 (g) as directed Peri-Wound Care: Sween Lotion (Moisturizing lotion) (Home Health) Every Other Day/15 Days Discharge Instructions: Apply moisturizing lotion as directed Prim Dressing: Promogran Prisma Matrix, 4.34 (sq in) (silver collagen) (Home Health) Every Other Day/15 Days ary Discharge Instructions: Moisten collagen with saline or  hydrogel Secondary Dressing: Bordered Gauze, 2x2 in [facility]) Every Other Day/15 Days Discharge Instructions: Apply over primary dressing as  directed. 1. Silver collagen 2. Triamcinolone cream 3. Follow-up in 1 week Electronic Signature(s) Signed: 07/10/2021 10:35:46 AM By: Kalman Shan DO Entered By: Kalman Shan on 07/10/2021 10:34:47 -------------------------------------------------------------------------------- HxROS Details Patient Name: Date of Service: [name], [name] 07/10/2021 9:00 A M Medical Record Number: [id number] Patient Account Number: [id number] Date of Birth/Sex: Treating RN: 18-Oct-1935 (86 y.o. Tonita Phoenix, Lauren Primary Care Provider: Maurice Small Other Clinician: Referring Provider: Treating Provider/Extender: Theodosia Quay in Treatment: 0 Information Obtained From Patient Chart Constitutional Symptoms (General Health) Complaints and Symptoms: Negative for: Fatigue; Fever; Chills; Marked Weight Change Eyes Complaints and Symptoms: Negative for: Dry Eyes; Vision Changes; Glasses / Contacts Medical History: Negative for: Cataracts; Glaucoma; Optic Neuritis Ear/Nose/Mouth/Throat Complaints and Symptoms: Negative for: Chronic sinus problems or rhinitis Medical History: Negative for: Chronic sinus problems/congestion; Middle ear problems Respiratory Complaints and Symptoms: Negative for: Chronic or frequent coughs; Shortness of Breath Medical History: Negative for: Aspiration; Asthma; Chronic Obstructive Pulmonary Disease (COPD); Pneumothorax; Sleep Apnea; Tuberculosis Cardiovascular Complaints and Symptoms: Negative for: Chest pain Medical History: Positive for: Hypertension Negative for: Angina; Arrhythmia; Congestive Heart Failure; Coronary Artery Disease; Deep Vein Thrombosis; Hypotension; Myocardial Infarction; Peripheral Arterial Disease; Peripheral Venous Disease; Phlebitis; Vasculitis Past Medical History Notes: hyperlipidemia Gastrointestinal Complaints and Symptoms: Negative for: Frequent diarrhea; Nausea; Vomiting Medical History: Negative for:  Cirrhosis ; Colitis; Crohns; Hepatitis A; Hepatitis B; Hepatitis C Past Medical History Notes: gout Endocrine Complaints and Symptoms: Negative for: Heat/cold intolerance Medical History: Negative for: Type I Diabetes; Type II Diabetes Past Medical History Notes: prediabetes; hypothyroidism Genitourinary Complaints and Symptoms: Negative for: Frequent urination Medical History: Negative for: End Stage Renal Disease Integumentary (Skin) Complaints and Symptoms: Positive for: Wounds Medical History: Negative for: History of Burn Musculoskeletal Complaints and Symptoms: Negative for: Muscle Pain; Muscle Weakness Medical History: Negative for: Gout; Rheumatoid Arthritis; Osteoarthritis; Osteomyelitis Neurologic Complaints and Symptoms: Negative for: Numbness/parasthesias Medical History: Positive for: Dementia Negative for: Neuropathy; Quadriplegia; Paraplegia; Seizure Disorder Psychiatric Complaints and Symptoms: Negative for: Claustrophobia; Suicidal Hematologic/Lymphatic Medical History: Positive for: Anemia Negative for: Hemophilia; Human Immunodeficiency Virus; Lymphedema; Sickle Cell Disease Immunological Medical History: Negative for: Lupus Erythematosus; Raynauds; Scleroderma Oncologic Immunizations Pneumococcal Vaccine: Received Pneumococcal Vaccination: Yes Received Pneumococcal Vaccination On or After 60th Birthday: Yes Implantable Devices No devices added Hospitalization / Surgery History Type of Hospitalization/Surgery cholecystectomy left shoulder surgery L THAAA- Family and Social History Unknown History: Yes; Never smoker; Marital Status - Single; Alcohol Use: Never; Drug Use: No History; Caffeine Use: Rarely - coffee; Financial Concerns: No; Food, Clothing or Shelter Needs: No; Support System Lacking: No; Transportation Concerns: No Electronic Signature(s) Signed: 07/10/2021 10:27:40 AM By: Rhae Hammock RN Signed: 07/10/2021 10:35:46 AM By:  Kalman Shan DO Entered By: Rhae Hammock on 07/10/2021 09:38:11 -------------------------------------------------------------------------------- SuperBill Details Patient Name: Date of Service: [name], [name] 07/10/2021 Medical Record Number: [id number] Patient Account Number: [id number] Date of Birth/Sex: Treating RN: 06/12/1936 (87 y.o. Tonita Phoenix, Lauren Primary Care Provider: Maurice Small Other Clinician: Referring Provider: Treating Provider/Extender: Theodosia Quay in Treatment: 0 Diagnosis Coding ICD-10 Codes Code Description [id number] Non-pressure chronic ulcer of other part of right lower leg with fat layer exposed F03.918 Unspecified dementia, unspecified severity, with other behavioral disturbance I63.9 Cerebral infarction, unspecified E78.2 Mixed hyperlipidemia Facility Procedures CPT4 Code: [id number] Description: 99214 - WOUND CARE VISIT-LEV 4 EST PT Modifier: Quantity:  1 Physician Procedures : CPT4 Code Description Modifier [id number] [phone] - WC PHYS LEVEL 4 - NEW PT ICD-10 Diagnosis Description [id number] Non-pressure chronic ulcer of other part of right lower leg with fat layer exposed F03.918 Unspecified dementia, unspecified severity, with  other behavioral disturbance I63.9 Cerebral infarction, unspecified E78.2 Mixed hyperlipidemia Quantity: 1 Electronic Signature(s) Signed: 07/10/2021 10:35:46 AM By: Kalman Shan DO Previous Signature: 07/10/2021 10:27:40 AM Version By: Rhae Hammock RN Entered By: Kalman Shan on 07/10/2021 10:35:24

## 2021-07-10 NOTE — Progress Notes (Signed)
ELFRIEDE, BONINI ([id number]) Visit Report for 07/10/2021 Allergy List Details Patient Name: Date of Service: [name], [name] 07/10/2021 9:00 A M Medical Record Number: [id number] Patient Account Number: [id number] Date of Birth/Sex: Treating RN: 1935-07-12 (86 y.o. Ardis Rowan, Lauren Primary Care Timea Breed: [name] Other Clinician: Referring Deacon Gadbois: Treating Hedy Garro/Extender: Sallee Provencal Weeks in Treatment: 0 Allergies Active Allergies Lexapro hydrochlorothiazide Allergy Notes Electronic Signature(s) Signed: 07/10/2021 10:27:40 AM By: Fonnie Mu RN Entered By: Fonnie Mu on 07/10/2021 09:33:28 -------------------------------------------------------------------------------- Arrival Information Details Patient Name: Date of Service: [name], [name] 07/10/2021 9:00 A M Medical Record Number: [id number] Patient Account Number: [id number] Date of Birth/Sex: Treating RN: [birth year] (86 y.o. Ardis Rowan, Lauren Primary Care Siaosi Alter: [name] Other Clinician: Referring Amey Hossain: Treating Grettel Rames/Extender: Burman Riis in Treatment: 0 Visit Information Patient Arrived: Wheel Chair Arrival Time: 09:29 Accompanied By: daughter Transfer Assistance: Manual Patient Identification Verified: Yes Secondary Verification Process Completed: Yes Patient Requires Transmission-Based Precautions: No Patient Has Alerts: No Electronic Signature(s) Signed: 07/10/2021 10:27:40 AM By: Fonnie Mu RN Entered By: Fonnie Mu on 07/10/2021 09:30:11 -------------------------------------------------------------------------------- Clinic Level of Care Assessment Details Patient Name: Date of Service: [name], [name] 07/10/2021 9:00 A M Medical Record Number: [id number] Patient Account Number: [id number] Date of Birth/Sex: Treating RN: Oct 02, 1935 (86 y.o. Ardis Rowan, Lauren Primary Care Mily Malecki: [name] Other  Clinician: Referring Daley Mooradian: Treating Chawn Spraggins/Extender: Burman Riis in Treatment: 0 Clinic Level of Care Assessment Items TOOL 4 Quantity Score X- 1 0 Use when only an EandM is performed on FOLLOW-UP visit ASSESSMENTS - Nursing Assessment / Reassessment X- 1 10 Reassessment of Co-morbidities (includes updates in patient status) X- 1 5 Reassessment of Adherence to Treatment Plan ASSESSMENTS - Wound and Skin A ssessment / Reassessment []  - 0 Simple Wound Assessment / Reassessment - one wound X- 2 5 Complex Wound Assessment / Reassessment - multiple wounds []  - 0 Dermatologic / Skin Assessment (not related to wound area) ASSESSMENTS - Focused Assessment X- 1 5 Circumferential Edema Measurements - multi extremities []  - 0 Nutritional Assessment / Counseling / Intervention []  - 0 Lower Extremity Assessment (monofilament, tuning fork, pulses) []  - 0 Peripheral Arterial Disease Assessment (using hand held doppler) ASSESSMENTS - Ostomy and/or Continence Assessment and Care []  - 0 Incontinence Assessment and Management []  - 0 Ostomy Care Assessment and Management (repouching, etc.) PROCESS - Coordination of Care X - Simple Patient / Family Education for ongoing care 1 15 []  - 0 Complex (extensive) Patient / Family Education for ongoing care X- 1 10 Staff obtains , Records, T Results / Process Orders est X- 1 10 Staff telephones HHA, Nursing Homes / Clarify orders / etc []  - 0 Routine Transfer to another Facility (non-emergent condition) []  - 0 Routine Hospital Admission (non-emergent condition) X- 1 15 New Admissions / / Ordering NPWT Apligraf, etc. , []  - 0 Emergency Hospital Admission (emergent condition) X- 1 10 Simple Discharge Coordination []  - 0 Complex (extensive) Discharge Coordination PROCESS - Special Needs []  - 0 Pediatric / Minor Patient Management []  - 0 Isolation Patient Management []  -  0 Hearing / Language / Visual special needs []  - 0 Assessment of Community assistance (transportation, D/C planning, etc.) []  - 0 Additional assistance / Altered mentation []  - 0 Support Surface(s) Assessment (bed, cushion, seat, etc.) INTERVENTIONS - Wound Cleansing / Measurement []  - 0 Simple Wound Cleansing - one wound X- 2 5 Complex Wound Cleansing -  multiple wounds X- 1 5 Wound Imaging (photographs - any number of wounds) []  - 0 Wound Tracing (instead of photographs) []  - 0 Simple Wound Measurement - one wound X- 2 5 Complex Wound Measurement - multiple wounds INTERVENTIONS - Wound Dressings []  - 0 Small Wound Dressing one or multiple wounds X- 2 15 Medium Wound Dressing one or multiple wounds []  - 0 Large Wound Dressing one or multiple wounds X- 1 5 Application of Medications - topical []  - 0 Application of Medications - injection INTERVENTIONS - Miscellaneous []  - 0 External ear exam []  - 0 Specimen Collection (cultures, biopsies, blood, body fluids, etc.) []  - 0 Specimen(s) / Culture(s) sent or taken to Lab for analysis []  - 0 Patient Transfer (multiple staff / Nurse, adult / Similar devices) []  - 0 Simple Staple / Suture removal (25 or less) []  - 0 Complex Staple / Suture removal (26 or more) []  - 0 Hypo / Hyperglycemic Management (close monitor of Blood Glucose) []  - 0 Ankle / Brachial Index (ABI) - do not check if billed separately X- 1 5 Vital Signs Has the patient been seen at the hospital within the last three years: Yes Total Score: 155 Level Of Care: New/Established - Level 4 Electronic Signature(s) Signed: 07/10/2021 10:27:40 AM By: Fonnie Mu RN Entered By: Fonnie Mu on 07/10/2021 10:11:01 -------------------------------------------------------------------------------- Encounter Discharge Information Details Patient Name: Date of Service: [name], [name] 07/10/2021 9:00 A M Medical Record Number: [id number] Patient Account  Number: [id number] Date of Birth/Sex: Treating RN: 1936/06/16 (86 y.o. Ardis Rowan, Lauren Primary Care Xayden Linsey: [name] Other Clinician: Referring Eulia Hatcher: Treating Nashae Maudlin/Extender: Burman Riis in Treatment: 0 Encounter Discharge Information Items Discharge Condition: Stable Ambulatory Status: Wheelchair Discharge Destination: Home Transportation: Private Auto Accompanied By: daughter Schedule Follow-up Appointment: Yes Clinical Summary of Care: Patient Declined Electronic Signature(s) Signed: 07/10/2021 10:27:40 AM By: Fonnie Mu RN Entered By: Fonnie Mu on 07/10/2021 10:24:19 -------------------------------------------------------------------------------- Lower Extremity Assessment Details Patient Name: Date of Service: [name], [name] 07/10/2021 9:00 A M Medical Record Number: [id number] Patient Account Number: [id number] Date of Birth/Sex: Treating RN: 06-13-1936 (86 y.o. Ardis Rowan, Lauren Primary Care Alisa Stjames: [name] Other Clinician: Referring Natausha Jungwirth: Treating Field Staniszewski/Extender: Sallee Provencal Weeks in Treatment: 0 Edema Assessment Assessed: Kyra Searles: Yes] Franne Forts: No] Edema: [Left: Ye] [Right: s] Calf Left: Right: Point of Measurement: 37 cm From Medial Instep 34.5 cm Ankle Left: Right: Point of Measurement: 7 cm From Medial Instep 24 cm Vascular Assessment Pulses: Dorsalis Pedis Palpable: [Left:Yes] Posterior Tibial Palpable: [Left:Yes] Blood Pressure: Brachial: [Left:124] Ankle: [Left:Dorsalis Pedis: 90 0.73] Electronic Signature(s) Signed: 07/10/2021 10:27:40 AM By: Fonnie Mu RN Entered By: Fonnie Mu on 07/10/2021 10:01:53 -------------------------------------------------------------------------------- Multi Wound Chart Details Patient Name: Date of Service: [name], [name] 07/10/2021 9:00 A M Medical Record Number: [id number] Patient Account Number:  [id number] Date of Birth/Sex: Treating RN: [birth year] (86 y.o. Tommye Standard Primary Care Julius Matus: [name] Other Clinician: Referring Marcas Bowsher: Treating Culley Hedeen/Extender: Burman Riis in Treatment: 0 Vital Signs Height(in): 65 Pulse(bpm): 74 Weight(lbs): 169 Blood Pressure(mmHg): 124/74 Body Mass Index(BMI): 28 Temperature(F): 97.7 Respiratory Rate(breaths/min): 17 Photos: [1:Left, Lateral Lower Leg] [2:Left, Anterior Lower Leg] [N/A:N/A N/A] Wound Location: [1:Trauma] [2:Trauma] [N/A:N/A] Wounding Event: [1:Venous Leg Ulcer] [2:Trauma, Other] [N/A:N/A] Primary Etiology: [1:Anemia, Hypertension, Dementia] [2:Anemia, Hypertension, Dementia] [N/A:N/A] Comorbid History: [1:02/19/2021] [2:06/26/2021] [N/A:N/A] Date Acquired: [1:0] [2:0] [N/A:N/A] Weeks of Treatment: [1:Open] [2:Open] [N/A:N/A] Wound Status: [1:0.7x1x0.1] [2:0.5x0.3x0.1] [N/A:N/A] Measurements L  x W x D (cm) [1:0.55] [2:0.118] [N/A:N/A] A (cm) : rea [1:0.055] [2:0.012] [N/A:N/A] Volume (cm) : [1:0.00%] [2:0.00%] [N/A:N/A] % Reduction in A rea: [1:0.00%] [2:0.00%] [N/A:N/A] % Reduction in Volume: [1:Full Thickness Without Exposed] [2:Full Thickness Without Exposed] [N/A:N/A] Classification: [1:Support Structures Medium] [2:Support Structures Medium] [N/A:N/A] Exudate Amount: [1:Serosanguineous] [2:Serosanguineous] [N/A:N/A] Exudate Type: [1:red, brown] [2:red, brown] [N/A:N/A] Exudate Color: [1:Distinct, outline attached] [2:Distinct, outline attached] [N/A:N/A] Wound Margin: [1:Large (67-100%)] [2:Medium (34-66%)] [N/A:N/A] Granulation Amount: [1:Red, Pink] [2:Red, Pink] [N/A:N/A] Granulation Quality: [1:Small (1-33%)] [2:Medium (34-66%)] [N/A:N/A] Necrotic Amount: [1:Fascia: No] [2:Fascia: No] [N/A:N/A] Exposed Structures: [1:Fat Layer (Subcutaneous Tissue): No Tendon: No Muscle: No Joint: No Bone: No Small (1-33%)] [2:Fat Layer (Subcutaneous Tissue): No Tendon: No Muscle: No  Joint: No Bone: No None] [N/A:N/A] Treatment Notes Wound #1 (Lower Leg) Wound Laterality: Left, Lateral Cleanser Soap and Water Discharge Instruction: May shower and wash wound with dial antibacterial soap and water prior to dressing change. Wound Cleanser Discharge Instruction: Cleanse the wound with wound cleanser prior to applying a clean dressing using gauze sponges, not tissue or cotton balls. Peri-Wound Care Triamcinolone 15 (g) Discharge Instruction: Use triamcinolone 15 (g) as directed Sween Lotion (Moisturizing lotion) Discharge Instruction: Apply moisturizing lotion as directed Topical Primary Dressing Promogran Prisma Matrix, 4.34 (sq in) (silver collagen) Discharge Instruction: Moisten collagen with saline or hydrogel Secondary Dressing Bordered Gauze, 2x2 in Discharge Instruction: Apply over primary dressing as directed. Secured With Compression Wrap Compression Stockings Add-Ons Wound #2 (Lower Leg) Wound Laterality: Left, Anterior Cleanser Soap and Water Discharge Instruction: May shower and wash wound with dial antibacterial soap and water prior to dressing change. Wound Cleanser Discharge Instruction: Cleanse the wound with wound cleanser prior to applying a clean dressing using gauze sponges, not tissue or cotton balls. Peri-Wound Care Triamcinolone 15 (g) Discharge Instruction: Use triamcinolone 15 (g) as directed Sween Lotion (Moisturizing lotion) Discharge Instruction: Apply moisturizing lotion as directed Topical Primary Dressing Promogran Prisma Matrix, 4.34 (sq in) (silver collagen) Discharge Instruction: Moisten collagen with saline or hydrogel Secondary Dressing Bordered Gauze, 2x2 in Discharge Instruction: Apply over primary dressing as directed. Secured With Compression Wrap Compression Stockings Facilities managerAdd-Ons Electronic Signature(s) Signed: 07/10/2021 10:35:46 AM By: Geralyn CorwinHoffman, Jessica DO Signed: 07/10/2021 1:05:14 PM By: Zenaida DeedBoehlein, Linda RN,  BSN Entered By: Geralyn CorwinHoffman, Jessica on 07/10/2021 10:25:31 -------------------------------------------------------------------------------- Multi-Disciplinary Care Plan Details Patient Name: Date of Service: [name], [name] 07/10/2021 9:00 A M Medical Record Number: [id number] Patient Account Number: [id number] Date of Birth/Sex: Treating RN: 1935-08-26 (86 y.o. Ardis RowanF) Breedlove, Lauren Primary Care Kamariah Fruchter: Shirlean MylarWebb, Carol Other Clinician: Referring Philisha Weinel: Treating Colan Laymon/Extender: Burman RiisHoffman, Jessica Wolters, Sharon Weeks in Treatment: 0 Active Inactive Orientation to the Wound Care Program Nursing Diagnoses: Knowledge deficit related to the wound healing center program Goals: Patient/caregiver will verbalize understanding of the Wound Healing Center Program Date Initiated: 07/10/2021 Target Resolution Date: 07/24/2021 Goal Status: Active Interventions: Provide education on orientation to the wound center Notes: Wound/Skin Impairment Nursing Diagnoses: Impaired tissue integrity Knowledge deficit related to ulceration/compromised skin integrity Goals: Patient will have a decrease in wound volume by X% from date: (specify in notes) Date Initiated: 07/10/2021 Target Resolution Date: 07/25/2021 Goal Status: Active Patient/caregiver will verbalize understanding of skin care regimen Date Initiated: 07/10/2021 Target Resolution Date: 07/25/2021 Goal Status: Active Ulcer/skin breakdown will have a volume reduction of 30% by week 4 Date Initiated: 07/10/2021 Target Resolution Date: 07/25/2021 Goal Status: Active Interventions: Assess patient/caregiver ability to obtain necessary supplies Assess patient/caregiver ability to perform ulcer/skin care regimen upon admission  and as needed Assess ulceration(s) every visit Notes: Electronic Signature(s) Signed: 07/10/2021 10:27:40 AM By: Fonnie Mu RN Entered By: Fonnie Mu on 07/10/2021  10:08:47 -------------------------------------------------------------------------------- Pain Assessment Details Patient Name: Date of Service: [name], [name] 07/10/2021 9:00 A M Medical Record Number: [id number] Patient Account Number: [id number] Date of Birth/Sex: Treating RN: [birth year] (86 y.o. Ardis Rowan, Lauren Primary Care Kolsen Choe: [name] Other Clinician: Referring Nathan Moctezuma: Treating Jhalil Silvera/Extender: Burman Riis in Treatment: 0 Active Problems Location of Pain Severity and Description of Pain Patient Has Paino No Site Locations Pain Management and Medication Current Pain Management: Electronic Signature(s) Signed: 07/10/2021 10:27:40 AM By: Fonnie Mu RN Entered By: Fonnie Mu on 07/10/2021 10:07:07 -------------------------------------------------------------------------------- Patient/Caregiver Education Details Patient Name: Date of Service: Mel Almond, [name] 1/20/2023andnbsp9:00 A M Medical Record Number: [id number] Patient Account Number: [id number] Date of Birth/Gender: Treating RN: 1936-01-18 (86 y.o. Ardis Rowan, Lauren Primary Care Physician: [name] Other Clinician: Referring Physician: Treating Physician/Extender: Burman Riis in Treatment: 0 Education Assessment Education Provided To: Patient and Caregiver Education Topics Provided Welcome T [facility]: o Methods: Explain/Verbal Responses: State content correctly Electronic Signature(s) Signed: 07/10/2021 10:27:40 AM By: Fonnie Mu RN Entered By: Fonnie Mu on 07/10/2021 10:09:21 -------------------------------------------------------------------------------- Wound Assessment Details Patient Name: Date of Service: [name], [name] 07/10/2021 9:00 A M Medical Record Number: [id number] Patient Account Number: [id number] Date of Birth/Sex: Treating RN: [birth year] (86 y.o. Ardis Rowan,  Lauren Primary Care Morio Widen: [name] Other Clinician: Referring Mayleigh Tetrault: Treating Thao Vanover/Extender: Sallee Provencal Weeks in Treatment: 0 Wound Status Wound Number: 1 Primary Etiology: Venous Leg Ulcer Wound Location: Left, Lateral Lower Leg Wound Status: Open Wounding Event: Trauma Comorbid History: Anemia, Hypertension, Dementia Date Acquired: 02/19/2021 Weeks Of Treatment: 0 Clustered Wound: No Photos Wound Measurements Length: (cm) 0.7 Width: (cm) 1 Depth: (cm) 0.1 Area: (cm) 0.55 Volume: (cm) 0.055 % Reduction in Area: 0% % Reduction in Volume: 0% Epithelialization: Small (1-33%) Tunneling: No Undermining: No Wound Description Classification: Full Thickness Without Exposed Support Structures Wound Margin: Distinct, outline attached Exudate Amount: Medium Exudate Type: Serosanguineous Exudate Color: red, brown Foul Odor After Cleansing: No Slough/Fibrino Yes Wound Bed Granulation Amount: Large (67-100%) Exposed Structure Granulation Quality: Red, Pink Fascia Exposed: No Necrotic Amount: Small (1-33%) Fat Layer (Subcutaneous Tissue) Exposed: No Necrotic Quality: Adherent Slough Tendon Exposed: No Muscle Exposed: No Joint Exposed: No Bone Exposed: No Treatment Notes Wound #1 (Lower Leg) Wound Laterality: Left, Lateral Cleanser Soap and Water Discharge Instruction: May shower and wash wound with dial antibacterial soap and water prior to dressing change. Wound Cleanser Discharge Instruction: Cleanse the wound with wound cleanser prior to applying a clean dressing using gauze sponges, not tissue or cotton balls. Peri-Wound Care Triamcinolone 15 (g) Discharge Instruction: Use triamcinolone 15 (g) as directed Sween Lotion (Moisturizing lotion) Discharge Instruction: Apply moisturizing lotion as directed Topical Primary Dressing Promogran Prisma Matrix, 4.34 (sq in) (silver collagen) Discharge Instruction: Moisten collagen with  saline or hydrogel Secondary Dressing Bordered Gauze, 2x2 in Discharge Instruction: Apply over primary dressing as directed. Secured With Compression Wrap Compression Stockings Facilities manager) Signed: 07/10/2021 10:27:40 AM By: Fonnie Mu RN Entered By: Fonnie Mu on 07/10/2021 09:50:22 -------------------------------------------------------------------------------- Wound Assessment Details Patient Name: Date of Service: [name], [name] 07/10/2021 9:00 A M Medical Record Number: [id number] Patient Account Number: [id number] Date of Birth/Sex: Treating RN: [birth year] (86 y.o. Ardis Rowan, Lauren Primary Care Denissa Cozart: [name] Other Clinician:  Referring Briahna Pescador: Treating Javier Gell/Extender: Burman RiisHoffman, Jessica Wolters, Sharon Weeks in Treatment: 0 Wound Status Wound Number: 2 Primary Etiology: Trauma, Other Wound Location: Left, Anterior Lower Leg Wound Status: Open Wounding Event: Trauma Comorbid History: Anemia, Hypertension, Dementia Date Acquired: 06/26/2021 Weeks Of Treatment: 0 Clustered Wound: No Photos Wound Measurements Length: (cm) 0.5 Width: (cm) 0.3 Depth: (cm) 0.1 Area: (cm) 0.118 Volume: (cm) 0.012 % Reduction in Area: 0% % Reduction in Volume: 0% Epithelialization: None Tunneling: No Undermining: No Wound Description Classification: Full Thickness Without Exposed Support Structures Wound Margin: Distinct, outline attached Exudate Amount: Medium Exudate Type: Serosanguineous Exudate Color: red, brown Foul Odor After Cleansing: No Slough/Fibrino Yes Wound Bed Granulation Amount: Medium (34-66%) Exposed Structure Granulation Quality: Red, Pink Fascia Exposed: No Necrotic Amount: Medium (34-66%) Fat Layer (Subcutaneous Tissue) Exposed: No Necrotic Quality: Adherent Slough Tendon Exposed: No Muscle Exposed: No Joint Exposed: No Bone Exposed: No Treatment Notes Wound #2 (Lower Leg) Wound Laterality: Left,  Anterior Cleanser Soap and Water Discharge Instruction: May shower and wash wound with dial antibacterial soap and water prior to dressing change. Wound Cleanser Discharge Instruction: Cleanse the wound with wound cleanser prior to applying a clean dressing using gauze sponges, not tissue or cotton balls. Peri-Wound Care Triamcinolone 15 (g) Discharge Instruction: Use triamcinolone 15 (g) as directed Sween Lotion (Moisturizing lotion) Discharge Instruction: Apply moisturizing lotion as directed Topical Primary Dressing Promogran Prisma Matrix, 4.34 (sq in) (silver collagen) Discharge Instruction: Moisten collagen with saline or hydrogel Secondary Dressing Bordered Gauze, 2x2 in Discharge Instruction: Apply over primary dressing as directed. Secured With Compression Wrap Compression Stockings Facilities managerAdd-Ons Electronic Signature(s) Signed: 07/10/2021 10:27:40 AM By: Fonnie MuBreedlove, Lauren RN Entered By: Fonnie MuBreedlove, Lauren on 07/10/2021 09:50:55 -------------------------------------------------------------------------------- Vitals Details Patient Name: Date of Service: [name], [name] 07/10/2021 9:00 A M Medical Record Number: [id number] Patient Account Number: [id number] Date of Birth/Sex: Treating RN: 02/15/1936 ([age] y.o. Ardis RowanF) Breedlove, Lauren Primary Care Makell Drohan: Shirlean MylarWebb, Carol Other Clinician: Referring Shatia Sindoni: Treating Antwane Grose/Extender: Burman RiisHoffman, Jessica Wolters, Sharon Weeks in Treatment: 0 Vital Signs Time Taken: 09:30 Temperature (F): 97.7 Height (in): 65 Pulse (bpm): 74 Source: Stated Respiratory Rate (breaths/min): 17 Weight (lbs): 169 Blood Pressure (mmHg): 124/74 Source: Stated Reference Range: 80 - 120 mg / dl Body Mass Index (BMI): 28.1 Electronic Signature(s) Signed: 07/10/2021 10:27:40 AM By: Fonnie MuBreedlove, Lauren RN Entered By: Fonnie MuBreedlove, Lauren on 07/10/2021 09:32:19

## 2021-07-10 NOTE — Progress Notes (Signed)
[name], MAND ([id number]) Visit Report for 07/10/2021 Abuse/Suicide Risk Screen Details Patient Name: Date of Service: [name], [name] 07/10/2021 9:00 A M Medical Record Number: [id number] Patient Account Number: [id number] Date of Birth/Sex: Treating RN: 07/18/1935 (86 y.o. Female) Fonnie Mu Primary Care Evanny Ellerbe: [name] Other Clinician: Referring Terri Rorrer: Treating Leani Myron/Extender: Burman Riis in Treatment: 0 Abuse/Suicide Risk Screen Items Answer ABUSE RISK SCREEN: Has anyone close to you tried to hurt or harm you recentlyo No Do you feel uncomfortable with anyone in your familyo No Has anyone forced you do things that you didnt want to doo No Electronic Signature(s) Signed: 07/10/2021 10:27:40 AM By: Fonnie Mu RN Entered By: Fonnie Mu on 07/10/2021 09:38:35 -------------------------------------------------------------------------------- Activities of Daily Living Details Patient Name: Date of Service: [name], [name] 07/10/2021 9:00 A M Medical Record Number: [id number] Patient Account Number: [id number] Date of Birth/Sex: Treating RN: Oct 22, 1935 (86 y.o. Female) Fonnie Mu Primary Care Chasiti Waddington: [name] Other Clinician: Referring Jisella Ashenfelter: Treating Carolie Mcilrath/Extender: Burman Riis in Treatment: 0 Activities of Daily Living Items Answer Activities of Daily Living (Please select one for each item) Drive Automobile Not Able T Medications ake Need Assistance Use T elephone Need Assistance Care for Appearance Need Assistance Use T oilet Need Assistance Bath / Shower Need Assistance Dress Self Need Assistance Feed Self Need Assistance Walk Need Assistance Get In / Out Bed Need Assistance Housework Need Assistance Prepare Meals Need Assistance Handle Money Need Assistance Shop for Self Need Assistance Electronic Signature(s) Signed: 07/10/2021 10:27:40 AM By: Fonnie Mu  RN Entered By: Fonnie Mu on 07/10/2021 09:38:56 -------------------------------------------------------------------------------- Education Screening Details Patient Name: Date of Service: Carly LE, [name] 07/10/2021 9:00 A M Medical Record Number: [id number] Patient Account Number: [id number] Date of Birth/Sex: Treating RN: [birth year] (86 y.o. Female) Fonnie Mu Primary Care Angeleena Dueitt: [name] Other Clinician: Referring Lakechia Nay: Treating Amalya Salmons/Extender: Burman Riis in Treatment: 0 Primary Learner Assessed: Caregiver Reason Patient is not Primary Learner: dementia Learning Preferences/Education Level/Primary Language Learning Preference: Explanation, Demonstration, Communication Board, Printed Material Highest Education Level: High School Preferred Language: English Cognitive Barrier Language Barrier: No Translator Needed: No Memory Deficit: Yes dementia Emotional Barrier: No Cultural/Religious Beliefs Affecting Medical Care: No Physical Barrier Impaired Vision: Yes Glasses, reading Impaired Hearing: No Decreased Hand dexterity: No Knowledge/Comprehension Knowledge Level: High Comprehension Level: High Ability to understand written instructions: High Ability to understand verbal instructions: High Motivation Anxiety Level: Calm Cooperation: Cooperative Education Importance: Denies Need Interest in Health Problems: Asks Questions Perception: Coherent Willingness to Engage in Self-Management High Activities: Readiness to Engage in Self-Management High Activities: Electronic Signature(s) Signed: 07/10/2021 10:27:40 AM By: Fonnie Mu RN Entered By: Fonnie Mu on 07/10/2021 09:39:36 -------------------------------------------------------------------------------- Fall Risk Assessment Details Patient Name: Date of Service: Carly LE, [name] 07/10/2021 9:00 A M Medical Record Number: [id number] Patient Account  Number: [id number] Date of Birth/Sex: Treating RN: 01/04/1936 (86 y.o. Female) Fonnie Mu Primary Care Latrell Reitan: [name] Other Clinician: Referring Santina Trillo: Treating Jakobi Thetford/Extender: Burman Riis in Treatment: 0 Fall Risk Assessment Items Have you had 2 or more falls in the last 12 monthso 0 Yes Have you had any fall that resulted in injury in the last 12 monthso 0 Yes FALLS RISK SCREEN History of falling - immediate or within 3 months 25 Yes Secondary diagnosis (Do you have 2 or more medical diagnoseso) 0 No Ambulatory aid None/bed rest/wheelchair/nurse 0 Yes Crutches/cane/walker 0 No Furniture 0 No  Intravenous therapy Access/Saline/Heparin Lock 0 No Gait/Transferring Normal/ bed rest/ wheelchair 0 No Weak (short steps with or without shuffle, stooped but able to lift head while walking, may seek 10 Yes support from furniture) Impaired (short steps with shuffle, may have difficulty arising from chair, head down, impaired 0 No balance) Mental Status Oriented to own ability 0 No Electronic Signature(s) Signed: 07/10/2021 10:27:40 AM By: Fonnie Mu RN Entered By: Fonnie Mu on 07/10/2021 09:40:15 -------------------------------------------------------------------------------- Foot Assessment Details Patient Name: Date of Service: Carly LE, [name] 07/10/2021 9:00 A M Medical Record Number: [id number] Patient Account Number: [id number] Date of Birth/Sex: Treating RN: [birth year] (86 y.o. Female) Fonnie Mu Primary Care Terrin Imparato: [name] Other Clinician: Referring Morrison Mcbryar: Treating Breena Bevacqua/Extender: Burman Riis in Treatment: 0 Foot Assessment Items Site Locations + = Sensation present, - = Sensation absent, C = Callus, U = Ulcer R = Redness, W = Warmth, M = Maceration, PU = Pre-ulcerative lesion F = Fissure, S = Swelling, D = Dryness Assessment Right: Left: Other Deformity: No  No Prior Foot Ulcer: No No Prior Amputation: No No Charcot Joint: No No Ambulatory Status: Ambulatory With Help Assistance Device: Wheelchair Gait: Surveyor, mining) Signed: 07/10/2021 10:27:40 AM By: Fonnie Mu RN Entered By: Fonnie Mu on 07/10/2021 09:41:29 -------------------------------------------------------------------------------- Nutrition Risk Screening Details Patient Name: Date of Service: Carly LE, [name] 07/10/2021 9:00 A M Medical Record Number: [id number] Patient Account Number: [id number] Date of Birth/Sex: Treating RN: [birth year] (86 y.o. Female) Fonnie Mu Primary Care Nadiya Pieratt: [name] Other Clinician: Referring Gaege Sangalang: Treating Xitlali Kastens/Extender: Sallee Provencal Weeks in Treatment: 0 Height (in): 65 Weight (lbs): 169 Body Mass Index (BMI): 28.1 Nutrition Risk Screening Items Score Screening NUTRITION RISK SCREEN: I have an illness or condition that made me change the kind and/or amount of food I eat 0 No I eat fewer than two meals per day 0 No I eat few fruits and vegetables, or milk products 0 No I have three or more drinks of beer, liquor or wine almost every day 0 No I have tooth or mouth problems that make it hard for me to eat 0 No I don't always have enough money to buy the food I need 0 No I eat alone most of the time 0 No I take three or more different prescribed or over-the-counter drugs a day 0 No Without wanting to, I have lost or gained 10 pounds in the last six months 0 No I am not always physically able to shop, cook and/or feed myself 0 No Nutrition Protocols Good Risk Protocol 0 No interventions needed Moderate Risk Protocol High Risk Proctocol Risk Level: Good Risk Score: 0 Electronic Signature(s) Signed: 07/10/2021 10:27:40 AM By: Fonnie Mu RN Entered By: Fonnie Mu on 07/10/2021 09:40:23

## 2021-07-11 DIAGNOSIS — L89312 Pressure ulcer of right buttock, stage 2: Secondary | ICD-10-CM | POA: Diagnosis not present

## 2021-07-11 DIAGNOSIS — I10 Essential (primary) hypertension: Secondary | ICD-10-CM | POA: Diagnosis not present

## 2021-07-11 DIAGNOSIS — I872 Venous insufficiency (chronic) (peripheral): Secondary | ICD-10-CM | POA: Diagnosis not present

## 2021-07-11 DIAGNOSIS — L97822 Non-pressure chronic ulcer of other part of left lower leg with fat layer exposed: Secondary | ICD-10-CM | POA: Diagnosis not present

## 2021-07-11 DIAGNOSIS — G4700 Insomnia, unspecified: Secondary | ICD-10-CM | POA: Diagnosis not present

## 2021-07-11 DIAGNOSIS — F32A Depression, unspecified: Secondary | ICD-10-CM | POA: Diagnosis not present

## 2021-07-11 DIAGNOSIS — E039 Hypothyroidism, unspecified: Secondary | ICD-10-CM | POA: Diagnosis not present

## 2021-07-11 DIAGNOSIS — M109 Gout, unspecified: Secondary | ICD-10-CM | POA: Diagnosis not present

## 2021-07-11 DIAGNOSIS — F0280 Dementia in other diseases classified elsewhere without behavioral disturbance: Secondary | ICD-10-CM | POA: Diagnosis not present

## 2021-07-11 DIAGNOSIS — F419 Anxiety disorder, unspecified: Secondary | ICD-10-CM | POA: Diagnosis not present

## 2021-07-11 DIAGNOSIS — D649 Anemia, unspecified: Secondary | ICD-10-CM | POA: Diagnosis not present

## 2021-07-16 DIAGNOSIS — F419 Anxiety disorder, unspecified: Secondary | ICD-10-CM | POA: Diagnosis not present

## 2021-07-16 DIAGNOSIS — G4700 Insomnia, unspecified: Secondary | ICD-10-CM | POA: Diagnosis not present

## 2021-07-16 DIAGNOSIS — L97822 Non-pressure chronic ulcer of other part of left lower leg with fat layer exposed: Secondary | ICD-10-CM | POA: Diagnosis not present

## 2021-07-16 DIAGNOSIS — F32A Depression, unspecified: Secondary | ICD-10-CM | POA: Diagnosis not present

## 2021-07-16 DIAGNOSIS — E039 Hypothyroidism, unspecified: Secondary | ICD-10-CM | POA: Diagnosis not present

## 2021-07-16 DIAGNOSIS — F0280 Dementia in other diseases classified elsewhere without behavioral disturbance: Secondary | ICD-10-CM | POA: Diagnosis not present

## 2021-07-16 DIAGNOSIS — I872 Venous insufficiency (chronic) (peripheral): Secondary | ICD-10-CM | POA: Diagnosis not present

## 2021-07-16 DIAGNOSIS — I10 Essential (primary) hypertension: Secondary | ICD-10-CM | POA: Diagnosis not present

## 2021-07-16 DIAGNOSIS — L89312 Pressure ulcer of right buttock, stage 2: Secondary | ICD-10-CM | POA: Diagnosis not present

## 2021-07-20 ENCOUNTER — Encounter (HOSPITAL_BASED_OUTPATIENT_CLINIC_OR_DEPARTMENT_OTHER): Payer: Medicare PPO | Admitting: Internal Medicine

## 2021-07-23 DIAGNOSIS — I872 Venous insufficiency (chronic) (peripheral): Secondary | ICD-10-CM | POA: Diagnosis not present

## 2021-07-23 DIAGNOSIS — I10 Essential (primary) hypertension: Secondary | ICD-10-CM | POA: Diagnosis not present

## 2021-07-23 DIAGNOSIS — L97822 Non-pressure chronic ulcer of other part of left lower leg with fat layer exposed: Secondary | ICD-10-CM | POA: Diagnosis not present

## 2021-07-23 DIAGNOSIS — E039 Hypothyroidism, unspecified: Secondary | ICD-10-CM | POA: Diagnosis not present

## 2021-07-23 DIAGNOSIS — G4700 Insomnia, unspecified: Secondary | ICD-10-CM | POA: Diagnosis not present

## 2021-07-23 DIAGNOSIS — F32A Depression, unspecified: Secondary | ICD-10-CM | POA: Diagnosis not present

## 2021-07-23 DIAGNOSIS — F419 Anxiety disorder, unspecified: Secondary | ICD-10-CM | POA: Diagnosis not present

## 2021-07-23 DIAGNOSIS — F0280 Dementia in other diseases classified elsewhere without behavioral disturbance: Secondary | ICD-10-CM | POA: Diagnosis not present

## 2021-07-23 DIAGNOSIS — L89312 Pressure ulcer of right buttock, stage 2: Secondary | ICD-10-CM | POA: Diagnosis not present

## 2021-07-24 ENCOUNTER — Other Ambulatory Visit: Payer: Self-pay

## 2021-07-24 ENCOUNTER — Telehealth: Payer: Medicare PPO | Admitting: Physician Assistant

## 2021-07-24 NOTE — Progress Notes (Incomplete)
Virtual Visit via Video Note The purpose of this virtual visit is to provide medical care while limiting exposure to the novel coronavirus.    Consent was obtained for video visit:  Yes.   Answered questions that patient had about telehealth interaction:  Yes.   I discussed the limitations, risks, security and privacy concerns of performing an evaluation and management service by telemedicine. I also discussed with the patient that there may be a patient responsible charge related to this service. The patient expressed understanding and agreed to proceed.  Pt location: Home Physician Location: office Name of referring provider:  Maurice Small, MD I connected with [name] at patients initiation/request on 07/24/2021 at 11:00 AM EST by video enabled telemedicine application and verified that I am speaking with the correct person using two identifiers. Pt MRN:  [id number] Pt DOB:  08-04-1935 Video Participants:  [name];  [name] (granddaughter)  12/31/20  History of Present Illness:  The patient had a virtual video visit on 12/31/2020. She was last seen in the Neurology clinic 7 months ago for dementia with behavioral disturbance. She is again accompanied by her granddaughter [name] who helps supplement the history today. Since her last visit, she underwent hip surgery last May 2022 and recovered really well. She did not need hydrocodone and walking is great now. Sleep is good. [name] reports she is doing very, very good on her current regimen. [name] manages medications, finances. She does not drive. She is independent with dressing and bathing. No further significant behavioral issues. She denies any headaches, dizziness, vision changes, focal numbness/tingling, no falls. She is on Memantine 5mg  daily, Gabapentin 100mg  in AM, 200mg  in PM, olanzapine 10mg  1/2 tab in AM, 1 tab qhs, Sertraline 50mg  daily, and Trazodone 100mg  qhs without side effects.   History on Initial Assessment  01/01/2020: This is a pleasant 86 year old right-handed woman with a history of hypertension, hyperlipidemia, prior stroke with no residual deficits, presenting for evaluation of auditory hallucinations and memory loss. She states her memory "comes and goes." She lives with her granddaughter [name] and daughter-in-law [name]. [name] started noticing memory changes the last couple of years where she would be repeating herself. Recently, memory has worsened, she had 2 appointments in one day and did not remember the afternoon appointment. They have had to write down notes more. She manages her own medications and [name] has not noticed any issues with forgetting medications. She used to live with her husband then he passed away and she lived alone in [place] for 9 months, before she moved in with [name] 2 years ago. She was not missing any bill payments while alone, bills were switched to autopay 2 years ago. She continues to drive around [place] and denies getting lost. She was getting lost in [place], but they feel it was due to being in an unfamiliar place. [name] started noticing auditory hallucinations while she was still living alone, she would say something was not right with the air, or the doors, so [name] moved her. She was saying the she would hear cars at night or someone trying to break in, barricading her door. She would say some boys were drinking beside her condo or she had mice in the condo and put traps out but never caught any. Since moving in with [name] 2 years ago, she continued to have hallucinations, mostly at night initially, saying something was in the bed with her. Hallucinations have progressively worsened the past few months, also  occurring in the daytime. She does not remember what she hears at night, [name] would remind her that she tells [name] she hears her granddaughter calling her "[name]" at night or during the day when the granddaughter is not home. She denies any visual  hallucinations, however [name] reminds her the other day she thought she saw someone getting in the window, she heard things outside and said they were packing things into delivery trucks. She saw arms lifting things up, loading trucks up. She saw a shadow come back the other day on the 2nd level window. She would be afraid he would get a hold of the family.  She would be up all night, knocking on their doors telling them about the hallucinations. Melatonin did not help. Family brought her to the ER a week ago, CBC, CMP, urinalysis unremarkable except for anemia. I personally reviewed head CT without contrast which did not show any acute changes, there was diffuse cerebral and cerebellar atrophy, moderately extensive chronic microvascular disease. They were instructed to give Tylenol PM to help her sleep at night, she has been taking 2 tabs qhs and has been sleeping all night with this since then. No REM behavior disorder noted. [name] denies any prior psychiatric history. No hygiene concerns, she is able to bathe and dress independently. Her mother and father had Alzheimer's disease. No history of significant head injuries. She has not had any alcohol in a while.   She denies any headaches, dizziness, diplopia, dysarthria/dysphagia, neck/back pain, focal numbness/tingling/weakness, bowel/bladder dysfunction, anosmia, or tremors. Mood comes and goes, "but not bad." Her last fall was in October 2020 when she tripped. She is scheduled for left hip replacement on 01/28/20.   Update 02/04/2020: Since her last visit, she was brought to the ER on 01/04/20 for worsening behaviors with auditory and visual hallucinations about babies crying while accusing family of killing the crying babies. She would run out of the house at all times at night. The police was in their home when she drove off without her phone. An IVC had to be issued so police could get her. She was brought to the ER where bloodwork was unremarkable. UA showed  moderate leukocytes, 6-10 WBC, culture negative. UDS negative. She had an MRI brain on 01/05/20 which did not show any acute changes. There was diffuse cerebral atrophy, moderate chronic microvascular disease. She was admitted to [facility] from 7/20 to 8/4. She was started on gabapentin 150mg  every 8 hours (250mg /85mL taking 3 mLs TID), Memantine 5mg  daily, olanzapine 10mg  qhs, Sertraline 50mg  daily, and Trazodone 50mg  qhs. Donepezil was stopped since she had significant worsening the day she started it. She has had significant improvement since hospital discharge. No further hallucinations or delusions.    Current Outpatient Medications on File Prior to Visit  Medication Sig Dispense Refill   aspirin EC 81 MG tablet Take 1 tablet (81 mg total) by mouth 2 (two) times daily. (Patient taking differently: Take 81 mg by mouth daily.) 60 tablet 0   gabapentin (NEURONTIN) 100 MG capsule Take 1 cap in AM, 2 caps in PM 270 capsule 3   levothyroxine (SYNTHROID) 50 MCG tablet Take 50 mcg by mouth daily before breakfast.     lisinopril (ZESTRIL) 5 MG tablet Take 5 mg by mouth in the morning.     memantine (NAMENDA) 5 MG tablet Take 1 tablet (5 mg total) by mouth in the morning. 90 tablet 3   metoprolol succinate (TOPROL-XL) 50 MG 24 hr tablet  Take 25 mg by mouth in the morning.     OLANZapine (ZYPREXA) 10 MG tablet TAKE 1/2 TABLET IN THE MORNING AND TAKE 1 TABLET AT BEDTIME 135 tablet 1   oxyCODONE-acetaminophen (PERCOCET/ROXICET) 5-325 MG tablet Take 1 tablet by mouth every 4 (four) hours as needed for severe pain. (Patient not taking: Reported on 12/31/2020) 30 tablet 0   sertraline (ZOLOFT) 50 MG tablet Take 1 tablet (50 mg total) by mouth daily. 90 tablet 3   simvastatin (ZOCOR) 20 MG tablet Take 20 mg by mouth at bedtime.      tiZANidine (ZANAFLEX) 2 MG tablet Take 1 tablet (2 mg total) by mouth every 6 (six) hours as needed. (Patient not taking: Reported on 12/31/2020) 60 tablet 0    traZODone (DESYREL) 50 MG tablet TAKE 2 TABLETS AT BEDTIME 180 tablet 3   No current facility-administered medications on file prior to visit.     Observations/Objective:   GEN:  The patient appears stated age and is in NAD.  Neurological examination: Patient is awake, alert, oriented to person, place. Stated month is June then corrected to July. No aphasia or dysarthria. Intact fluency and comprehension. Remote and recent memory impaired, 0/3 delayed recall. Cranial nerves: Extraocular movements intact with no nystagmus. No facial asymmetry. Hard of hearing. Motor: moves all extremities symmetrically, at least anti-gravity x 4. Gait: slow and cautious, no ataxia   Assessment and Plan:   This is a pleasant 86 yo RH woman with a history of hypertension, hyperlipidemia, prior stroke with no residual deficits, with likely Alzheimer's disease with behavioral changes. MRI brain no acute changes, there was diffuse cerebral atrophy, moderate chronic microvascular disease. Psychosis controlled on current regimen, continue olanzapine 10mg  1/2 tab in AM, 1 tab qhs, sertraline 50mg  daily, Trazodone 100mg  qhs, Memantine 5mg  daily, gabapentin 100mg  in AM, 200mg  qhs. Continue 24/7 care. Follow-up in 6 months, call for any changes.    Follow Up Instructions:   -I discussed the assessment and treatment plan with the patient/granddaughter. The patient/granddaughter were provided an opportunity to ask questions and all were answered. The patient/granddaughter agreed with the plan and demonstrated an understanding of the instructions.   The patient/granddaughter were advised to call back or seek an in-person evaluation if the symptoms worsen or if the condition fails to improve as anticipated.     Sharene Butters, PA-C

## 2021-07-27 ENCOUNTER — Other Ambulatory Visit: Payer: Self-pay

## 2021-07-27 ENCOUNTER — Encounter (HOSPITAL_BASED_OUTPATIENT_CLINIC_OR_DEPARTMENT_OTHER): Payer: Medicare PPO | Attending: Internal Medicine | Admitting: Internal Medicine

## 2021-07-27 DIAGNOSIS — I10 Essential (primary) hypertension: Secondary | ICD-10-CM | POA: Insufficient documentation

## 2021-07-27 DIAGNOSIS — E782 Mixed hyperlipidemia: Secondary | ICD-10-CM | POA: Diagnosis not present

## 2021-07-27 DIAGNOSIS — I639 Cerebral infarction, unspecified: Secondary | ICD-10-CM

## 2021-07-27 DIAGNOSIS — L97812 Non-pressure chronic ulcer of other part of right lower leg with fat layer exposed: Secondary | ICD-10-CM | POA: Diagnosis not present

## 2021-07-27 DIAGNOSIS — Z8673 Personal history of transient ischemic attack (TIA), and cerebral infarction without residual deficits: Secondary | ICD-10-CM | POA: Insufficient documentation

## 2021-07-27 DIAGNOSIS — Z9049 Acquired absence of other specified parts of digestive tract: Secondary | ICD-10-CM | POA: Insufficient documentation

## 2021-07-27 DIAGNOSIS — F03918 Unspecified dementia, unspecified severity, with other behavioral disturbance: Secondary | ICD-10-CM | POA: Diagnosis not present

## 2021-07-27 NOTE — Progress Notes (Signed)
[name] ([id number]) Visit Report for 07/27/2021 Arrival Information Details Patient Name: Date of Service: [name], [name] 07/27/2021 9:30 A M Medical Record Number: [id number] Patient Account Number: [id number] Date of Birth/Sex: Treating RN: [birth year] (86 y.o. [name], [name] Primary Care Trinton Prewitt: [name] Other Clinician: Referring Rheta Hemmelgarn: Treating Netta Fodge/Extender: Donato Heinz in Treatment: 2 Visit Information History Since Last Visit Added or deleted any medications: No Patient Arrived: Ambulatory Any new allergies or adverse reactions: No Arrival Time: 09:49 Had a fall or experienced change in No Accompanied By: family member activities of daily living that may affect Transfer Assistance: None risk of falls: Patient Identification Verified: Yes Signs or symptoms of abuse/neglect since last visito No Secondary Verification Process Completed: Yes Hospitalized since last visit: No Patient Requires Transmission-Based Precautions: No Implantable device outside of the clinic excluding No Patient Has Alerts: No cellular tissue based products placed in the center since last visit: Has Dressing in Place as Prescribed: Yes Pain Present Now: No Electronic Signature(s) Signed: 07/27/2021 4:47:34 PM By: Shawn Stall RN, BSN Entered By: Shawn Stall on 07/27/2021 09:52:50 -------------------------------------------------------------------------------- Clinic Level of Care Assessment Details Patient Name: Date of Service: Carly LE, [name] 07/27/2021 9:30 A M Medical Record Number: [id number] Patient Account Number: [id number] Date of Birth/Sex: Treating RN: 11-26-1935 (86 y.o. [name], [name] Primary Care Sarajane Fambrough: [name] Other Clinician: Referring Karell Tukes: Treating Candelaria Pies/Extender: Donato Heinz in Treatment: 2 Clinic Level of Care Assessment Items TOOL 4 Quantity Score X- 1 0 Use when only an EandM is  performed on FOLLOW-UP visit ASSESSMENTS - Nursing Assessment / Reassessment X- 1 10 Reassessment of Carly-morbidities (includes updates in patient status) X- 1 5 Reassessment of Adherence to Treatment Plan ASSESSMENTS - Wound and Skin A ssessment / Reassessment []  - 0 Simple Wound Assessment / Reassessment - one wound X- 2 5 Complex Wound Assessment / Reassessment - multiple wounds []  - 0 Dermatologic / Skin Assessment (not related to wound area) ASSESSMENTS - Focused Assessment X- 1 5 Circumferential Edema Measurements - multi extremities []  - 0 Nutritional Assessment / Counseling / Intervention []  - 0 Lower Extremity Assessment (monofilament, tuning fork, pulses) []  - 0 Peripheral Arterial Disease Assessment (using hand held doppler) ASSESSMENTS - Ostomy and/or Continence Assessment and Care []  - 0 Incontinence Assessment and Management []  - 0 Ostomy Care Assessment and Management (repouching, etc.) PROCESS - Coordination of Care []  - 0 Simple Patient / Family Education for ongoing care X- 1 20 Complex (extensive) Patient / Family Education for ongoing care X- 1 10 Staff obtains , Records, T Results / Process Orders est []  - 0 Staff telephones HHA, Nursing Homes / Clarify orders / etc []  - 0 Routine Transfer to another Facility (non-emergent condition) []  - 0 Routine Hospital Admission (non-emergent condition) []  - 0 New Admissions / / Ordering NPWT Apligraf, etc. , []  - 0 Emergency Hospital Admission (emergent condition) []  - 0 Simple Discharge Coordination X- 1 15 Complex (extensive) Discharge Coordination PROCESS - Special Needs []  - 0 Pediatric / Minor Patient Management []  - 0 Isolation Patient Management []  - 0 Hearing / Language / Visual special needs []  - 0 Assessment of Community assistance (transportation, D/C planning, etc.) []  - 0 Additional assistance / Altered mentation []  - 0 Support Surface(s) Assessment  (bed, cushion, seat, etc.) INTERVENTIONS - Wound Cleansing / Measurement []  - 0 Simple Wound Cleansing - one wound X- 2 5 Complex Wound Cleansing -  multiple wounds X- 1 5 Wound Imaging (photographs - any number of wounds) []  - 0 Wound Tracing (instead of photographs) []  - 0 Simple Wound Measurement - one wound X- 2 5 Complex Wound Measurement - multiple wounds INTERVENTIONS - Wound Dressings []  - 0 Small Wound Dressing one or multiple wounds []  - 0 Medium Wound Dressing one or multiple wounds []  - 0 Large Wound Dressing one or multiple wounds []  - 0 Application of Medications - topical []  - 0 Application of Medications - injection INTERVENTIONS - Miscellaneous []  - 0 External ear exam []  - 0 Specimen Collection (cultures, biopsies, blood, body fluids, etc.) []  - 0 Specimen(s) / Culture(s) sent or taken to Lab for analysis []  - 0 Patient Transfer (multiple staff / Nurse, adult / Similar devices) []  - 0 Simple Staple / Suture removal (25 or less) []  - 0 Complex Staple / Suture removal (26 or more) []  - 0 Hypo / Hyperglycemic Management (close monitor of Blood Glucose) []  - 0 Ankle / Brachial Index (ABI) - do not check if billed separately X- 1 5 Vital Signs Has the patient been seen at the hospital within the last three years: Yes Total Score: 105 Level Of Care: New/Established - Level 3 Electronic Signature(s) Signed: 07/27/2021 4:47:34 PM By: Shawn Stall RN, BSN Entered By: Shawn Stall on 07/27/2021 10:47:22 -------------------------------------------------------------------------------- Encounter Discharge Information Details Patient Name: Date of Service: Carly LE, [name] 07/27/2021 9:30 A M Medical Record Number: [id number] Patient Account Number: [id number] Date of Birth/Sex: Treating RN: 09-08-1935 (86 y.o. [name], [name] Primary Care Evelise Reine: [name] Other Clinician: Referring Jaysten Essner: Treating Roemello Speyer/Extender: Donato Heinz in Treatment: 2 Encounter Discharge Information Items Discharge Condition: Stable Ambulatory Status: Ambulatory Discharge Destination: Home Transportation: Private Auto Accompanied By: family member Schedule Follow-up Appointment: No Clinical Summary of Care: Electronic Signature(s) Signed: 07/27/2021 4:47:34 PM By: Shawn Stall RN, BSN Entered By: Shawn Stall on 07/27/2021 10:51:33 -------------------------------------------------------------------------------- Lower Extremity Assessment Details Patient Name: Date of Service: Carly LE, [name] 07/27/2021 9:30 A M Medical Record Number: [id number] Patient Account Number: [id number] Date of Birth/Sex: Treating RN: 1935-08-25 (86 y.o. [name], [name] Primary Care Briar Witherspoon: [name] Other Clinician: Referring Angelina Venard: Treating Taletha Twiford/Extender: Donato Heinz in Treatment: 2 Edema Assessment Assessed: [Left: Yes] [Right: No] Edema: [Left: N] [Right: o] Calf Left: Right: Point of Measurement: 37 cm From Medial Instep 37 cm Ankle Left: Right: Point of Measurement: 7 cm From Medial Instep 25 cm Vascular Assessment Pulses: Dorsalis Pedis Palpable: [Left:Yes] Electronic Signature(s) Signed: 07/27/2021 4:47:34 PM By: Shawn Stall RN, BSN Entered By: Shawn Stall on 07/27/2021 09:56:14 -------------------------------------------------------------------------------- Multi Wound Chart Details Patient Name: Date of Service: Carly LE, [name] 07/27/2021 9:30 A M Medical Record Number: [id number] Patient Account Number: [id number] Date of Birth/Sex: Treating RN: 21-May-1936 (86 y.o. Roel Cluck Primary Care Sharel Behne: [name] Other Clinician: Referring Laraina Sulton: Treating Michae Grimley/Extender: Donato Heinz in Treatment: 2 Vital Signs Height(in): 65 Pulse(bpm): 108 Weight(lbs): 169 Blood Pressure(mmHg): 112/73 Body Mass Index(BMI): 28.1 Temperature(F):  97.7 Respiratory Rate(breaths/min): 20 Photos: [N/A:N/A] Left, Lateral Lower Leg Left, Anterior Lower Leg N/A Wound Location: Trauma Trauma N/A Wounding Event: Venous Leg Ulcer Trauma, Other N/A Primary Etiology: Anemia, Hypertension, Dementia Anemia, Hypertension, Dementia N/A Comorbid History: 02/19/2021 06/26/2021 N/A Date Acquired: 2 2 N/A Weeks of Treatment: Healed - Epithelialized Open N/A Wound Status: No No N/A Wound Recurrence: 0x0x0 0x0x0 N/A Measurements L x W x D (  cm) 0 0 N/A A (cm) : rea 0 0 N/A Volume (cm) : 100.00% 100.00% N/A % Reduction in A rea: 100.00% 100.00% N/A % Reduction in Volume: Full Thickness Without Exposed Full Thickness Without Exposed N/A Classification: Support Structures Support Structures None Present None Present N/A Exudate Amount: Distinct, outline attached Distinct, outline attached N/A Wound Margin: None Present (0%) None Present (0%) N/A Granulation Amount: None Present (0%) None Present (0%) N/A Necrotic Amount: Fascia: No Fascia: No N/A Exposed Structures: Fat Layer (Subcutaneous Tissue): No Fat Layer (Subcutaneous Tissue): No Tendon: No Tendon: No Muscle: No Muscle: No Joint: No Joint: No Bone: No Bone: No Large (67-100%) Large (67-100%) N/A Epithelialization: Treatment Notes Electronic Signature(s) Signed: 07/27/2021 11:17:40 AM By: Geralyn Corwin DO Signed: 07/27/2021 4:43:35 PM By: Antonieta Iba Entered By: Geralyn Corwin on 07/27/2021 11:00:47 -------------------------------------------------------------------------------- Multi-Disciplinary Care Plan Details Patient Name: Date of Service: Carly LE, [name] 07/27/2021 9:30 A M Medical Record Number: [id number] Patient Account Number: [id number] Date of Birth/Sex: Treating RN: [birth year] (86 y.o. Arta Silence Primary Care Sam Overbeck: [name] Other Clinician: Referring Boyde Grieco: Treating Kolbee Bogusz/Extender: Donato Heinz in  Treatment: 2 Active Inactive Electronic Signature(s) Signed: 07/27/2021 4:47:34 PM By: Shawn Stall RN, BSN Entered By: Shawn Stall on 07/27/2021 10:52:07 -------------------------------------------------------------------------------- Pain Assessment Details Patient Name: Date of Service: Carly LE, [name] 07/27/2021 9:30 A M Medical Record Number: [id number] Patient Account Number: [id number] Date of Birth/Sex: Treating RN: June 29, 1935 (86 y.o. Arta Silence Primary Care Diantha Paxson: [name] Other Clinician: Referring Lindie Roberson: Treating Blane Worthington/Extender: Donato Heinz in Treatment: 2 Active Problems Location of Pain Severity and Description of Pain Patient Has Paino No Site Locations Rate the pain. Current Pain Level: 0 Pain Management and Medication Current Pain Management: Medication: No Cold Application: No Rest: No Massage: No Activity: No T.E.N.S.: No Heat Application: No Leg drop or elevation: No Is the Current Pain Management Adequate: Adequate How does your wound impact your activities of daily livingo Sleep: No Bathing: No Appetite: No Relationship With Others: No Bladder Continence: No Emotions: No Bowel Continence: No Work: No Toileting: No Drive: No Dressing: No Hobbies: No Psychologist, prison and probation services) Signed: 07/27/2021 4:47:34 PM By: Shawn Stall RN, BSN Entered By: Shawn Stall on 07/27/2021 09:53:24 -------------------------------------------------------------------------------- Patient/Caregiver Education Details Patient Name: Date of Service: Mel Almond, MA Judson Roch 2/6/2023andnbsp9:30 A M Medical Record Number: [id number] Patient Account Number: [id number] Date of Birth/Gender: Treating RN: 1936/01/30 (86 y.o. Arta Silence Primary Care Physician: [name] Other Clinician: Referring Physician: Treating Physician/Extender: Donato Heinz in Treatment: 2 Education Assessment Education Provided  To: Patient Education Topics Provided Wound/Skin Impairment: Handouts: Skin Care Do's and Dont's Methods: Explain/Verbal Responses: Reinforcements needed Electronic Signature(s) Signed: 07/27/2021 4:47:34 PM By: Shawn Stall RN, BSN Entered By: Shawn Stall on 07/27/2021 09:59:40 -------------------------------------------------------------------------------- Wound Assessment Details Patient Name: Date of Service: Carly LE, [name] 07/27/2021 9:30 A M Medical Record Number: [id number] Patient Account Number: [id number] Date of Birth/Sex: Treating RN: 1936-05-19 (86 y.o. [name], [name] Primary Care Arcadia Gorgas: [name] Other Clinician: Referring Briella Hobday: Treating Janice Bodine/Extender: Donato Heinz in Treatment: 2 Wound Status Wound Number: 1 Primary Etiology: Venous Leg Ulcer Wound Location: Left, Lateral Lower Leg Wound Status: Healed - Epithelialized Wounding Event: Trauma Comorbid History: Anemia, Hypertension, Dementia Date Acquired: 02/19/2021 Weeks Of Treatment: 2 Clustered Wound: No Photos Wound Measurements Length: (cm) Width: (cm) Depth: (cm) Area: (cm) Volume: (cm) 0 % Reduction in Area: 100%  0 % Reduction in Volume: 100% 0 Epithelialization: Large (67-100%) 0 Tunneling: No 0 Undermining: No Wound Description Classification: Full Thickness Without Exposed Support Structures Wound Margin: Distinct, outline attached Exudate Amount: None Present Foul Odor After Cleansing: No Slough/Fibrino Yes Wound Bed Granulation Amount: None Present (0%) Exposed Structure Necrotic Amount: None Present (0%) Fascia Exposed: No Fat Layer (Subcutaneous Tissue) Exposed: No Tendon Exposed: No Muscle Exposed: No Joint Exposed: No Bone Exposed: No Electronic Signature(s) Signed: 07/27/2021 4:47:34 PM By: Shawn Stalleaton, Bobbi RN, BSN Entered By: Shawn Stalleaton, Bobbi on 07/27/2021  10:13:00 -------------------------------------------------------------------------------- Wound Assessment Details Patient Name: Date of Service: Carly LE, [name] 07/27/2021 9:30 A M Medical Record Number: [id number] Patient Account Number: [id number] Date of Birth/Sex: Treating RN: 1936/03/26 (86 y.o. Carly PickettF) Deaton, Carly KendallBobbi Primary Care Aryanna Shaver: Shirlean MylarWebb, Carol Other Clinician: Referring Diannie Willner: Treating Rennie Rouch/Extender: Donato HeinzHoffman, Jessica Webb, Carol Weeks in Treatment: 2 Wound Status Wound Number: 2 Primary Etiology: Trauma, Other Wound Location: Left, Anterior Lower Leg Wound Status: Open Wounding Event: Trauma Comorbid History: Anemia, Hypertension, Dementia Date Acquired: 06/26/2021 Weeks Of Treatment: 2 Clustered Wound: No Photos Wound Measurements Length: (cm) Width: (cm) Depth: (cm) Area: (cm) Volume: (cm) 0 % Reduction in Area: 100% 0 % Reduction in Volume: 100% 0 Epithelialization: Large (67-100%) 0 Tunneling: No 0 Undermining: No Wound Description Classification: Full Thickness Without Exposed Support Structures Wound Margin: Distinct, outline attached Exudate Amount: None Present Foul Odor After Cleansing: No Slough/Fibrino No Wound Bed Granulation Amount: None Present (0%) Exposed Structure Necrotic Amount: None Present (0%) Fascia Exposed: No Fat Layer (Subcutaneous Tissue) Exposed: No Tendon Exposed: No Muscle Exposed: No Joint Exposed: No Bone Exposed: No Electronic Signature(s) Signed: 07/27/2021 4:47:34 PM By: Shawn Stalleaton, Bobbi RN, BSN Entered By: Shawn Stalleaton, Bobbi on 07/27/2021 09:59:11 -------------------------------------------------------------------------------- Vitals Details Patient Name: Date of Service: Carly LE, [name] 07/27/2021 9:30 A M Medical Record Number: [id number] Patient Account Number: [id number] Date of Birth/Sex: Treating RN: 1936/03/26 (86 y.o. Carly PickettF) Deaton, Carly KendallBobbi Primary Care Isaid Salvia: Shirlean MylarWebb, Carol Other Clinician: Referring  Owens Hara: Treating Izsak Meir/Extender: Donato HeinzHoffman, Jessica Webb, Carol Weeks in Treatment: 2 Vital Signs Time Taken: 09:50 Temperature (F): 97.7 Height (in): 65 Pulse (bpm): 108 Weight (lbs): 169 Respiratory Rate (breaths/min): 20 Body Mass Index (BMI): 28.1 Blood Pressure (mmHg): 112/73 Reference Range: 80 - 120 mg / dl Electronic Signature(s) Signed: 07/27/2021 4:47:34 PM By: Shawn Stalleaton, Bobbi RN, BSN Entered By: Shawn Stalleaton, Bobbi on 07/27/2021 09:53:08

## 2021-07-27 NOTE — Progress Notes (Signed)
[name], [name] ([id number]) Visit Report for 07/27/2021 Chief Complaint Document Details Patient Name: Date of Service: [place], [place]. 07/27/2021 9:30 A M Medical Record Number: [id number] Patient Account Number: [id number] Date of Birth/Sex: Treating RN: 1936/02/05 (86 y.o. [name] Primary Care Provider: Maurice Small Other Clinician: Referring Provider: Treating Provider/Extender: Karin Lieu in Treatment: 2 Information Obtained from: Patient Chief Complaint Left lower extremity wounds Electronic Signature(s) Signed: 07/27/2021 11:17:40 AM By: Kalman Shan DO Entered By: Kalman Shan on 07/27/2021 11:00:58 -------------------------------------------------------------------------------- HPI Details Patient Name: Date of Service: [name], [name] 07/27/2021 9:30 A M Medical Record Number: [id number] Patient Account Number: [id number] Date of Birth/Sex: Treating RN: Apr 25, 1936 (86 y.o. [name] Primary Care Provider: Maurice Small Other Clinician: Referring Provider: Treating Provider/Extender: Karin Lieu in Treatment: 2 History of Present Illness HPI Description: Admission 07/10/2021 [name] is an 86 year old female with a past medical history of CVA, hyperlipidemia, dementia and peripheral arterial disease that presents to the clinic for 2 wounds to her left lower extremity. The most distal wound occurred 4 months ago. Due to dementia it is unclear how the wound started. It has been healing over the past several months albeit slowly. She is currently using Xeroform to this area. She has another wound that occurred after a fall 2 weeks ago. She has also been using Xeroform to this area. She denies signs of infection. She denies drainage from the wound beds currently. 07/27/2021; patient presents for follow-up. Granddaughter is present and helps provide the history due to patient's dementia. She has been using  collagen to the wound bed up until a few days ago. She states that the wounds had closed. She put some Xeroform to keep the area from drying out. She used the steroid cream with improvement in skin irritation. She currently has no issues or complaints today. She denies signs of infection. Electronic Signature(s) Signed: 07/27/2021 11:17:40 AM By: Kalman Shan DO Entered By: Kalman Shan on 07/27/2021 11:04:44 -------------------------------------------------------------------------------- Physical Exam Details Patient Name: Date of Service: [name], [name] 07/27/2021 9:30 A M Medical Record Number: [id number] Patient Account Number: [id number] Date of Birth/Sex: Treating RN: 01-17-1936 (86 y.o. [name] Primary Care Provider: Maurice Small Other Clinician: Referring Provider: Treating Provider/Extender: Karin Lieu in Treatment: 2 Constitutional respirations regular, non-labored and within target range for patient.. Cardiovascular 2+ dorsalis pedis/posterior tibialis pulses. Psychiatric pleasant and cooperative. Notes Left lower extremity: No open wounds present. Minimal flaking erythematous patches. Improvement from last clinic visit. Electronic Signature(s) Signed: 07/27/2021 11:17:40 AM By: Kalman Shan DO Entered By: Kalman Shan on 07/27/2021 11:05:25 -------------------------------------------------------------------------------- Physician Orders Details Patient Name: Date of Service: [name], [name] 07/27/2021 9:30 A M Medical Record Number: [id number] Patient Account Number: [id number] Date of Birth/Sex: Treating RN: 1935/11/18 (86 y.o. Helene Shoe, Meta.Reding Primary Care Provider: Maurice Small Other Clinician: Referring Provider: Treating Provider/Extender: Karin Lieu in Treatment: 2 Verbal / Phone Orders: No Diagnosis Coding ICD-10 Coding Code Description [phone] Non-pressure chronic ulcer of other part of  right lower leg with fat layer exposed F03.918 Unspecified dementia, unspecified severity, with other behavioral disturbance I63.9 Cerebral infarction, unspecified E78.2 Mixed hyperlipidemia Discharge From [facility] Discharge from [facility] - May continue to use xeroform and TCA cream to closed areas as needed for at least a week. Call if any future wound care needs. [facility] for wound  care. - [facility] from wound care standdpoint. Electronic Signature(s) Signed: 07/27/2021 11:17:40 AM By: Kalman Shan DO Entered By: Kalman Shan on 07/27/2021 11:05:38 -------------------------------------------------------------------------------- Problem List Details Patient Name: Date of Service: [name], [name] 07/27/2021 9:30 A M Medical Record Number: [id number] Patient Account Number: [id number] Date of Birth/Sex: Treating RN: 22-Dec-1935 (86 y.o. [name] Primary Care Provider: Other Clinician: Maurice Small Referring Provider: Treating Provider/Extender: Karin Lieu in Treatment: 2 Active Problems ICD-10 Encounter Code Description Active Date MDM Diagnosis [phone] Non-pressure chronic ulcer of other part of right lower leg with fat layer 07/10/2021 No Yes exposed F03.918 Unspecified dementia, unspecified severity, with other behavioral disturbance 07/10/2021 No Yes I63.9 Cerebral infarction, unspecified 07/10/2021 No Yes E78.2 Mixed hyperlipidemia 07/10/2021 No Yes Inactive Problems Resolved Problems Electronic Signature(s) Signed: 07/27/2021 11:17:40 AM By: Kalman Shan DO Entered By: Kalman Shan on 07/27/2021 11:00:33 -------------------------------------------------------------------------------- Progress Note Details Patient Name: Date of Service: [name], [name] 07/27/2021 9:30 A M Medical Record Number: [id number] Patient Account Number: [id number] Date of Birth/Sex: Treating RN: [birth year] (86 y.o.  [name] Primary Care Provider: Maurice Small Other Clinician: Referring Provider: Treating Provider/Extender: Karin Lieu in Treatment: 2 Subjective Chief Complaint Information obtained from Patient Left lower extremity wounds History of Present Illness (HPI) Admission 07/10/2021 [name] is an 86 year old female with a past medical history of CVA, hyperlipidemia, dementia and peripheral arterial disease that presents to the clinic for 2 wounds to her left lower extremity. The most distal wound occurred 4 months ago. Due to dementia it is unclear how the wound started. It has been healing over the past several months albeit slowly. She is currently using Xeroform to this area. She has another wound that occurred after a fall 2 weeks ago. She has also been using Xeroform to this area. She denies signs of infection. She denies drainage from the wound beds currently. 07/27/2021; patient presents for follow-up. Granddaughter is present and helps provide the history due to patient's dementia. She has been using collagen to the wound bed up until a few days ago. She states that the wounds had closed. She put some Xeroform to keep the area from drying out. She used the steroid cream with improvement in skin irritation. She currently has no issues or complaints today. She denies signs of infection. Patient History Information obtained from Patient, Chart. Family History Unknown History. Social History Never smoker, Marital Status - Single, Alcohol Use - Never, Drug Use - No History, Caffeine Use - Rarely - coffee. Medical History Eyes Denies history of Cataracts, Glaucoma, Optic Neuritis Ear/Nose/Mouth/Throat Denies history of Chronic sinus problems/congestion, Middle ear problems Hematologic/Lymphatic Patient has history of Anemia Denies history of Hemophilia, Human Immunodeficiency Virus, Lymphedema, Sickle Cell Disease Respiratory Denies history of  Aspiration, Asthma, Chronic Obstructive Pulmonary Disease (COPD), Pneumothorax, Sleep Apnea, Tuberculosis Cardiovascular Patient has history of Hypertension Denies history of Angina, Arrhythmia, Congestive Heart Failure, Coronary Artery Disease, Deep Vein Thrombosis, Hypotension, Myocardial Infarction, Peripheral Arterial Disease, Peripheral Venous Disease, Phlebitis, Vasculitis Gastrointestinal Denies history of Cirrhosis , Colitis, Crohnoos, Hepatitis A, Hepatitis B, Hepatitis C Endocrine Denies history of Type I Diabetes, Type II Diabetes Genitourinary Denies history of End Stage Renal Disease Immunological Denies history of Lupus Erythematosus, Raynaudoos, Scleroderma Integumentary (Skin) Denies history of History of Burn Musculoskeletal Denies history of Gout, Rheumatoid Arthritis, Osteoarthritis, Osteomyelitis Neurologic Patient has history of Dementia Denies history of Neuropathy, Quadriplegia, Paraplegia, Seizure Disorder Hospitalization/Surgery History - cholecystectomy. -  left shoulder surgery. - L TH[facility]-. Medical A Surgical History Notes nd Cardiovascular hyperlipidemia Gastrointestinal gout Endocrine prediabetes; hypothyroidism Objective Constitutional respirations regular, non-labored and within target range for patient.. Vitals Time Taken: 9:50 AM, Height: 65 in, Weight: 169 lbs, BMI: 28.1, Temperature: 97.7 F, Pulse: 108 bpm, Respiratory Rate: 20 breaths/min, Blood Pressure: 112/73 mmHg. Cardiovascular 2+ dorsalis pedis/posterior tibialis pulses. Psychiatric pleasant and cooperative. General Notes: Left lower extremity: No open wounds present. Minimal flaking erythematous patches. Improvement from last clinic visit. Integumentary (Hair, Skin) Wound #1 status is Healed - Epithelialized. Original cause of wound was Trauma. The date acquired was: 02/19/2021. The wound has been in treatment 2 weeks. The wound is located on the Left,Lateral Lower Leg. The wound  measures 0cm length x 0cm width x 0cm depth; 0cm^2 area and 0cm^3 volume. There is no tunneling or undermining noted. There is a none present amount of drainage noted. The wound margin is distinct with the outline attached to the wound base. There is no granulation within the wound bed. There is no necrotic tissue within the wound bed. Wound #2 status is Open. Original cause of wound was Trauma. The date acquired was: 06/26/2021. The wound has been in treatment 2 weeks. The wound is located on the Left,Anterior Lower Leg. The wound measures 0cm length x 0cm width x 0cm depth; 0cm^2 area and 0cm^3 volume. There is no tunneling or undermining noted. There is a none present amount of drainage noted. The wound margin is distinct with the outline attached to the wound base. There is no granulation within the wound bed. There is no necrotic tissue within the wound bed. Assessment Active Problems ICD-10 Non-pressure chronic ulcer of other part of right lower leg with fat layer exposed Unspecified dementia, unspecified severity, with other behavioral disturbance Cerebral infarction, unspecified Mixed hyperlipidemia Patient has done well with collagen. Her wounds have healed. She also did well with the triamcinolone cream. She can continue Xeroform to the closed areas and TCA as needed for the dry patchy areas for the next 1 to 2 weeks. She knows to call with any questions or concerns. She can follow-up as needed. Plan Discharge From [facility]: Discharge from Wound Care Center - May continue to use xeroform and TCA cream to closed areas as needed for at least a week. Call if any future wound care needs. Home Health: Discontinue home health for wound care. - [facility] from wound care standdpoint. 1. Discharge from wound care center due to close wound 2. Follow-up as needed Electronic Signature(s) Signed: 07/27/2021 11:17:40 AM By: Geralyn Corwin DO Entered By: Geralyn Corwin on 07/27/2021  11:17:07 -------------------------------------------------------------------------------- HxROS Details Patient Name: Date of Service: [name], [name] 07/27/2021 9:30 A M Medical Record Number: [id number] Patient Account Number: [id number] Date of Birth/Sex: Treating RN: [birth year] (86 y.o. Roel Cluck Primary Care Provider: [name] Other Clinician: Referring Provider: Treating Provider/Extender: Donato Heinz in Treatment: 2 Information Obtained From Patient Chart Eyes Medical History: Negative for: Cataracts; Glaucoma; Optic Neuritis Ear/Nose/Mouth/Throat Medical History: Negative for: Chronic sinus problems/congestion; Middle ear problems Hematologic/Lymphatic Medical History: Positive for: Anemia Negative for: Hemophilia; Human Immunodeficiency Virus; Lymphedema; Sickle Cell Disease Respiratory Medical History: Negative for: Aspiration; Asthma; Chronic Obstructive Pulmonary Disease (COPD); Pneumothorax; Sleep Apnea; Tuberculosis Cardiovascular Medical History: Positive for: Hypertension Negative for: Angina; Arrhythmia; Congestive Heart Failure; Coronary Artery Disease; Deep Vein Thrombosis; Hypotension; Myocardial Infarction; Peripheral Arterial Disease; Peripheral Venous Disease; Phlebitis; Vasculitis Past Medical History Notes: hyperlipidemia Gastrointestinal Medical  History: Negative for: Cirrhosis ; Colitis; Crohns; Hepatitis A; Hepatitis B; Hepatitis C Past Medical History Notes: gout Endocrine Medical History: Negative for: Type I Diabetes; Type II Diabetes Past Medical History Notes: prediabetes; hypothyroidism Genitourinary Medical History: Negative for: End Stage Renal Disease Immunological Medical History: Negative for: Lupus Erythematosus; Raynauds; Scleroderma Integumentary (Skin) Medical History: Negative for: History of Burn Musculoskeletal Medical History: Negative for: Gout; Rheumatoid Arthritis;  Osteoarthritis; Osteomyelitis Neurologic Medical History: Positive for: Dementia Negative for: Neuropathy; Quadriplegia; Paraplegia; Seizure Disorder Immunizations Pneumococcal Vaccine: Received Pneumococcal Vaccination: Yes Received Pneumococcal Vaccination On or After 60th Birthday: Yes Implantable Devices No devices added Hospitalization / Surgery History Type of Hospitalization/Surgery cholecystectomy left shoulder surgery L THAAA- Family and Social History Unknown History: Yes; Never smoker; Marital Status - Single; Alcohol Use: Never; Drug Use: No History; Caffeine Use: Rarely - coffee; Financial Concerns: No; Food, Clothing or Shelter Needs: No; Support System Lacking: No; Transportation Concerns: No Electronic Signature(s) Signed: 07/27/2021 11:17:40 AM By: Kalman Shan DO Signed: 07/27/2021 4:43:35 PM By: Lorrin Jackson Entered By: Kalman Shan on 07/27/2021 11:04:51 -------------------------------------------------------------------------------- SuperBill Details Patient Name: Date of Service: [name], [name] 07/27/2021 Medical Record Number: [id number] Patient Account Number: [id number] Date of Birth/Sex: Treating RN: [birth year] (86 y.o. Helene Shoe, Tammi Klippel Primary Care Provider: Maurice Small Other Clinician: Referring Provider: Treating Provider/Extender: Karin Lieu in Treatment: 2 Diagnosis Coding ICD-10 Codes Code Description [phone] Non-pressure chronic ulcer of other part of right lower leg with fat layer exposed F03.918 Unspecified dementia, unspecified severity, with other behavioral disturbance I63.9 Cerebral infarction, unspecified E78.2 Mixed hyperlipidemia Facility Procedures CPT4 Code: [id number] Description: 99213 - WOUND CARE VISIT-LEV 3 EST PT Modifier: Quantity: 1 Physician Procedures : CPT4 Code Description Modifier [id number] - WC PHYS LEVEL 3 - EST PT ICD-10 Diagnosis Description [id number] Non-pressure chronic ulcer  of other part of right lower leg with fat layer exposed F03.918 Unspecified dementia, unspecified severity, with  other behavioral disturbance I63.9 Cerebral infarction, unspecified E78.2 Mixed hyperlipidemia Quantity: 1 Electronic Signature(s) Signed: 07/27/2021 11:17:40 AM By: Kalman Shan DO Entered By: Kalman Shan on 07/27/2021 11:17:21

## 2021-07-28 ENCOUNTER — Telehealth (INDEPENDENT_AMBULATORY_CARE_PROVIDER_SITE_OTHER): Payer: Medicare PPO | Admitting: Physician Assistant

## 2021-07-28 DIAGNOSIS — F03918 Unspecified dementia, unspecified severity, with other behavioral disturbance: Secondary | ICD-10-CM

## 2021-07-28 MED ORDER — GABAPENTIN 100 MG PO CAPS
ORAL_CAPSULE | ORAL | 3 refills | Status: DC
Start: 2021-07-28 — End: 2022-01-05

## 2021-07-28 MED ORDER — OLANZAPINE 10 MG PO TABS: ORAL_TABLET | ORAL | 1 refills | Status: DC

## 2021-07-28 MED ORDER — SERTRALINE HCL 50 MG PO TABS
50.0000 mg | ORAL_TABLET | Freq: Every day | ORAL | 3 refills | Status: DC
Start: 2021-07-28 — End: 2022-01-25

## 2021-07-28 MED ORDER — MEMANTINE HCL 5 MG PO TABS: 5.0000 mg | ORAL_TABLET | Freq: Every morning | ORAL | 3 refills | Status: DC

## 2021-07-28 MED ORDER — TRAZODONE HCL 50 MG PO TABS: ORAL_TABLET | ORAL | 3 refills | Status: DC

## 2021-07-28 NOTE — Patient Instructions (Signed)
Good to see you! Continue all medications. Follow-up in 6 months, call for any changes. 

## 2021-07-28 NOTE — Progress Notes (Signed)
Virtual Visit via Video Note The purpose of this virtual visit is to provide medical care while limiting exposure to the novel coronavirus.    Consent was obtained for video visit:  yes Answered questions that patient had about telehealth interaction:  yes I discussed the limitations, risks, security and privacy concerns of performing an evaluation and management service by telemedicine. I also discussed with the patient that there may be a patient responsible charge related to this service. The patient expressed understanding and agreed to proceed.  Pt location: Home Physician Location: office Name of referring provider:  Maurice Small, MD I connected with [name] at patients initiation/request on 07/28/2021 at 11:30 AM EST by video enabled telemedicine application and verified that I am speaking with the correct person using two identifiers. Pt MRN:  [id number] Pt DOB:  [birth year] Video Participants:  [name];  [name] (granddaughter)   History of Present Illness:  The patient had a virtual video visit on 07/28/2021. She was last seen via virtual visit on 12/31/20 for dementia with behavioral disturbance. She is again accompanied by her granddaughter [name] who helps supplement the history today. Since her last visit, she reports feeling well, without complaints.  Sleep is good, denies REM behavior, hallucinations or paranoia.[name] reports she is doing very, very good on her current regimen. [name] manages medications, finances and driving.  She may need some assistance with dressing and bathing. No further significant behavioral issues. She denies any headaches, dizziness, vision changes, focal numbness/tingling, no falls. She is on Memantine 5mg  daily, Gabapentin 100mg  in AM, 200mg  in PM, olanzapine 10mg  1/2 tab in AM, 1 tab qhs, Sertraline 50mg  daily, and Trazodone 100mg  qhs without side effects.   History on Initial Assessment 01/01/2020: This is a pleasant 86 year old  right-handed woman with a history of hypertension, hyperlipidemia, prior stroke with no residual deficits, presenting for evaluation of auditory hallucinations and memory loss. She states her memory "comes and goes." She lives with her granddaughter [name] and daughter-in-law [name]. [name] started noticing memory changes the last couple of years where she would be repeating herself. Recently, memory has worsened, she had 2 appointments in one day and did not remember the afternoon appointment. They have had to write down notes more. She manages her own medications and [name] has not noticed any issues with forgetting medications. She used to live with her husband then he passed away and she lived alone in [place] for 9 months, before she moved in with [facility] 2 years ago. She was not missing any bill payments while alone, bills were switched to autopay 2 years ago. She continues to drive around [place] and denies getting lost. She was getting lost in [place], but they feel it was due to being in an unfamiliar place. [name] started noticing auditory hallucinations while she was still living alone, she would say something was not right with the air, or the doors, so [name] moved her. She was saying the she would hear cars at night or someone trying to break in, barricading her door. She would say some boys were drinking beside her condo or she had mice in the condo and put traps out but never caught any. Since moving in with [name] 2 years ago, she continued to have hallucinations, mostly at night initially, saying something was in the bed with her. Hallucinations have progressively worsened the past few months, also occurring in the daytime. She does not remember what she hears at night, [name]  would remind her that she tells [name] she hears her granddaughter calling her "[name]" at night or during the day when the granddaughter is not home. She denies any visual hallucinations, however [name] reminds her the other  day she thought she saw someone getting in the window, she heard things outside and said they were packing things into delivery trucks. She saw arms lifting things up, loading trucks up. She saw a shadow come back the other day on the 2nd level window. She would be afraid he would get a hold of the family.  She would be up all night, knocking on their doors telling them about the hallucinations. Melatonin did not help. Family brought her to the ER a week ago, CBC, CMP, urinalysis unremarkable except for anemia. I personally reviewed head CT without contrast which did not show any acute changes, there was diffuse cerebral and cerebellar atrophy, moderately extensive chronic microvascular disease. They were instructed to give Tylenol PM to help her sleep at night, she has been taking 2 tabs qhs and has been sleeping all night with this since then. No REM behavior disorder noted. [name] denies any prior psychiatric history. No hygiene concerns, she is able to bathe and dress independently. Her mother and father had Alzheimer's disease. No history of significant head injuries. She has not had any alcohol in a while.   She denies any headaches, dizziness, diplopia, dysarthria/dysphagia, neck/back pain, focal numbness/tingling/weakness, bowel/bladder dysfunction, anosmia, or tremors. Mood comes and goes, "but not bad." Her last fall was in October 2020 when she tripped. She is scheduled for left hip replacement on 01/28/20.   Update 02/04/2020: Since her last visit, she was brought to the ER on 01/04/20 for worsening behaviors with auditory and visual hallucinations about babies crying while accusing family of killing the crying babies. She would run out of the house at all times at night. The police was in their home when she drove off without her phone. An IVC had to be issued so police could get her. She was brought to the ER where bloodwork was unremarkable. UA showed moderate leukocytes, 6-10 WBC, culture negative.  UDS negative. She had an MRI brain on 01/05/20 which did not show any acute changes. There was diffuse cerebral atrophy, moderate chronic microvascular disease. She was admitted to [facility] health from 7/20 to 8/4. She was started on gabapentin 150mg  every 8 hours (250mg /42mL taking 3 mLs TID), Memantine 5mg  daily, olanzapine 10mg  qhs, Sertraline 50mg  daily, and Trazodone 50mg  qhs. Donepezil was stopped since she had significant worsening the day she started it. She has had significant improvement since hospital discharge. No further hallucinations or delusions.    Current Outpatient Medications on File Prior to Visit  Medication Sig Dispense Refill   aspirin EC 81 MG tablet Take 1 tablet (81 mg total) by mouth 2 (two) times daily. (Patient taking differently: Take 81 mg by mouth once.) 60 tablet 0   Cholecalciferol (VITAMIN D3) 25 MCG (1000 UT) CAPS 1 capsule     gabapentin (NEURONTIN) 100 MG capsule Take 1 cap in AM, 2 caps in PM 270 capsule 3   levothyroxine (SYNTHROID) 75 MCG tablet Take by mouth.     lisinopril (ZESTRIL) 5 MG tablet Take 5 mg by mouth in the morning.     memantine (NAMENDA) 5 MG tablet Take 1 tablet (5 mg total) by mouth in the morning. 90 tablet 3   metoprolol succinate (TOPROL-XL) 50 MG 24 hr tablet Take 25 mg by mouth  in the morning.     OLANZapine (ZYPREXA) 10 MG tablet TAKE 1/2 TABLET IN THE MORNING AND TAKE 1 TABLET AT BEDTIME 135 tablet 1   sertraline (ZOLOFT) 50 MG tablet Take 1 tablet (50 mg total) by mouth daily. 90 tablet 3   simvastatin (ZOCOR) 20 MG tablet Take 20 mg by mouth at bedtime.      tiZANidine (ZANAFLEX) 2 MG tablet Take 1 tablet (2 mg total) by mouth every 6 (six) hours as needed. 60 tablet 0   traZODone (DESYREL) 50 MG tablet TAKE 2 TABLETS AT BEDTIME 180 tablet 3   vitamin B-12 (CYANOCOBALAMIN) 1000 MCG tablet 1 tablet     levothyroxine (SYNTHROID) 50 MCG tablet Take 50 mcg by mouth daily before breakfast.     oxyCODONE-acetaminophen  (PERCOCET/ROXICET) 5-325 MG tablet Take 1 tablet by mouth every 4 (four) hours as needed for severe pain. (Patient not taking: Reported on 07/28/2021) 30 tablet 0   No current facility-administered medications on file prior to visit.     Observations/Objective:   GEN:  The patient appears stated age and is in NAD.  Neurological examination: Patient is awake, alert, oriented to person, place. Stated month is February.  No aphasia or dysarthria. Intact fluency and comprehension. Remote and recent memory impaired, 0/3 delayed recall. Cranial nerves: Extraocular movements intact with no nystagmus. No facial asymmetry. Hard of hearing. Motor: moves all extremities symmetrically, at least anti-gravity x 4. Gait: slow and cautious, no ataxia   Assessment and Plan:   This is a pleasant 86 yo RH woman with a history of hypertension, hyperlipidemia, prior stroke with no residual deficits, with likely Alzheimer's disease with behavioral changes. MRI brain no acute changes, there was diffuse cerebral atrophy, moderate chronic microvascular disease. Psychosis controlled on current regimen, continue olanzapine 10mg  1/2 tab in AM, 1 tab qhs, sertraline 50mg  daily, Trazodone 100mg  qhs, Memantine 5mg  daily, gabapentin 100mg  in AM, 200mg  qhs.  Refills have been written to pharmacy.  Continue 24/7 care. Follow-up in 6 months, call for any changes.    Follow Up Instructions:   -I discussed the assessment and treatment plan with the patient/granddaughter. The patient/granddaughter were provided an opportunity to ask questions and all were answered. The patient/granddaughter agreed with the plan and demonstrated an understanding of the instructions.   The patient/granddaughter were advised to call back or seek an in-person evaluation if the symptoms worsen or if the condition fails to improve as anticipated.     Sharene Butters, PA-C

## 2021-08-04 DIAGNOSIS — L89312 Pressure ulcer of right buttock, stage 2: Secondary | ICD-10-CM | POA: Diagnosis not present

## 2021-08-04 DIAGNOSIS — F0280 Dementia in other diseases classified elsewhere without behavioral disturbance: Secondary | ICD-10-CM | POA: Diagnosis not present

## 2021-08-04 DIAGNOSIS — F419 Anxiety disorder, unspecified: Secondary | ICD-10-CM | POA: Diagnosis not present

## 2021-08-04 DIAGNOSIS — L97822 Non-pressure chronic ulcer of other part of left lower leg with fat layer exposed: Secondary | ICD-10-CM | POA: Diagnosis not present

## 2021-08-04 DIAGNOSIS — E039 Hypothyroidism, unspecified: Secondary | ICD-10-CM | POA: Diagnosis not present

## 2021-08-04 DIAGNOSIS — I872 Venous insufficiency (chronic) (peripheral): Secondary | ICD-10-CM | POA: Diagnosis not present

## 2021-08-04 DIAGNOSIS — G4700 Insomnia, unspecified: Secondary | ICD-10-CM | POA: Diagnosis not present

## 2021-08-04 DIAGNOSIS — I10 Essential (primary) hypertension: Secondary | ICD-10-CM | POA: Diagnosis not present

## 2021-08-04 DIAGNOSIS — F32A Depression, unspecified: Secondary | ICD-10-CM | POA: Diagnosis not present

## 2021-08-10 DIAGNOSIS — I10 Essential (primary) hypertension: Secondary | ICD-10-CM | POA: Diagnosis not present

## 2021-08-10 DIAGNOSIS — G4700 Insomnia, unspecified: Secondary | ICD-10-CM | POA: Diagnosis not present

## 2021-08-10 DIAGNOSIS — E039 Hypothyroidism, unspecified: Secondary | ICD-10-CM | POA: Diagnosis not present

## 2021-08-10 DIAGNOSIS — F419 Anxiety disorder, unspecified: Secondary | ICD-10-CM | POA: Diagnosis not present

## 2021-08-10 DIAGNOSIS — L89312 Pressure ulcer of right buttock, stage 2: Secondary | ICD-10-CM | POA: Diagnosis not present

## 2021-08-10 DIAGNOSIS — L97822 Non-pressure chronic ulcer of other part of left lower leg with fat layer exposed: Secondary | ICD-10-CM | POA: Diagnosis not present

## 2021-08-10 DIAGNOSIS — F32A Depression, unspecified: Secondary | ICD-10-CM | POA: Diagnosis not present

## 2021-08-10 DIAGNOSIS — I872 Venous insufficiency (chronic) (peripheral): Secondary | ICD-10-CM | POA: Diagnosis not present

## 2021-08-10 DIAGNOSIS — F0280 Dementia in other diseases classified elsewhere without behavioral disturbance: Secondary | ICD-10-CM | POA: Diagnosis not present

## 2021-08-20 DIAGNOSIS — F0280 Dementia in other diseases classified elsewhere without behavioral disturbance: Secondary | ICD-10-CM | POA: Diagnosis not present

## 2021-08-20 DIAGNOSIS — I10 Essential (primary) hypertension: Secondary | ICD-10-CM | POA: Diagnosis not present

## 2021-08-20 DIAGNOSIS — G4700 Insomnia, unspecified: Secondary | ICD-10-CM | POA: Diagnosis not present

## 2021-08-20 DIAGNOSIS — F32A Depression, unspecified: Secondary | ICD-10-CM | POA: Diagnosis not present

## 2021-08-20 DIAGNOSIS — E039 Hypothyroidism, unspecified: Secondary | ICD-10-CM | POA: Diagnosis not present

## 2021-08-20 DIAGNOSIS — F419 Anxiety disorder, unspecified: Secondary | ICD-10-CM | POA: Diagnosis not present

## 2021-08-20 DIAGNOSIS — L89312 Pressure ulcer of right buttock, stage 2: Secondary | ICD-10-CM | POA: Diagnosis not present

## 2021-08-20 DIAGNOSIS — L97822 Non-pressure chronic ulcer of other part of left lower leg with fat layer exposed: Secondary | ICD-10-CM | POA: Diagnosis not present

## 2021-08-20 DIAGNOSIS — I872 Venous insufficiency (chronic) (peripheral): Secondary | ICD-10-CM | POA: Diagnosis not present

## 2021-08-22 ENCOUNTER — Other Ambulatory Visit: Payer: Self-pay | Admitting: Neurology

## 2021-08-27 ENCOUNTER — Emergency Department (HOSPITAL_COMMUNITY): Payer: Medicare PPO

## 2021-08-27 ENCOUNTER — Encounter (HOSPITAL_COMMUNITY): Payer: Self-pay | Admitting: Emergency Medicine

## 2021-08-27 ENCOUNTER — Inpatient Hospital Stay (HOSPITAL_COMMUNITY)
Admission: EM | Admit: 2021-08-27 | Discharge: 2021-08-31 | DRG: 175 | Disposition: A | Discharge status: Home Health | Payer: Medicare PPO | Attending: Internal Medicine | Admitting: Internal Medicine

## 2021-08-27 ENCOUNTER — Other Ambulatory Visit: Payer: Self-pay

## 2021-08-27 DIAGNOSIS — Z8249 Family history of ischemic heart disease and other diseases of the circulatory system: Secondary | ICD-10-CM | POA: Diagnosis not present

## 2021-08-27 DIAGNOSIS — I82451 Acute embolism and thrombosis of right peroneal vein: Secondary | ICD-10-CM | POA: Diagnosis present

## 2021-08-27 DIAGNOSIS — Z79890 Hormone replacement therapy (postmenopausal): Secondary | ICD-10-CM

## 2021-08-27 DIAGNOSIS — M1990 Unspecified osteoarthritis, unspecified site: Secondary | ICD-10-CM | POA: Diagnosis present

## 2021-08-27 DIAGNOSIS — E039 Hypothyroidism, unspecified: Secondary | ICD-10-CM | POA: Diagnosis present

## 2021-08-27 DIAGNOSIS — Z888 Allergy status to other drugs, medicaments and biological substances status: Secondary | ICD-10-CM | POA: Diagnosis not present

## 2021-08-27 DIAGNOSIS — I82401 Acute embolism and thrombosis of unspecified deep veins of right lower extremity: Secondary | ICD-10-CM | POA: Diagnosis not present

## 2021-08-27 DIAGNOSIS — I10 Essential (primary) hypertension: Secondary | ICD-10-CM | POA: Diagnosis present

## 2021-08-27 DIAGNOSIS — G9341 Metabolic encephalopathy: Secondary | ICD-10-CM | POA: Diagnosis present

## 2021-08-27 DIAGNOSIS — I959 Hypotension, unspecified: Principal | ICD-10-CM

## 2021-08-27 DIAGNOSIS — Z79899 Other long term (current) drug therapy: Secondary | ICD-10-CM

## 2021-08-27 DIAGNOSIS — R000 Tachycardia, unspecified: Secondary | ICD-10-CM | POA: Diagnosis not present

## 2021-08-27 DIAGNOSIS — F03918 Unspecified dementia, unspecified severity, with other behavioral disturbance: Secondary | ICD-10-CM | POA: Diagnosis present

## 2021-08-27 DIAGNOSIS — J9601 Acute respiratory failure with hypoxia: Secondary | ICD-10-CM | POA: Diagnosis present

## 2021-08-27 DIAGNOSIS — I2699 Other pulmonary embolism without acute cor pulmonale: Secondary | ICD-10-CM | POA: Diagnosis present

## 2021-08-27 DIAGNOSIS — R509 Fever, unspecified: Secondary | ICD-10-CM | POA: Diagnosis not present

## 2021-08-27 DIAGNOSIS — Z8673 Personal history of transient ischemic attack (TIA), and cerebral infarction without residual deficits: Secondary | ICD-10-CM

## 2021-08-27 DIAGNOSIS — Z90710 Acquired absence of both cervix and uterus: Secondary | ICD-10-CM

## 2021-08-27 DIAGNOSIS — R7303 Prediabetes: Secondary | ICD-10-CM | POA: Diagnosis present

## 2021-08-27 DIAGNOSIS — F0393 Unspecified dementia, unspecified severity, with mood disturbance: Secondary | ICD-10-CM | POA: Diagnosis present

## 2021-08-27 DIAGNOSIS — R404 Transient alteration of awareness: Secondary | ICD-10-CM | POA: Diagnosis not present

## 2021-08-27 DIAGNOSIS — Z7982 Long term (current) use of aspirin: Secondary | ICD-10-CM

## 2021-08-27 DIAGNOSIS — Z96642 Presence of left artificial hip joint: Secondary | ICD-10-CM | POA: Diagnosis present

## 2021-08-27 DIAGNOSIS — F32A Depression, unspecified: Secondary | ICD-10-CM | POA: Diagnosis present

## 2021-08-27 DIAGNOSIS — E782 Mixed hyperlipidemia: Secondary | ICD-10-CM | POA: Diagnosis present

## 2021-08-27 DIAGNOSIS — F419 Anxiety disorder, unspecified: Secondary | ICD-10-CM | POA: Diagnosis present

## 2021-08-27 DIAGNOSIS — Z20822 Contact with and (suspected) exposure to covid-19: Secondary | ICD-10-CM | POA: Diagnosis present

## 2021-08-27 DIAGNOSIS — R4182 Altered mental status, unspecified: Secondary | ICD-10-CM | POA: Diagnosis not present

## 2021-08-27 DIAGNOSIS — A419 Sepsis, unspecified organism: Secondary | ICD-10-CM | POA: Diagnosis present

## 2021-08-27 DIAGNOSIS — E079 Disorder of thyroid, unspecified: Secondary | ICD-10-CM | POA: Diagnosis present

## 2021-08-27 DIAGNOSIS — J189 Pneumonia, unspecified organism: Secondary | ICD-10-CM | POA: Diagnosis present

## 2021-08-27 DIAGNOSIS — I2609 Other pulmonary embolism with acute cor pulmonale: Secondary | ICD-10-CM | POA: Diagnosis not present

## 2021-08-27 DIAGNOSIS — R059 Cough, unspecified: Secondary | ICD-10-CM | POA: Diagnosis present

## 2021-08-27 LAB — COMPREHENSIVE METABOLIC PANEL
ALT: 16 U/L (ref 0–44)
AST: 23 U/L (ref 15–41)
Albumin: 3.5 g/dL (ref 3.5–5.0)
Alkaline Phosphatase: 46 U/L (ref 38–126)
Anion gap: 10 (ref 5–15)
BUN: 28 mg/dL — ABNORMAL HIGH (ref 8–23)
CO2: 23 mmol/L (ref 22–32)
Calcium: 8.5 mg/dL — ABNORMAL LOW (ref 8.9–10.3)
Chloride: 102 mmol/L (ref 98–111)
Creatinine, Ser: 1.09 mg/dL — ABNORMAL HIGH (ref 0.44–1.00)
GFR, Estimated: 50 mL/min — ABNORMAL LOW (ref >60)
Glucose, Bld: 112 mg/dL — ABNORMAL HIGH (ref 70–99)
Potassium: 4.3 mmol/L (ref 3.5–5.1)
Sodium: 135 mmol/L (ref 135–145)
Total Bilirubin: 0.6 mg/dL (ref 0.3–1.2)
Total Protein: 6.4 g/dL — ABNORMAL LOW (ref 6.5–8.1)

## 2021-08-27 LAB — CBC WITH DIFFERENTIAL/PLATELET
Abs Immature Granulocytes: 0.01 10*3/uL (ref 0.00–0.07)
Basophils Absolute: 0 10*3/uL (ref 0.0–0.1)
Basophils Relative: 0 %
Eosinophils Absolute: 0 10*3/uL (ref 0.0–0.5)
Eosinophils Relative: 0 %
HCT: 37.3 % (ref 36.0–46.0)
Hemoglobin: 11.3 g/dL — ABNORMAL LOW (ref 12.0–15.0)
Immature Granulocytes: 0 %
Lymphocytes Relative: 7 %
Lymphs Abs: 0.5 10*3/uL — ABNORMAL LOW (ref 0.7–4.0)
MCH: 27 pg (ref 26.0–34.0)
MCHC: 30.3 g/dL (ref 30.0–36.0)
MCV: 89.2 fL (ref 80.0–100.0)
Monocytes Absolute: 0.4 10*3/uL (ref 0.1–1.0)
Monocytes Relative: 7 %
Neutro Abs: 5.3 10*3/uL (ref 1.7–7.7)
Neutrophils Relative %: 86 %
Platelets: 163 10*3/uL (ref 150–400)
RBC: 4.18 MIL/uL (ref 3.87–5.11)
RDW: 15.2 % (ref 11.5–15.5)
WBC: 6.2 10*3/uL (ref 4.0–10.5)
nRBC: 0 % (ref 0.0–0.2)

## 2021-08-27 LAB — URINALYSIS, ROUTINE W REFLEX MICROSCOPIC
Bilirubin Urine: NEGATIVE
Glucose, UA: NEGATIVE mg/dL
Hgb urine dipstick: NEGATIVE
Ketones, ur: NEGATIVE mg/dL
Leukocytes,Ua: NEGATIVE
Nitrite: NEGATIVE
Protein, ur: NEGATIVE mg/dL
Specific Gravity, Urine: 1.017 (ref 1.005–1.030)
pH: 5 (ref 5.0–8.0)

## 2021-08-27 LAB — I-STAT VENOUS BLOOD GAS, ED
Acid-base deficit: 1 mmol/L (ref 0.0–2.0)
Bicarbonate: 23.7 mmol/L (ref 20.0–28.0)
Calcium, Ion: 1.05 mmol/L — ABNORMAL LOW (ref 1.15–1.40)
HCT: 35 % — ABNORMAL LOW (ref 36.0–46.0)
Hemoglobin: 11.9 g/dL — ABNORMAL LOW (ref 12.0–15.0)
O2 Saturation: 88 %
Potassium: 4.3 mmol/L (ref 3.5–5.1)
Sodium: 135 mmol/L (ref 135–145)
TCO2: 25 mmol/L (ref 22–32)
pCO2, Ven: 37.2 mmHg — ABNORMAL LOW (ref 44–60)
pH, Ven: 7.412 (ref 7.25–7.43)
pO2, Ven: 53 mmHg — ABNORMAL HIGH (ref 32–45)

## 2021-08-27 LAB — RESP PANEL BY RT-PCR (FLU A&B, COVID) ARPGX2
Influenza A by PCR: NEGATIVE
Influenza B by PCR: NEGATIVE
SARS Coronavirus 2 by RT PCR: NEGATIVE

## 2021-08-27 LAB — BRAIN NATRIURETIC PEPTIDE: B Natriuretic Peptide: 38.9 pg/mL (ref 0.0–100.0)

## 2021-08-27 LAB — TROPONIN I (HIGH SENSITIVITY)
Troponin I (High Sensitivity): 25 ng/L — ABNORMAL HIGH (ref <18)
Troponin I (High Sensitivity): 29 ng/L — ABNORMAL HIGH (ref <18)

## 2021-08-27 LAB — LACTIC ACID, PLASMA: Lactic Acid, Venous: 0.8 mmol/L (ref 0.5–1.9)

## 2021-08-27 LAB — CBG MONITORING, ED: Glucose-Capillary: 105 mg/dL — ABNORMAL HIGH (ref 70–99)

## 2021-08-27 MED ORDER — SODIUM CHLORIDE 0.9 % IV SOLN: Freq: Once | INTRAVENOUS | Status: AC

## 2021-08-27 MED ORDER — SODIUM CHLORIDE 0.9 % IV BOLUS
1000.0000 mL | Freq: Once | INTRAVENOUS | Status: AC
  Administered 2021-08-27: 21:00:00 1000 mL via INTRAVENOUS

## 2021-08-27 MED ORDER — SODIUM CHLORIDE 0.9 % IV BOLUS
500.0000 mL | Freq: Once | INTRAVENOUS | Status: AC
  Administered 2021-08-27: 18:00:00 500 mL via INTRAVENOUS

## 2021-08-27 MED ORDER — ACETAMINOPHEN 500 MG PO TABS
1000.0000 mg | ORAL_TABLET | Freq: Once | ORAL | Status: AC
  Administered 2021-08-27: 19:00:00 1000 mg via ORAL
  Filled 2021-08-27: qty 2

## 2021-08-27 NOTE — ED Triage Notes (Signed)
Patient BIB [facility] from home. Complaint of cough for a few days, decline in mental status today. Pt with dementia but family states not today not at baseline. VSS.  ?

## 2021-08-27 NOTE — ED Provider Notes (Signed)
[facility]   CSN: [id number] Arrival date & time: 08/27/21  1641     History  Chief Complaint  Patient presents with   Altered Mental Status    [name] is a 86 y.o. female.  HPI  86 year old female with past medical history of dementia, anxiety, HTN, previous CVA presents to the emergency department by EMS for concern of change in mental status.  No family at bedside initially.  Patient is oriented to herself but otherwise pleasantly confused and noncontributory towards history at this time.  EMS reports that the patient had a slight decline in mental status today with possible cough.  Level 5 caveat due to dementia.  Home Medications Prior to Admission medications   Medication Sig Start Date End Date Taking? Authorizing Provider  aspirin EC 81 MG tablet Take 1 tablet (81 mg total) by mouth 2 (two) times daily. Patient taking differently: Take 81 mg by mouth once. 10/22/20   Allena KatzPhillips, Eric K, PA-C  Cholecalciferol (VITAMIN D3) 25 MCG (1000 UT) CAPS 1 capsule 05/29/21   [provider]  gabapentin (NEURONTIN) 100 MG capsule Take 1 cap in AM, 2 caps in PM 07/28/21   Wertman, Sung AmabileSara E, PA-C  levothyroxine (SYNTHROID) 50 MCG tablet Take 50 mcg by mouth daily before breakfast.    [provider]  levothyroxine (SYNTHROID) 75 MCG tablet Take by mouth. 05/29/21   [provider]  lisinopril (ZESTRIL) 5 MG tablet Take 5 mg by mouth in the morning.    [provider]  memantine (NAMENDA) 5 MG tablet Take 1 tablet (5 mg total) by mouth in the morning. 07/28/21   Marcos EkeWertman, Sara E, PA-C  metoprolol succinate (TOPROL-XL) 50 MG 24 hr tablet Take 25 mg by mouth in the morning. 09/21/20   [provider]  OLANZapine (ZYPREXA) 10 MG tablet TAKE 1/2 TABLET IN THE MORNING AND TAKE 1 TABLET AT BEDTIME 07/28/21   Gwynneth MunsonWertman, Sung AmabileSara E, PA-C  oxyCODONE-acetaminophen (PERCOCET/ROXICET) 5-325 MG tablet Take 1 tablet by mouth  every 4 (four) hours as needed for severe pain. Patient not taking: Reported on 07/28/2021 10/22/20   Allena KatzPhillips, Eric K, PA-C  sertraline (ZOLOFT) 50 MG tablet Take 1 tablet (50 mg total) by mouth daily. 07/28/21   Marcos EkeWertman, Sara E, PA-C  simvastatin (ZOCOR) 20 MG tablet Take 20 mg by mouth at bedtime.     [provider]  tiZANidine (ZANAFLEX) 2 MG tablet Take 1 tablet (2 mg total) by mouth every 6 (six) hours as needed. 10/22/20   Allena KatzPhillips, Eric K, PA-C  traZODone (DESYREL) 50 MG tablet TAKE 2 TABLETS AT BEDTIME 07/28/21   Marcos EkeWertman, Sara E, PA-C  vitamin B-12 (CYANOCOBALAMIN) 1000 MCG tablet 1 tablet 05/29/21   [provider]      Allergies    Donepezil, Escitalopram oxalate, and Hydrochlorothiazide    Review of Systems   Review of Systems  Unable to perform ROS: Dementia   Physical Exam Updated Vital Signs BP (!) 99/48    Pulse 89    Temp (!) 101.9 F (38.8 C) (Rectal)    Resp (!) 21    SpO2 95%  Physical Exam Vitals and nursing note reviewed.  Constitutional:      General: She is not in acute distress.    Appearance: Normal appearance. She is not diaphoretic.  HENT:     Head: Normocephalic.     Mouth/Throat:     Mouth: Mucous membranes are moist.  Eyes:  Pupils: Pupils are equal, round, and reactive to light.  Cardiovascular:     Rate and Rhythm: Tachycardia present.  Pulmonary:     Effort: Pulmonary effort is normal. No respiratory distress.     Comments: Wet cough Abdominal:     Palpations: Abdomen is soft.     Tenderness: There is no abdominal tenderness.  Musculoskeletal:        General: No swelling or deformity.  Skin:    General: Skin is warm.  Neurological:     Mental Status: She is alert. Mental status is at baseline.     Comments: Demented at baseline  Psychiatric:        Mood and Affect: Mood normal.    ED Results / Procedures / Treatments   Labs (all labs ordered are listed, but only abnormal results are displayed) Labs Reviewed  CBC WITH  DIFFERENTIAL/PLATELET - Abnormal; Notable for the following components:      Result Value   Hemoglobin 11.3 (*)    Lymphs Abs 0.5 (*)    All other components within normal limits  COMPREHENSIVE METABOLIC PANEL - Abnormal; Notable for the following components:   Glucose, Bld 112 (*)    BUN 28 (*)    Creatinine, Ser 1.09 (*)    Calcium 8.5 (*)    Total Protein 6.4 (*)    GFR, Estimated 50 (*)    All other components within normal limits  I-STAT VENOUS BLOOD GAS, ED - Abnormal; Notable for the following components:   pCO2, Ven 37.2 (*)    pO2, Ven 53 (*)    Calcium, Ion 1.05 (*)    HCT 35.0 (*)    Hemoglobin 11.9 (*)    All other components within normal limits  CBG MONITORING, ED - Abnormal; Notable for the following components:   Glucose-Capillary 105 (*)    All other components within normal limits  TROPONIN I (HIGH SENSITIVITY) - Abnormal; Notable for the following components:   Troponin I (High Sensitivity) 25 (*)    All other components within normal limits  RESP PANEL BY RT-PCR (FLU A&B, COVID) ARPGX2  CULTURE, BLOOD (ROUTINE X 2)  CULTURE, BLOOD (ROUTINE X 2)  BRAIN NATRIURETIC PEPTIDE  LACTIC ACID, PLASMA  LACTIC ACID, PLASMA  URINALYSIS, ROUTINE W REFLEX MICROSCOPIC  TROPONIN I (HIGH SENSITIVITY)    EKG None  Radiology DG Chest Port 1 View  Result Date: 08/27/2021 CLINICAL DATA:  Cough. EXAM: PORTABLE CHEST 1 VIEW COMPARISON:  Chest x-ray 10/16/2020 FINDINGS: The heart size and mediastinal contours are within normal limits. Both lungs are clear. The visualized skeletal structures are unremarkable. IMPRESSION: No active disease. Electronically Signed   By: Darliss Cheney M.D.   On: 08/27/2021 17:11    Procedures .Critical Care Performed by: Rozelle Logan, DO Authorized by: Rozelle Logan, DO   Critical care provider statement:    Critical care time (minutes):  60   Critical care time was exclusive of:  Separately billable procedures and treating other  patients   Critical care was necessary to treat or prevent imminent or life-threatening deterioration of the following conditions:  Circulatory failure and shock   Critical care was time spent personally by me on the following activities:  Development of treatment plan with patient or surrogate, discussions with consultants, evaluation of patient's response to treatment, examination of patient, ordering and review of laboratory studies, ordering and review of radiographic studies, ordering and performing treatments and interventions, pulse oximetry, re-evaluation of patient's condition and review  of old charts   I assumed direction of critical care for this patient from another provider in my specialty: no     Care discussed with: admitting provider      Medications Ordered in ED Medications  sodium chloride 0.9 % bolus 500 mL (0 mLs Intravenous Stopped 08/27/21 1847)  acetaminophen (TYLENOL) tablet 1,000 mg (1,000 mg Oral Given 08/27/21 1853)    ED Course/ Medical Decision Making/ A&P                           Medical Decision Making Amount and/or Complexity of Data Reviewed Labs: ordered. Radiology: ordered.  Risk OTC drugs. Prescription drug management.   This patient presents to the ED for concern of change in mental status, fever, this involves an extensive number of treatment options, and is a complaint that carries with it a high risk of complications and morbidity.  The differential diagnosis includes infection, sepsis, AMS, UTI   Additional history obtained: -Additional history obtained from EMS and granddaughter -External records from outside source obtained and reviewed including: Chart review including previous notes, labs, imaging, consultation notes   Lab Tests: -I ordered, reviewed, and interpreted labs.  The pertinent results include: Mildly elevated and uptrending troponin, no leukocytosis, normal lactic, mild AKI, normal abdominal labs, flu and  COVID-negative   EKG -Sinus tachycardia   Imaging Studies ordered: -I ordered imaging studies including chest x-ray, head CT -I independently visualized and interpreted imaging which showed no acute findings -I agree with the radiologist interpretation   Medicines ordered and prescription drug management: -I ordered medication including IV fluids and antipyretic for symptoms -Reevaluation of the patient after these medicines showed that the patient improved -I have reviewed the patients home medicines and have made adjustments as needed   ED Course: 86 year old female presents emergency department with reported change in mental status, cough.  She is febrile and tachycardic on arrival.  Concern for sepsis.  Since being here patient's blood pressure is down trended and at 1 point she was hypotensive with systolics in the 70s to 80s but acceptable MAP.  Patient is a poor historian, level 5 caveat due to dementia.  Initially no family at bedside.  Work-up shows sinus tachycardia, troponin is slightly elevated and uptrending from 25-29.  BNP is negative, no leukocytosis, lactic acid is normal.  There is a mild AKI, urinalysis shows no infection, flu and COVID is negative.  Patient's hypotension has been fluid responsive.  There is been no hypoxia.  PE could be a consideration the patient will require admission regardless.  Granddaughter agrees with admission.   Critical Interventions: IV fluids for hypotension, septic work-up   Cardiac Monitoring: The patient was maintained on a cardiac monitor.  I personally viewed and interpreted the cardiac monitored which showed an underlying rhythm of: Sinus rhythm   Reevaluation: After the interventions noted above, I reevaluated the patient and found that they have :improved   Dispostion: Patients evaluation and results requires admission for further treatment and care.  Spoke with hospitalist, reviewed patient's ED course and they accept  admission.  Patient agrees with admission plan, offers no new complaints and is stable/unchanged at time of admit.        Final Clinical Impression(s) / ED Diagnoses Final diagnoses:  None    Rx / DC Orders ED Discharge Orders     None         Rozelle Logan, DO 08/27/21 2323

## 2021-08-28 ENCOUNTER — Inpatient Hospital Stay (HOSPITAL_COMMUNITY): Payer: Medicare PPO

## 2021-08-28 DIAGNOSIS — Z8673 Personal history of transient ischemic attack (TIA), and cerebral infarction without residual deficits: Secondary | ICD-10-CM | POA: Diagnosis not present

## 2021-08-28 DIAGNOSIS — F03918 Unspecified dementia, unspecified severity, with other behavioral disturbance: Secondary | ICD-10-CM

## 2021-08-28 DIAGNOSIS — A419 Sepsis, unspecified organism: Secondary | ICD-10-CM | POA: Diagnosis present

## 2021-08-28 DIAGNOSIS — R059 Cough, unspecified: Secondary | ICD-10-CM | POA: Diagnosis present

## 2021-08-28 DIAGNOSIS — Z79890 Hormone replacement therapy (postmenopausal): Secondary | ICD-10-CM | POA: Diagnosis not present

## 2021-08-28 DIAGNOSIS — I10 Essential (primary) hypertension: Secondary | ICD-10-CM | POA: Diagnosis present

## 2021-08-28 DIAGNOSIS — Z8249 Family history of ischemic heart disease and other diseases of the circulatory system: Secondary | ICD-10-CM | POA: Diagnosis not present

## 2021-08-28 DIAGNOSIS — E782 Mixed hyperlipidemia: Secondary | ICD-10-CM | POA: Diagnosis present

## 2021-08-28 DIAGNOSIS — E039 Hypothyroidism, unspecified: Secondary | ICD-10-CM | POA: Diagnosis present

## 2021-08-28 DIAGNOSIS — R7303 Prediabetes: Secondary | ICD-10-CM | POA: Diagnosis present

## 2021-08-28 DIAGNOSIS — Z7982 Long term (current) use of aspirin: Secondary | ICD-10-CM | POA: Diagnosis not present

## 2021-08-28 DIAGNOSIS — Z79899 Other long term (current) drug therapy: Secondary | ICD-10-CM | POA: Diagnosis not present

## 2021-08-28 DIAGNOSIS — I2609 Other pulmonary embolism with acute cor pulmonale: Secondary | ICD-10-CM

## 2021-08-28 DIAGNOSIS — I82451 Acute embolism and thrombosis of right peroneal vein: Secondary | ICD-10-CM | POA: Diagnosis present

## 2021-08-28 DIAGNOSIS — Z20822 Contact with and (suspected) exposure to covid-19: Secondary | ICD-10-CM | POA: Diagnosis present

## 2021-08-28 DIAGNOSIS — I82401 Acute embolism and thrombosis of unspecified deep veins of right lower extremity: Secondary | ICD-10-CM | POA: Diagnosis not present

## 2021-08-28 DIAGNOSIS — Z888 Allergy status to other drugs, medicaments and biological substances status: Secondary | ICD-10-CM | POA: Diagnosis not present

## 2021-08-28 DIAGNOSIS — F0393 Unspecified dementia, unspecified severity, with mood disturbance: Secondary | ICD-10-CM | POA: Diagnosis present

## 2021-08-28 DIAGNOSIS — J9601 Acute respiratory failure with hypoxia: Secondary | ICD-10-CM | POA: Diagnosis present

## 2021-08-28 DIAGNOSIS — I2699 Other pulmonary embolism without acute cor pulmonale: Secondary | ICD-10-CM

## 2021-08-28 DIAGNOSIS — J189 Pneumonia, unspecified organism: Secondary | ICD-10-CM | POA: Diagnosis present

## 2021-08-28 DIAGNOSIS — F419 Anxiety disorder, unspecified: Secondary | ICD-10-CM | POA: Diagnosis present

## 2021-08-28 DIAGNOSIS — Z90710 Acquired absence of both cervix and uterus: Secondary | ICD-10-CM | POA: Diagnosis not present

## 2021-08-28 DIAGNOSIS — Z96642 Presence of left artificial hip joint: Secondary | ICD-10-CM | POA: Diagnosis present

## 2021-08-28 DIAGNOSIS — M1990 Unspecified osteoarthritis, unspecified site: Secondary | ICD-10-CM | POA: Diagnosis present

## 2021-08-28 DIAGNOSIS — G9341 Metabolic encephalopathy: Secondary | ICD-10-CM | POA: Diagnosis present

## 2021-08-28 DIAGNOSIS — E079 Disorder of thyroid, unspecified: Secondary | ICD-10-CM | POA: Diagnosis present

## 2021-08-28 LAB — CBC
HCT: 30.9 % — ABNORMAL LOW (ref 36.0–46.0)
Hemoglobin: 9.5 g/dL — ABNORMAL LOW (ref 12.0–15.0)
MCH: 27.4 pg (ref 26.0–34.0)
MCHC: 30.7 g/dL (ref 30.0–36.0)
MCV: 89 fL (ref 80.0–100.0)
Platelets: 142 10*3/uL — ABNORMAL LOW (ref 150–400)
RBC: 3.47 MIL/uL — ABNORMAL LOW (ref 3.87–5.11)
RDW: 15.4 % (ref 11.5–15.5)
WBC: 6.4 10*3/uL (ref 4.0–10.5)
nRBC: 0 % (ref 0.0–0.2)

## 2021-08-28 LAB — BASIC METABOLIC PANEL
Anion gap: 7 (ref 5–15)
BUN: 24 mg/dL — ABNORMAL HIGH (ref 8–23)
CO2: 22 mmol/L (ref 22–32)
Calcium: 7.2 mg/dL — ABNORMAL LOW (ref 8.9–10.3)
Chloride: 107 mmol/L (ref 98–111)
Creatinine, Ser: 0.94 mg/dL (ref 0.44–1.00)
GFR, Estimated: 60 mL/min — ABNORMAL LOW (ref >60)
Glucose, Bld: 104 mg/dL — ABNORMAL HIGH (ref 70–99)
Potassium: 3.7 mmol/L (ref 3.5–5.1)
Sodium: 136 mmol/L (ref 135–145)

## 2021-08-28 LAB — I-STAT ARTERIAL BLOOD GAS, ED
Acid-base deficit: 2 mmol/L (ref 0.0–2.0)
Bicarbonate: 24 mmol/L (ref 20.0–28.0)
Calcium, Ion: 1.13 mmol/L — ABNORMAL LOW (ref 1.15–1.40)
HCT: 28 % — ABNORMAL LOW (ref 36.0–46.0)
Hemoglobin: 9.5 g/dL — ABNORMAL LOW (ref 12.0–15.0)
O2 Saturation: 93 %
Potassium: 3.8 mmol/L (ref 3.5–5.1)
Sodium: 139 mmol/L (ref 135–145)
TCO2: 25 mmol/L (ref 22–32)
pCO2 arterial: 45.2 mmHg (ref 32–48)
pH, Arterial: 7.333 — ABNORMAL LOW (ref 7.35–7.45)
pO2, Arterial: 73 mmHg — ABNORMAL LOW (ref 83–108)

## 2021-08-28 LAB — GLUCOSE, CAPILLARY: Glucose-Capillary: 111 mg/dL — ABNORMAL HIGH (ref 70–99)

## 2021-08-28 LAB — ECHOCARDIOGRAM COMPLETE
AR max vel: 1.88 cm2
AV Peak grad: 15.6 mmHg
Ao pk vel: 1.98 m/s
Area-P 1/2: 2.44 cm2
S' Lateral: 2.85 cm

## 2021-08-28 LAB — PROCALCITONIN: Procalcitonin: 0.16 ng/mL

## 2021-08-28 LAB — CORTISOL-AM, BLOOD: Cortisol - AM: 13.9 ug/dL (ref 6.7–22.6)

## 2021-08-28 LAB — PROTIME-INR
INR: 1.1 (ref 0.8–1.2)
Prothrombin Time: 13.9 seconds (ref 11.4–15.2)

## 2021-08-28 LAB — HEPARIN LEVEL (UNFRACTIONATED): Heparin Unfractionated: 0.48 IU/mL (ref 0.30–0.70)

## 2021-08-28 MED ORDER — HYDROCOD POLI-CHLORPHE POLI ER 10-8 MG/5ML PO SUER
5.0000 mL | Freq: Two times a day (BID) | ORAL | Status: DC | PRN
  Administered 2021-08-29: 5 mL via ORAL
  Filled 2021-08-28: qty 5

## 2021-08-28 MED ORDER — VITAMIN B-12 1000 MCG PO TABS
1000.0000 ug | ORAL_TABLET | Freq: Every day | ORAL | Status: DC
  Administered 2021-08-28 – 2021-08-31 (×4): 1000 ug via ORAL
  Filled 2021-08-28 (×4): qty 1

## 2021-08-28 MED ORDER — METRONIDAZOLE 500 MG/100ML IV SOLN
500.0000 mg | Freq: Two times a day (BID) | INTRAVENOUS | Status: DC
  Administered 2021-08-28: 500 mg via INTRAVENOUS
  Filled 2021-08-28: qty 100

## 2021-08-28 MED ORDER — IPRATROPIUM-ALBUTEROL 0.5-2.5 (3) MG/3ML IN SOLN
3.0000 mL | Freq: Four times a day (QID) | RESPIRATORY_TRACT | Status: DC
  Administered 2021-08-28 (×2): 3 mL via RESPIRATORY_TRACT
  Filled 2021-08-28 (×2): qty 3

## 2021-08-28 MED ORDER — SODIUM CHLORIDE 0.9 % IV SOLN
500.0000 mg | INTRAVENOUS | Status: DC
  Administered 2021-08-28 – 2021-08-29 (×2): 500 mg via INTRAVENOUS
  Filled 2021-08-28 (×2): qty 5

## 2021-08-28 MED ORDER — OLANZAPINE 5 MG PO TABS
5.0000 mg | ORAL_TABLET | Freq: Two times a day (BID) | ORAL | Status: DC
Start: 2021-08-28 — End: 2021-08-29
  Administered 2021-08-28: 5 mg via ORAL
  Filled 2021-08-28 (×3): qty 1

## 2021-08-28 MED ORDER — HALOPERIDOL LACTATE 5 MG/ML IJ SOLN
1.0000 mg | Freq: Four times a day (QID) | INTRAMUSCULAR | Status: DC | PRN
  Administered 2021-08-28 – 2021-08-29 (×2): 1 mg via INTRAVENOUS
  Filled 2021-08-28 (×2): qty 1

## 2021-08-28 MED ORDER — LEVOTHYROXINE SODIUM 75 MCG PO TABS
75.0000 ug | ORAL_TABLET | Freq: Every day | ORAL | Status: DC
  Administered 2021-08-28 – 2021-08-29 (×2): 75 ug via ORAL
  Filled 2021-08-28 (×2): qty 1

## 2021-08-28 MED ORDER — VANCOMYCIN HCL 1250 MG/250ML IV SOLN
1250.0000 mg | Freq: Once | INTRAVENOUS | Status: DC
  Filled 2021-08-28: qty 250

## 2021-08-28 MED ORDER — GABAPENTIN 100 MG PO CAPS: 100.0000 mg | ORAL_CAPSULE | ORAL | Status: DC

## 2021-08-28 MED ORDER — VANCOMYCIN HCL 750 MG/150ML IV SOLN
750.0000 mg | INTRAVENOUS | Status: DC
Start: 2021-08-29 — End: 2021-08-28

## 2021-08-28 MED ORDER — SODIUM CHLORIDE 0.9 % IV SOLN
2.0000 g | Freq: Two times a day (BID) | INTRAVENOUS | Status: DC
  Administered 2021-08-28: 2 g via INTRAVENOUS
  Filled 2021-08-28: qty 2

## 2021-08-28 MED ORDER — GUAIFENESIN 100 MG/5ML PO LIQD
10.0000 mL | ORAL | Status: DC
  Administered 2021-08-28 – 2021-08-31 (×15): 10 mL via ORAL
  Filled 2021-08-28 (×18): qty 10

## 2021-08-28 MED ORDER — ASPIRIN 81 MG PO CHEW
81.0000 mg | CHEWABLE_TABLET | Freq: Every day | ORAL | Status: DC
  Administered 2021-08-28 – 2021-08-31 (×4): 81 mg via ORAL
  Filled 2021-08-28 (×4): qty 1

## 2021-08-28 MED ORDER — METOPROLOL SUCCINATE ER 25 MG PO TB24
25.0000 mg | ORAL_TABLET | Freq: Every day | ORAL | Status: DC
  Administered 2021-08-28 – 2021-08-31 (×4): 25 mg via ORAL
  Filled 2021-08-28 (×4): qty 1

## 2021-08-28 MED ORDER — HEPARIN BOLUS VIA INFUSION
4000.0000 [IU] | Freq: Once | INTRAVENOUS | Status: AC
  Administered 2021-08-28: 4000 [IU] via INTRAVENOUS
  Filled 2021-08-28: qty 4000

## 2021-08-28 MED ORDER — SODIUM CHLORIDE 0.9 % IV SOLN
2.0000 g | INTRAVENOUS | Status: DC
  Administered 2021-08-28 – 2021-08-31 (×4): 2 g via INTRAVENOUS
  Filled 2021-08-28 (×4): qty 20

## 2021-08-28 MED ORDER — TRAZODONE HCL 50 MG PO TABS: 100.0000 mg | ORAL_TABLET | Freq: Every day | ORAL | Status: DC

## 2021-08-28 MED ORDER — OLANZAPINE 10 MG PO TABS
10.0000 mg | ORAL_TABLET | Freq: Every day | ORAL | Status: DC
  Administered 2021-08-28: 10 mg via ORAL
  Filled 2021-08-28: qty 1

## 2021-08-28 MED ORDER — TIZANIDINE HCL 4 MG PO TABS: 2.0000 mg | ORAL_TABLET | Freq: Four times a day (QID) | ORAL | Status: DC | PRN

## 2021-08-28 MED ORDER — GUAIFENESIN ER 600 MG PO TB12
600.0000 mg | ORAL_TABLET | Freq: Two times a day (BID) | ORAL | Status: DC
  Administered 2021-08-28: 600 mg via ORAL
  Filled 2021-08-28: qty 1

## 2021-08-28 MED ORDER — GABAPENTIN 100 MG PO CAPS: 100.0000 mg | ORAL_CAPSULE | Freq: Every day | ORAL | Status: DC

## 2021-08-28 MED ORDER — OLANZAPINE 10 MG PO TABS: 10.0000 mg | ORAL_TABLET | ORAL | Status: DC

## 2021-08-28 MED ORDER — ENOXAPARIN SODIUM 40 MG/0.4ML IJ SOSY: 40.0000 mg | PREFILLED_SYRINGE | Freq: Every day | INTRAMUSCULAR | Status: DC

## 2021-08-28 MED ORDER — ENOXAPARIN SODIUM 80 MG/0.8ML IJ SOSY
75.0000 mg | PREFILLED_SYRINGE | Freq: Two times a day (BID) | INTRAMUSCULAR | Status: DC
  Administered 2021-08-28 – 2021-08-30 (×4): 75 mg via SUBCUTANEOUS
  Filled 2021-08-28 (×4): qty 0.8

## 2021-08-28 MED ORDER — LISINOPRIL 5 MG PO TABS
5.0000 mg | ORAL_TABLET | Freq: Every day | ORAL | Status: DC
  Administered 2021-08-28 – 2021-08-29 (×2): 5 mg via ORAL
  Filled 2021-08-28 (×2): qty 1

## 2021-08-28 MED ORDER — MEMANTINE HCL 10 MG PO TABS
5.0000 mg | ORAL_TABLET | Freq: Every day | ORAL | Status: DC
Start: 2021-08-28 — End: 2021-08-31
  Administered 2021-08-28 – 2021-08-31 (×4): 5 mg via ORAL
  Filled 2021-08-28 (×4): qty 1

## 2021-08-28 MED ORDER — IPRATROPIUM-ALBUTEROL 0.5-2.5 (3) MG/3ML IN SOLN
3.0000 mL | Freq: Two times a day (BID) | RESPIRATORY_TRACT | Status: DC
  Administered 2021-08-28 – 2021-08-31 (×5): 3 mL via RESPIRATORY_TRACT
  Filled 2021-08-28 (×6): qty 3

## 2021-08-28 MED ORDER — OLANZAPINE 5 MG PO TABS
5.0000 mg | ORAL_TABLET | Freq: Every day | ORAL | Status: DC
  Filled 2021-08-28: qty 1

## 2021-08-28 MED ORDER — IOHEXOL 350 MG/ML SOLN
75.0000 mL | Freq: Once | INTRAVENOUS | Status: AC | PRN
  Administered 2021-08-28: 75 mL via INTRAVENOUS

## 2021-08-28 MED ORDER — SIMVASTATIN 20 MG PO TABS
20.0000 mg | ORAL_TABLET | Freq: Every day | ORAL | Status: DC
  Administered 2021-08-28 – 2021-08-30 (×3): 20 mg via ORAL
  Filled 2021-08-28 (×3): qty 1

## 2021-08-28 MED ORDER — HEPARIN (PORCINE) 25000 UT/250ML-% IV SOLN
850.0000 [IU]/h | INTRAVENOUS | Status: DC
  Administered 2021-08-28: 850 [IU]/h via INTRAVENOUS
  Filled 2021-08-28: qty 250

## 2021-08-28 MED ORDER — GABAPENTIN 100 MG PO CAPS
200.0000 mg | ORAL_CAPSULE | Freq: Every day | ORAL | Status: DC
  Administered 2021-08-28: 200 mg via ORAL
  Filled 2021-08-28: qty 2

## 2021-08-28 MED ORDER — ASPIRIN EC 81 MG PO TBEC: 81.0000 mg | DELAYED_RELEASE_TABLET | Freq: Every day | ORAL | Status: DC

## 2021-08-28 MED ORDER — SERTRALINE HCL 50 MG PO TABS
50.0000 mg | ORAL_TABLET | Freq: Every day | ORAL | Status: DC
  Administered 2021-08-28 – 2021-08-31 (×4): 50 mg via ORAL
  Filled 2021-08-28 (×3): qty 1

## 2021-08-28 MED ORDER — VITAMIN D 25 MCG (1000 UNIT) PO TABS
1000.0000 [IU] | ORAL_TABLET | Freq: Every day | ORAL | Status: DC
  Administered 2021-08-28 – 2021-08-31 (×4): 1000 [IU] via ORAL
  Filled 2021-08-28 (×4): qty 1

## 2021-08-28 NOTE — ED Notes (Signed)
Admitting Provider at bedside. 

## 2021-08-28 NOTE — Progress Notes (Signed)
Echocardiogram ?2D Echocardiogram has been performed. ? ?Eduard Roux ?08/28/2021, 10:54 AM ?

## 2021-08-28 NOTE — Progress Notes (Signed)
ANTICOAGULATION CONSULT NOTE - Follow Up Consult ? ?Pharmacy Consult for Heparin>>>Lovenox ?Indication: pulmonary embolus ? ?Allergies  ?Allergen Reactions  ? Donepezil Other (See Comments)  ?  POSSIBLE (??) Hallucinations and sleep disturbances  ? Escitalopram Oxalate Other (See Comments)  ?  UNK reaction  ? Hydrochlorothiazide Other (See Comments)  ?  UNK reaction  ? ? ?Vital Signs: ?Temp: 97.7 ?F (36.5 ?C) (03/10 2032) ?Temp Source: Axillary (03/10 2032) ?BP: 140/99 (03/10 2032) ?Pulse Rate: 104 (03/10 2032) ? ?Labs: ?Recent Labs  ?  08/27/21 ?1655 08/27/21 ?1708 08/27/21 ?1918 08/28/21 ?0426 08/28/21 ?[id number] 08/28/21 ?1150  ?HGB 11.3* 11.9*  --  9.5* 9.5*  --   ?HCT 37.3 35.0*  --  30.9* 28.0*  --   ?PLT 163  --   --  142*  --   --   ?LABPROT  --   --   --  13.9  --   --   ?INR  --   --   --  1.1  --   --   ?HEPARINUNFRC  --   --   --   --   --  0.48  ?CREATININE 1.09*  --   --  0.94  --   --   ?TROPONINIHS 25*  --  29*  --   --   --   ? ? ? ?Estimated Creatinine Clearance: 45.9 mL/min (by C-G formula based on SCr of 0.94 mg/dL). ? ?Assessment: ?86 yo F presented to the ED with AMS, low-grade fevers at home, cough, and dyspnea. CT PE (3/10) showed bilateral pulmonary emboli without evidence of right heart strain. Pharmacy consulted to manage heparin for acute PE. ? ?Hgb/Hct/Plt stable ?Heparin level 0.48, therapeutic ?No reported s/sx of bleeding ? ?Ok to transition heparin to Lovenox before switching to NOAC per Dr. Cyd Silence. ? ?Goal of Therapy:  ?Anti-Xa 0.6-1 ?Monitor platelets by anticoagulation protocol: Yes ?  ?Plan:  ?Dc heparin ?Lovenox 75mg  SQ BID ?F/u PO AC ? ?Onnie Boer, PharmD, BCIDP, AAHIVP, CPP ?Infectious Disease Pharmacist ?08/28/2021 9:11 PM ? ? ? ? ?

## 2021-08-28 NOTE — ED Notes (Signed)
MD paged regarding pt bp.. no interventions at this time. ?

## 2021-08-28 NOTE — Assessment & Plan Note (Signed)
-   We will continue statin therapy. 

## 2021-08-28 NOTE — TOC Initial Note (Signed)
Transition of Care (TOC) - Initial/Assessment Note  ? ? ?Patient Details  ?Name: [name] ?MRN: [id number] ?Date of Birth: 1935-11-09 ? ?Transition of Care [facility]) CM/SW Contact:    ?Ninfa Meeker, RN ?Phone Number: ?08/28/2021, 4:09 PM ? ?Clinical Narrative:      ?Transition of Care Screening Note           ? ?Transition of Care Department [facility]) has reviewed patient and no TOC needs have been identified at this time. We will continue to monitor patient advancement through Interdisciplinary progressions. If new patient transition needs arise, please place a consult.   ?  ? ? ?Patient Goals and CMS Choice ?  ?  ?  ? ?Expected Discharge Plan and Services ?  ?  ?  ?  ?  ?                ?  ?  ?  ?  ?  ?  ?  ?  ?  ?  ? ?Prior Living Arrangements/Services ?  ?  ?  ?       ?  ?  ?  ?  ? ?Activities of Daily Living ?Home Assistive Devices/Equipment: Gilford Rile (specify type), Shower chair with back, Dentures (specify type) ?ADL Screening (condition at time of admission) ?Patient's cognitive ability adequate to safely complete daily activities?: Yes ?Is the patient deaf or have difficulty hearing?: Yes ?Does the patient have difficulty seeing, even when wearing glasses/contacts?: No ?Does the patient have difficulty concentrating, remembering, or making decisions?: Yes ?Patient able to express need for assistance with ADLs?: Yes ?Does the patient have difficulty dressing or bathing?: Yes ?Independently performs ADLs?: No ?Communication: Independent ?Dressing (OT): Independent ?Grooming: Needs assistance ?Is this a change from baseline?: Pre-admission baseline ?Feeding: Needs assistance ?Is this a change from baseline?: Pre-admission baseline ?Bathing: Needs assistance ?Is this a change from baseline?: Pre-admission baseline ?Toileting: Independent ?In/Out Bed: Independent ?Walks in Home: Independent ?Does the patient have difficulty walking or climbing stairs?: Yes ?Weakness of Legs: Both ?Weakness of Arms/Hands:  None ? ?Permission Sought/Granted ?  ?  ?   ?   ?   ?   ? ?Emotional Assessment ?  ?  ?  ?  ?  ?  ? ?Admission diagnosis:  Tachycardia [R00.0] ?Sepsis ([place]) [A41.9] ?Hypotension, unspecified hypotension type [I95.9] ?Patient Active Problem List  ? Diagnosis Date Noted  ? Sepsis ([place]) 08/28/2021  ? Bilateral pulmonary embolism ([place]) 08/28/2021  ? Hypothyroidism, unspecified 08/28/2021  ? H/O total hip arthroplasty, left 10/22/2020  ? Osteoarthritis of left hip 02/06/2020  ? Anxiety 02/04/2020  ? CVA (cerebral vascular accident) ([place]) 02/04/2020  ? Depression 02/04/2020  ? Essential hypertension 02/04/2020  ? HSV-2 (herpes simplex virus 2) infection 02/04/2020  ? Insomnia 02/04/2020  ? Vitamin D deficiency 02/04/2020  ? Constipation 01/09/2020  ? Hearing loss 01/09/2020  ? Hx of completed stroke 01/09/2020  ? Hx of herpes simplex infection 01/09/2020  ? Mixed dyslipidemia 01/09/2020  ? Normocytic anemia 01/09/2020  ? Prediabetes 01/09/2020  ? Primary osteoarthritis involving multiple joints 01/09/2020  ? Venous insufficiency 01/09/2020  ? Vitamin B12 deficiency 01/09/2020  ? Dementia with behavioral disturbance 01/08/2020  ? Delusional thoughts ([place]) 01/06/2020  ? Aggressive behavior   ? Hallucinations   ? Acquired hypothyroidism 03/18/2014  ? Mixed hyperlipidemia 03/18/2014  ? ?PCP:  Maurice Small, MD ?Pharmacy:   ?[facility] [address] ?[place] [address] ?Phone: [phone] Fax: [phone] ? ? ? ? ?Social Determinants of Health (SDOH) Interventions ?  ? ?Readmission Risk Interventions ?No flowsheet data found. ? ? ?

## 2021-08-28 NOTE — Progress Notes (Signed)
Pharmacy Antibiotic Note ? ?[name] is a 86 y.o. female admitted on 08/27/2021 with AMS, possible sepsis.  Pharmacy has been consulted for Vancomycin and Cefepime  dosing. ? ?Plan: ?Vancomycin 1250 mg IV now, then 750 mg IV q24h ?Cefepime 2 g IV q12h ? ?  ? ?Temp (24hrs), Avg:99.9 ?F (37.7 ?C), Min:97.8 ?F (36.6 ?C), Max:101.9 ?F (38.8 ?C) ? ?Recent Labs  ?Lab 08/27/21 ?1655  ?WBC 6.2  ?CREATININE 1.09*  ?LATICACIDVEN 0.8  ?  ?CrCl cannot be calculated (Unknown ideal weight.).   ? ?Allergies  ?Allergen Reactions  ? Donepezil Other (See Comments)  ?  POSSIBLE (??) Hallucinations and sleep disturbances  ? Escitalopram Oxalate Other (See Comments)  ? Hydrochlorothiazide Other (See Comments)  ? ? ? ?[name], [name] ?08/28/2021 1:24 AM ? ?

## 2021-08-28 NOTE — Assessment & Plan Note (Signed)
-   We will continue Zoloft 

## 2021-08-28 NOTE — Assessment & Plan Note (Addendum)
-   The patient will be admitted to a medical telemetry bed. ?- We will place on IV heparin per PE protocol. ?- We will obtain a 2D echo and bilateral lower extremity venous Doppler for further assessment. ?- We will monitor her blood pressure.  It has responded to fluids initially given. ?- O2 protocol will be followed for associated hypoxemia/acute hypoxic respiratory failure. ?- We will follow BMP and troponin I for prognostic value. ?

## 2021-08-28 NOTE — Assessment & Plan Note (Signed)
-   We will continue Namenda and Zyprexa. ?

## 2021-08-28 NOTE — Assessment & Plan Note (Addendum)
-   The patient has a left lower lobe consolidation. ?- While this could be partly related to pulmonary infarct 1 could not rule out community-acquired pneumonia especially given her high fever.  She may meet severe sepsis criteria given initial hypotension. ?- We will continue her for now on IV Rocephin and Zithromax. ?- We will follow blood cultures and obtain sputum culture. ?The patient will be hydrated with IV normal saline. ?

## 2021-08-28 NOTE — Assessment & Plan Note (Signed)
-   We will continue Synthroid. 

## 2021-08-28 NOTE — Progress Notes (Signed)
ANTICOAGULATION CONSULT NOTE - Follow Up Consult ? ?Pharmacy Consult for Heparin infusion ?Indication: pulmonary embolus ? ?Allergies  ?Allergen Reactions  ? Donepezil Other (See Comments)  ?  POSSIBLE (??) Hallucinations and sleep disturbances  ? Escitalopram Oxalate Other (See Comments)  ?  UNK reaction  ? Hydrochlorothiazide Other (See Comments)  ?  UNK reaction  ? ? ?Vital Signs: ?Temp: 98 ?F (36.7 ?C) (03/10 0751) ?Temp Source: Axillary (03/10 0751) ?BP: 118/50 (03/10 1200) ?Pulse Rate: 81 (03/10 1200) ? ?Labs: ?Recent Labs  ?  08/27/21 ?1655 08/27/21 ?1708 08/27/21 ?1918 08/28/21 ?0426 08/28/21 ?9678 08/28/21 ?1150  ?HGB 11.3* 11.9*  --  9.5* 9.5*  --   ?HCT 37.3 35.0*  --  30.9* 28.0*  --   ?PLT 163  --   --  142*  --   --   ?LABPROT  --   --   --  13.9  --   --   ?INR  --   --   --  1.1  --   --   ?HEPARINUNFRC  --   --   --   --   --  0.48  ?CREATININE 1.09*  --   --  0.94  --   --   ?TROPONINIHS 25*  --  29*  --   --   --   ? ? ?CrCl cannot be calculated (Unknown ideal weight.). ? ?Assessment: ?86 yo F presented to the ED with AMS, low-grade fevers at home, cough, and dyspnea. CT PE (3/10) showed bilateral pulmonary emboli without evidence of right heart strain. Pharmacy consulted to manage heparin for acute PE. ? ?Hgb/Hct/Plt stable ?Heparin level 0.48, therapeutic ?No reported s/sx of bleeding ? ?Goal of Therapy:  ?Heparin level 0.3-0.7 units/ml ?Monitor platelets by anticoagulation protocol: Yes ?  ?Plan:  ?Continue heparin infusion at 850 units/hr ?Check heparin level in 8 hours and if therapeutic, monitory heparin levels daily with CBC. ?Monitor for s/sx of bleeding ? ?Doristine Counter ?08/28/2021,12:47 PM ? ? ?

## 2021-08-28 NOTE — Progress Notes (Signed)
Lower extremity venous bilateral study completed.  Preliminary results relayed to Elgergawy, MD.  See CV Proc for preliminary results report.   Eliska Hamil, RDMS, RVT  

## 2021-08-28 NOTE — Progress Notes (Signed)
PROGRESS NOTE    NAZIYAH [name]  AFH:[id number] DOB: 1935/11/24 DOA: 08/27/2021 PCP: [name], MD   Chief Complaint  Patient presents with   Altered Mental Status    Brief Narrative:   [name] is a 86 y.o. Caucasian female with medical history significant for anxiety, depression, hypertension, CVA and thyroid disease, who presented to the ER with acute onset of altered mental status as well as low-grade fever at home with associated congested cough with inability to expectorate as well as dyspnea.  Patient is not on home O2.  She denies any chest pain or palpitations.  Her work-up is significant for bilateral PE and pneumonia.    Assessment & Plan:   Principal Problem:   Bilateral pulmonary embolism (HCC) Active Problems:   Sepsis (HCC)   Dementia with behavioral disturbance   Depression   Essential hypertension   Mixed dyslipidemia   Hypothyroidism, unspecified  Bilateral pulmonary embolism (HCC)/ acute DVT RLL -CTA significant for extensive bilateral PE, and Doppler significant for acute right lower extremity DVT -IV heparin GTT for 48 hours then can be transitioned to Eliquis -Follow on 2D echo to evaluate for right heart strain   Sepsis [facility]) present on admission -Patient met sepsis criteria given hypotension, tachypnea, tachycardia and fever -Related to pneumonia -Continue with IV Rocephin and azithromycin.  Altered mental status -Patient is lethargic this morning. -ABG with no evidence of CO2 retention. -CT head with no acute findings. -She is on multiple sedative medications I will hold gabapentin, Zyprexa and trazodone.   Hypothyroidism, unspecified - We will continue Synthroid.   Mixed dyslipidemia - We will continue statin therapy.   Essential hypertension - We will continue her antihypertensives.   Depression - We will continue Zoloft.   Dementia with behavioral disturbance - We will continue Namenda  - will hold Zyprexa due to  lethargy.    DVT prophylaxis: Heparin GTT Code Status: [place] Family Communication: none at bedside Disposition:   Status is: Inpatient Remains inpatient appropriate because: iv heparin and ABX   Consultants:  None   Subjective:  Patient is sleepy/drowsy, wake up answer couple questions then go back to sleep, otherwise no significant events overnight.  Objective: Vitals:   08/28/21 0710 08/28/21 0715 08/28/21 0751 08/28/21 1000  BP: (!) 105/94 (!) 102/54 108/62 (!) 92/44  Pulse: 71 72 65 70  Resp: 13 17 14 15   Temp:   98 F (36.7 C)   TempSrc:   Axillary   SpO2: 98% 98% 98% 97%    Intake/Output Summary (Last 24 hours) at 08/28/2021 1025 Last data filed at 08/28/2021 0516 Gross per 24 hour  Intake 384.22 ml  Output 1000 ml  Net -615.78 ml   There were no vitals filed for this visit.  Examination:  Lethargic, but wakes up open eyes answer couple questions, frail, deconditioned, confused. Symmetrical Chest wall movement, Good air movement bilaterally, CTAB RRR,No Gallops,Rubs or new Murmurs, No Parasternal Heave +ve B.Sounds, Abd Soft, No tenderness, No rebound - guarding or rigidity. No Cyanosis, Clubbing or edema, No new Rash or bruise      Data Reviewed: I have personally reviewed following labs and imaging studies  CBC: Recent Labs  Lab 08/27/21 1655 08/27/21 1708 08/28/21 0426 08/28/21 0839  WBC 6.2  --  6.4  --   NEUTROABS 5.3  --   --   --   HGB 11.3* 11.9* 9.5* 9.5*  HCT 37.3 35.0* 30.9* 28.0*  MCV 89.2  --  89.0  --  PLT 163  --  142*  --     Basic Metabolic Panel: Recent Labs  Lab 08/27/21 1655 08/27/21 1708 08/28/21 0426 08/28/21 0839  NA 135 135 136 139  K 4.3 4.3 3.7 3.8  CL 102  --  107  --   CO2 23  --  22  --   GLUCOSE 112*  --  104*  --   BUN 28*  --  24*  --   CREATININE 1.09*  --  0.94  --   CALCIUM 8.5*  --  7.2*  --     GFR: CrCl cannot be calculated (Unknown ideal weight.).  Liver Function Tests: Recent Labs   Lab 08/27/21 1655  AST 23  ALT 16  ALKPHOS 46  BILITOT 0.6  PROT 6.4*  ALBUMIN 3.5    CBG: Recent Labs  Lab 08/27/21 1656  GLUCAP 105*     Recent Results (from the past 240 hour(s))  Resp Panel by RT-PCR (Flu A&B, Covid) Nasopharyngeal Swab     Status: None   Collection Time: 08/27/21  4:56 PM   Specimen: Nasopharyngeal Swab; Nasopharyngeal(NP) swabs in vial transport medium  Result Value Ref Range Status   SARS Coronavirus 2 by RT PCR NEGATIVE NEGATIVE Final    Comment: (NOTE) SARS-CoV-2 target nucleic acids are NOT DETECTED.  The SARS-CoV-2 RNA is generally detectable in upper respiratory specimens during the acute phase of infection. The lowest concentration of SARS-CoV-2 viral copies this assay can detect is 138 copies/mL. A negative result does not preclude SARS-Cov-2 infection and should not be used as the sole basis for treatment or other patient management decisions. A negative result may occur with  improper specimen collection/handling, submission of specimen other than nasopharyngeal swab, presence of viral mutation(s) within the areas targeted by this assay, and inadequate number of viral copies(<138 copies/mL). A negative result must be combined with clinical observations, patient history, and epidemiological information. The expected result is Negative.  Fact Sheet for Patients:  EntrepreneurPulse.com.au  Fact Sheet for Healthcare Providers:  IncredibleEmployment.be  This test is no t yet approved or cleared by the [place] FDA and  has been authorized for detection and/or diagnosis of SARS-CoV-2 by FDA under an Emergency Use Authorization (EUA). This EUA will remain  in effect (meaning this test can be used) for the duration of the COVID-19 declaration under Section 564(b)(1) of the Act, 21 U.S.C.section 360bbb-3(b)(1), unless the authorization is terminated  or revoked sooner.       Influenza A by PCR  NEGATIVE NEGATIVE Final   Influenza B by PCR NEGATIVE NEGATIVE Final    Comment: (NOTE) The Xpert Xpress SARS-CoV-2/FLU/RSV plus assay is intended as an aid in the diagnosis of influenza from Nasopharyngeal swab specimens and should not be used as a sole basis for treatment. Nasal washings and aspirates are unacceptable for Xpert Xpress SARS-CoV-2/FLU/RSV testing.  Fact Sheet for Patients: EntrepreneurPulse.com.au  Fact Sheet for Healthcare Providers: IncredibleEmployment.be  This test is not yet approved or cleared by the [place] FDA and has been authorized for detection and/or diagnosis of SARS-CoV-2 by FDA under an Emergency Use Authorization (EUA). This EUA will remain in effect (meaning this test can be used) for the duration of the COVID-19 declaration under Section 564(b)(1) of the Act, 21 U.S.C. section 360bbb-3(b)(1), unless the authorization is terminated or revoked.  Performed at [facility], [place] [address] [place], [place] [address]          Radiology Studies: CT  Head Wo Contrast  Result Date: 08/27/2021 CLINICAL DATA:  Altered mental status EXAM: CT HEAD WITHOUT CONTRAST TECHNIQUE: Contiguous axial images were obtained from the base of the skull through the vertex without intravenous contrast. RADIATION DOSE REDUCTION: This exam was performed according to the departmental dose-optimization program which includes automated exposure control, adjustment of the mA and/or kV according to patient size and/or use of iterative reconstruction technique. COMPARISON:  02/16/2020 FINDINGS: Brain: No evidence of acute infarction, hemorrhage, hydrocephalus, extra-axial collection or mass lesion/mass effect. Chronic atrophic and ischemic changes are noted. Vascular: No hyperdense vessel or unexpected calcification. Skull: Normal. Negative for fracture or focal lesion. Sinuses/Orbits: No acute finding. Other: None. IMPRESSION: Chronic  atrophic and ischemic changes stable from the prior exam. No acute abnormality noted. Electronically Signed   By: Inez Catalina M.D.   On: 08/27/2021 22:41   CT Angio Chest PE W/Cm &/Or Wo Cm  Result Date: 08/28/2021 CLINICAL DATA:  Cough and altered mental status EXAM: CT ANGIOGRAPHY CHEST WITH CONTRAST TECHNIQUE: Multidetector CT imaging of the chest was performed using the standard protocol during bolus administration of intravenous contrast. Multiplanar CT image reconstructions and MIPs were obtained to evaluate the vascular anatomy. RADIATION DOSE REDUCTION: This exam was performed according to the departmental dose-optimization program which includes automated exposure control, adjustment of the mA and/or kV according to patient size and/or use of iterative reconstruction technique. CONTRAST:  81mL OMNIPAQUE IOHEXOL 350 MG/ML SOLN COMPARISON:  Chest x-ray from the previous day. FINDINGS: Cardiovascular: Thoracic aorta shows a normal branching pattern. Atherosclerotic calcifications are noted without aneurysmal dilatation or dissection. Heart is mildly enlarged in size. Coronary calcifications are noted. The pulmonary artery shows filling defects throughout both pulmonary arteries but primarily within the lower lobes bilaterally. This is consistent with extensive pulmonary emboli. No evidence of right heart strain is seen. Mediastinum/Nodes: Thoracic inlet is within normal limits. Esophagus is within normal limits. No sizable hilar or mediastinal adenopathy is noted. Lungs/Pleura: Mucous is noted throughout the trachea and bronchial tree bilaterally. Lungs are well aerated bilaterally. Left lower lobe consolidation is noted. No sizable parenchymal nodules seen. Upper Abdomen: Visualized upper abdomen is within normal limits. Musculoskeletal: Degenerative changes of the thoracic spine are noted. No acute rib abnormality is seen. Review of the MIP images confirms the above findings. IMPRESSION: Bilateral  pulmonary emboli without evidence of right heart strain. Mild left lower lobe consolidation. No pulmonary emboli this may represent early changes of pulmonary infarct. Cardiomegaly. Aortic Atherosclerosis (ICD10-I70.0). Critical Value/emergent results were called by telephone at the time of interpretation on 08/28/2021 at 1:28 am to Dr. Dayna Barker, who verbally acknowledged these results. Electronically Signed   By: Inez Catalina M.D.   On: 08/28/2021 01:32   DG Chest Port 1 View  Result Date: 08/27/2021 CLINICAL DATA:  Cough. EXAM: PORTABLE CHEST 1 VIEW COMPARISON:  Chest x-ray 10/16/2020 FINDINGS: The heart size and mediastinal contours are within normal limits. Both lungs are clear. The visualized skeletal structures are unremarkable. IMPRESSION: No active disease. Electronically Signed   By: Ronney Asters M.D.   On: 08/27/2021 17:11   VAS [place] LOWER EXTREMITY VENOUS (DVT)  Result Date: 08/28/2021  Lower Venous DVT Study Patient Name:  QUETZALLI CLOS  Date of Exam:   08/28/2021 Medical Rec #: [id number]      Accession #:    [id number] Date of Birth: 01-Feb-1936      Patient Gender: F Patient Age:   30 years Exam Location:  [facility] Procedure:  VAS [place] LOWER EXTREMITY VENOUS (DVT) Referring Phys: Eugenie Norrie --------------------------------------------------------------------------------  Indications: Pulmonary embolism.  Comparison Study: No prior studies. Performing Technologist: Darlin Coco RDMS, RVT  Examination Guidelines: A complete evaluation includes B-mode imaging, spectral Doppler, color Doppler, and power Doppler as needed of all accessible portions of each vessel. Bilateral testing is considered an integral part of a complete examination. Limited examinations for reoccurring indications may be performed as noted. The reflux portion of the exam is performed with the patient in reverse Trendelenburg.  +---------+---------------+---------+-----------+----------+--------------+  RIGHT      Compressibility Phasicity Spontaneity Properties Thrombus Aging  +---------+---------------+---------+-----------+----------+--------------+  CFV       Full            Yes       Yes                                    +---------+---------------+---------+-----------+----------+--------------+  SFJ       Full                                                             +---------+---------------+---------+-----------+----------+--------------+  FV Prox   Full                                                             +---------+---------------+---------+-----------+----------+--------------+  FV Mid    Full                                                             +---------+---------------+---------+-----------+----------+--------------+  FV Distal Full                                                             +---------+---------------+---------+-----------+----------+--------------+  PFV       Full                                                             +---------+---------------+---------+-----------+----------+--------------+  POP       Full                                                             +---------+---------------+---------+-----------+----------+--------------+  PTV       Full                                                             +---------+---------------+---------+-----------+----------+--------------+  PERO      None            No        No                     Acute           +---------+---------------+---------+-----------+----------+--------------+  Gastroc   Full                                                             +---------+---------------+---------+-----------+----------+--------------+   +---------+---------------+---------+-----------+----------+--------------+  LEFT      Compressibility Phasicity Spontaneity Properties Thrombus Aging  +---------+---------------+---------+-----------+----------+--------------+  CFV       Full            Yes       Yes                                     +---------+---------------+---------+-----------+----------+--------------+  SFJ       Full                                                             +---------+---------------+---------+-----------+----------+--------------+  FV Prox   Full                                                             +---------+---------------+---------+-----------+----------+--------------+  FV Mid    Full                                                             +---------+---------------+---------+-----------+----------+--------------+  FV Distal Full                                                             +---------+---------------+---------+-----------+----------+--------------+  PFV       Full                                                             +---------+---------------+---------+-----------+----------+--------------+  POP       Full            Yes       Yes                                    +---------+---------------+---------+-----------+----------+--------------+  PTV       Full                                                             +---------+---------------+---------+-----------+----------+--------------+  PERO      Full                                                             +---------+---------------+---------+-----------+----------+--------------+  Gastroc   Full                                                             +---------+---------------+---------+-----------+----------+--------------+     Summary: RIGHT: - Findings consistent with acute deep vein thrombosis involving the right peroneal veins.  - No cystic structure found in the popliteal fossa.  LEFT: - There is no evidence of deep vein thrombosis in the lower extremity.  - No cystic structure found in the popliteal fossa.  *See table(s) above for measurements and observations.    Preliminary         Scheduled Meds:  aspirin EC  81 mg Oral Daily   cholecalciferol  1,000 Units Oral Daily   gabapentin  100  mg Oral Daily   And   gabapentin  200 mg Oral QHS   guaiFENesin  600 mg Oral BID   ipratropium-albuterol  3 mL Nebulization QID   levothyroxine  75 mcg Oral Q0600   lisinopril  5 mg Oral Daily   memantine  5 mg Oral Daily   metoprolol succinate  25 mg Oral Daily   OLANZapine  5 mg Oral Daily   And   OLANZapine  10 mg Oral QHS   sertraline  50 mg Oral Daily   simvastatin  20 mg Oral QHS   traZODone  100 mg Oral QHS   vitamin B-12  1,000 mcg Oral Daily   Continuous Infusions:  azithromycin Stopped (08/28/21 0516)   cefTRIAXone (ROCEPHIN)  IV     heparin 850 Units/hr (08/28/21 0815)     LOS: 0 days       Phillips Climes, MD [facility]   To contact the attending provider between 7A-7P or the covering provider during after hours 7P-7A, please log into the web site [email or web address] and access using universal [place] password for that web site. If you do not have the password, please call the hospital operator.  08/28/2021, 10:25 AM

## 2021-08-28 NOTE — Assessment & Plan Note (Signed)
-   We will continue her antihypertensives. 

## 2021-08-28 NOTE — Progress Notes (Signed)
ANTICOAGULATION CONSULT NOTE - Initial Consult ? ?Pharmacy Consult for Heparin  ?Indication: pulmonary embolus ? ?Allergies  ?Allergen Reactions  ? Donepezil Other (See Comments)  ?  POSSIBLE (??) Hallucinations and sleep disturbances  ? Escitalopram Oxalate Other (See Comments)  ? Hydrochlorothiazide Other (See Comments)  ? ? ?Vital Signs: ?Temp: 97.8 ?F (36.6 ?C) (03/09 1931) ?Temp Source: Oral (03/09 1931) ?BP: 115/60 (03/10 0130) ?Pulse Rate: 88 (03/10 0130) ? ?Labs: ?Recent Labs  ?  08/27/21 ?1655 08/27/21 ?1708 08/27/21 ?1918  ?HGB 11.3* 11.9*  --   ?HCT 37.3 35.0*  --   ?PLT 163  --   --   ?CREATININE 1.09*  --   --   ?TROPONINIHS 25*  --  29*  ? ? ?CrCl cannot be calculated (Unknown ideal weight.). ? ? ?Medical History: ?Past Medical History:  ?Diagnosis Date  ? Anxiety   ? Arthritis   ? Depression   ? Hypertension   ? Pre-diabetes   ? Stroke [facility])   ? 6 years ago  ? Thyroid disease   ? ? ?Assessment: ?86 y/o F with new onset PE per CT angio. Starting heparin. Hgb 11.9. Renal function good. PTA meds reviewed.  ? ?Goal of Therapy:  ?Heparin level 0.3-0.7 units/ml ?Monitor platelets by anticoagulation protocol: Yes ?  ?Plan:  ?Heparin 4000 units BOLUS ?Start heparin drip at 850 units/hr ?1100 Heparin level ?Daily CBC/Heparin level ?Monitor for bleeding ? ?Abran Duke, PharmD, BCPS ?Clinical Pharmacist ?Phone: [phone] ? ? ? ?

## 2021-08-28 NOTE — ED Notes (Signed)
O2 titrated down to 2 liters [place]. Pt tolerating well ?

## 2021-08-28 NOTE — H&P (Signed)
PATIENT NAME: [name]    MR#:  [id number]  DATE OF BIRTH:  [birth year]  DATE OF ADMISSION:  08/27/2021  PRIMARY CARE PHYSICIAN: Shirlean MylarWebb, Carol, MD   Patient is coming from: Home  REQUESTING/REFERRING PHYSICIAN: Horton, Clabe SealKristie M, DO  CHIEF COMPLAINT:   Chief Complaint  Patient presents with   Altered Mental Status    HISTORY OF PRESENT ILLNESS:  [name] is a 86 y.o. Caucasian female with medical history significant for anxiety, depression, hypertension, CVA and thyroid disease, who presented to the ER with acute onset of altered mental status as well as low-grade fever at home with associated congested cough with inability to expectorate as well as dyspnea.  Patient is not on home O2.  She denies any chest pain or palpitations.  No rhinorrhea or nasal congestion or sore throat.  No nausea or vomiting or abdominal pain.  No dysuria, oliguria, urinary frequency or urgency or flank pain.  No headache or dizziness or blurred vision.  ED Course: In the ER heart rate was 104, respiratory rate 28 with a temperature 101.8.  The patient Pulsoxymeter was 96% on 4 L of O2 by nasal cannula.  Labs revealed VBG with pH 7.41 and PCO2 37.2 and HCO3 25.  CMP revealed a BUN of 28 and creatinine 1.09 with calcium 8.5 and total protein 6.4.  Lactic acid 0.8, BNP 38.9 and high-sensitivity troponin I was 25 and later 29.  CBC showed hemoglobin 11.3 and hematocrit of 37.3 better than previous levels in May of last year.  COVID-19 PCR and influenza antigens are negative.  UA was unremarkable.  2 blood cultures were drawn.  EKG as reviewed by me :  EKG showed sinus tachycardia with a rate of 102 with left atrial enlargement Imaging: Chest x-ray showed no acute cardiopulmonary disease.  Noncontrast head CT scan revealed chronic atrophic and ischemic changes that are stable from prior exam with no acute abnormality noted.  I recommended a chest CTA that came back with: Bilateral pulmonary  emboli without evidence of right heart strain.   Mild left lower lobe consolidation. No pulmonary emboli this may represent early changes of pulmonary infarct.   Cardiomegaly.   Aortic Atherosclerosis  The patient was given 1 g of p.o. Tylenol, 1.5 L bolus of IV normal saline then 75 mill per hour.  She will be admitted to a medical telemetry bed for further evaluation and management. PAST MEDICAL HISTORY:   Past Medical History:  Diagnosis Date   Anxiety    Arthritis    Depression    Hypertension    Pre-diabetes    Stroke (HCC)    6 years ago   Thyroid disease     PAST SURGICAL HISTORY:   Past Surgical History:  Procedure Laterality Date   ABDOMINAL HYSTERECTOMY     CHOLECYSTECTOMY     MULTIPLE TOOTH EXTRACTIONS     ROTATOR CUFF REPAIR Left    TOTAL HIP ARTHROPLASTY Left 10/22/2020   Procedure: LEFT TOTAL HIP ARTHROPLASTY ANTERIOR APPROACH;  Surgeon: Gean Birchwoodowan, Frank, MD;  Location: [facility];  Service: Orthopedics;  Laterality: Left;    SOCIAL HISTORY:   Social History   Tobacco Use   Smoking status: Never   Smokeless tobacco: Never  Substance Use Topics   Alcohol use: Never    FAMILY HISTORY:    Positive for coronary artery disease and cancer.  DRUG ALLERGIES:   Allergies  Allergen Reactions   Donepezil Other (  See Comments)    POSSIBLE (??) Hallucinations and sleep disturbances   Escitalopram Oxalate Other (See Comments)   Hydrochlorothiazide Other (See Comments)    REVIEW OF SYSTEMS:   ROS As per history of present illness. All pertinent systems were reviewed above. Constitutional, HEENT, cardiovascular, respiratory, GI, GU, musculoskeletal, neuro, psychiatric, endocrine, integumentary and hematologic systems were reviewed and are otherwise negative/unremarkable except for positive findings mentioned above in the HPI.   MEDICATIONS AT HOME:   Prior to Admission medications   Medication Sig Start Date End Date Taking? Authorizing Provider  aspirin EC 81  MG tablet Take 1 tablet (81 mg total) by mouth 2 (two) times daily. Patient taking differently: Take 81 mg by mouth once. 10/22/20   Allena Katz, PA-C  Cholecalciferol (VITAMIN D3) 25 MCG (1000 UT) CAPS 1 capsule 05/29/21   [provider]  gabapentin (NEURONTIN) 100 MG capsule Take 1 cap in AM, 2 caps in PM 07/28/21   Wertman, Sung Amabile, PA-C  levothyroxine (SYNTHROID) 50 MCG tablet Take 50 mcg by mouth daily before breakfast.    [provider]  levothyroxine (SYNTHROID) 75 MCG tablet Take by mouth. 05/29/21   [provider]  lisinopril (ZESTRIL) 5 MG tablet Take 5 mg by mouth in the morning.    [provider]  memantine (NAMENDA) 5 MG tablet Take 1 tablet (5 mg total) by mouth in the morning. 07/28/21   Marcos Eke, PA-C  metoprolol succinate (TOPROL-XL) 50 MG 24 hr tablet Take 25 mg by mouth in the morning. 09/21/20   [provider]  OLANZapine (ZYPREXA) 10 MG tablet TAKE 1/2 TABLET IN THE MORNING AND TAKE 1 TABLET AT BEDTIME 07/28/21   Gwynneth Munson, Sung Amabile, PA-C  oxyCODONE-acetaminophen (PERCOCET/ROXICET) 5-325 MG tablet Take 1 tablet by mouth every 4 (four) hours as needed for severe pain. Patient not taking: Reported on 07/28/2021 10/22/20   Allena Katz, PA-C  sertraline (ZOLOFT) 50 MG tablet Take 1 tablet (50 mg total) by mouth daily. 07/28/21   Marcos Eke, PA-C  simvastatin (ZOCOR) 20 MG tablet Take 20 mg by mouth at bedtime.     [provider]  tiZANidine (ZANAFLEX) 2 MG tablet Take 1 tablet (2 mg total) by mouth every 6 (six) hours as needed. 10/22/20   Allena Katz, PA-C  traZODone (DESYREL) 50 MG tablet TAKE 2 TABLETS AT BEDTIME 07/28/21   Marcos Eke, PA-C  vitamin B-12 (CYANOCOBALAMIN) 1000 MCG tablet 1 tablet 05/29/21   [provider]      VITAL SIGNS:  Blood pressure 115/60, pulse 88, temperature 97.8 F (36.6 C), temperature source Oral, resp. rate 20, SpO2 100 %.  PHYSICAL EXAMINATION:  Physical  Exam  GENERAL:  86 y.o.-year-old Caucasian female patient lying in the bed with mild respiratory distress with conversational dyspnea and congested cough. EYES: Pupils equal, round, reactive to light and accommodation. No scleral icterus. Extraocular muscles intact.  HEENT: Head atraumatic, normocephalic. Oropharynx and nasopharynx clear.  NECK:  Supple, no jugular venous distention. No thyroid enlargement, no tenderness.  LUNGS: Diminished bibasilar breath sounds with left basal crackles.  No use of accessory muscles of respiration.  CARDIOVASCULAR: Regular rate and rhythm, S1, S2 normal. No murmurs, rubs, or gallops.  ABDOMEN: Soft, nondistended, nontender. Bowel sounds present. No organomegaly or mass.  EXTREMITIES: No pedal edema, cyanosis, or clubbing.  Negative Homans' sign NEUROLOGIC: Cranial nerves II through XII are intact. Muscle strength 5/5 in all extremities. Sensation intact. Gait not checked.  PSYCHIATRIC: The patient is alert and oriented x 3.  Normal affect and good eye contact. SKIN: No obvious rash, lesion, or ulcer.   LABORATORY PANEL:   CBC Recent Labs  Lab 08/27/21 1655 08/27/21 1708  WBC 6.2  --   HGB 11.3* 11.9*  HCT 37.3 35.0*  PLT 163  --    ------------------------------------------------------------------------------------------------------------------  Chemistries  Recent Labs  Lab 08/27/21 1655 08/27/21 1708  NA 135 135  K 4.3 4.3  CL 102  --   CO2 23  --   GLUCOSE 112*  --   BUN 28*  --   CREATININE 1.09*  --   CALCIUM 8.5*  --   AST 23  --   ALT 16  --   ALKPHOS 46  --   BILITOT 0.6  --    ------------------------------------------------------------------------------------------------------------------  Cardiac Enzymes No results for input(s): TROPONINI in the last 168 hours. ------------------------------------------------------------------------------------------------------------------  RADIOLOGY:  CT Head Wo Contrast  Result  Date: 08/27/2021 CLINICAL DATA:  Altered mental status EXAM: CT HEAD WITHOUT CONTRAST TECHNIQUE: Contiguous axial images were obtained from the base of the skull through the vertex without intravenous contrast. RADIATION DOSE REDUCTION: This exam was performed according to the departmental dose-optimization program which includes automated exposure control, adjustment of the mA and/or kV according to patient size and/or use of iterative reconstruction technique. COMPARISON:  02/16/2020 FINDINGS: Brain: No evidence of acute infarction, hemorrhage, hydrocephalus, extra-axial collection or mass lesion/mass effect. Chronic atrophic and ischemic changes are noted. Vascular: No hyperdense vessel or unexpected calcification. Skull: Normal. Negative for fracture or focal lesion. Sinuses/Orbits: No acute finding. Other: None. IMPRESSION: Chronic atrophic and ischemic changes stable from the prior exam. No acute abnormality noted. Electronically Signed   By: Alcide Clever M.D.   On: 08/27/2021 22:41   CT Angio Chest PE W/Cm &/Or Wo Cm  Result Date: 08/28/2021 CLINICAL DATA:  Cough and altered mental status EXAM: CT ANGIOGRAPHY CHEST WITH CONTRAST TECHNIQUE: Multidetector CT imaging of the chest was performed using the standard protocol during bolus administration of intravenous contrast. Multiplanar CT image reconstructions and MIPs were obtained to evaluate the vascular anatomy. RADIATION DOSE REDUCTION: This exam was performed according to the departmental dose-optimization program which includes automated exposure control, adjustment of the mA and/or kV according to patient size and/or use of iterative reconstruction technique. CONTRAST:  76mL OMNIPAQUE IOHEXOL 350 MG/ML SOLN COMPARISON:  Chest x-ray from the previous day. FINDINGS: Cardiovascular: Thoracic aorta shows a normal branching pattern. Atherosclerotic calcifications are noted without aneurysmal dilatation or dissection. Heart is mildly enlarged in size.  Coronary calcifications are noted. The pulmonary artery shows filling defects throughout both pulmonary arteries but primarily within the lower lobes bilaterally. This is consistent with extensive pulmonary emboli. No evidence of right heart strain is seen. Mediastinum/Nodes: Thoracic inlet is within normal limits. Esophagus is within normal limits. No sizable hilar or mediastinal adenopathy is noted. Lungs/Pleura: Mucous is noted throughout the trachea and bronchial tree bilaterally. Lungs are well aerated bilaterally. Left lower lobe consolidation is noted. No sizable parenchymal nodules seen. Upper Abdomen: Visualized upper abdomen is within normal limits. Musculoskeletal: Degenerative changes of the thoracic spine are noted. No acute rib abnormality is seen. Review of the MIP images confirms the above findings. IMPRESSION: Bilateral pulmonary emboli without evidence of right heart strain. Mild left lower lobe consolidation. No pulmonary emboli this may represent early changes of pulmonary infarct. Cardiomegaly. Aortic Atherosclerosis (ICD10-I70.0). Critical Value/emergent results were called by telephone at the time of interpretation  on 08/28/2021 at 1:28 am to Dr. Clayborne Dana, who verbally acknowledged these results. Electronically Signed   By: Alcide Clever M.D.   On: 08/28/2021 01:32   DG Chest Port 1 View  Result Date: 08/27/2021 CLINICAL DATA:  Cough. EXAM: PORTABLE CHEST 1 VIEW COMPARISON:  Chest x-ray 10/16/2020 FINDINGS: The heart size and mediastinal contours are within normal limits. Both lungs are clear. The visualized skeletal structures are unremarkable. IMPRESSION: No active disease. Electronically Signed   By: Darliss Cheney M.D.   On: 08/27/2021 17:11      IMPRESSION AND PLAN:  Assessment and Plan: * Bilateral pulmonary embolism (HCC) - The patient will be admitted to a medical telemetry bed. - We will place on IV heparin per PE protocol. - We will obtain a 2D echo and bilateral lower  extremity venous Doppler for further assessment. - We will monitor her blood pressure.  It has responded to fluids initially given. - O2 protocol will be followed for associated hypoxemia/acute hypoxic respiratory failure. - We will follow BMP and troponin I for prognostic value.  Sepsis (HCC) - The patient has a left lower lobe consolidation. - While this could be partly related to pulmonary infarct 1 could not rule out community-acquired pneumonia especially given her high fever.  She may meet severe sepsis criteria given initial hypotension. - We will continue her for now on IV Rocephin and Zithromax. - We will follow blood cultures and obtain sputum culture. The patient will be hydrated with IV normal saline.  Hypothyroidism, unspecified - We will continue Synthroid.  Mixed dyslipidemia - We will continue statin therapy.  Essential hypertension - We will continue her antihypertensives.  Depression - We will continue Zoloft.  Dementia with behavioral disturbance - We will continue Namenda and Zyprexa.   DVT prophylaxis: IV heparin Advanced Care Planning:  Code Status: full code. Family Communication:  The plan of care was discussed in details with the patient (and family). I answered all questions. The patient agreed to proceed with the above mentioned plan. Further management will depend upon hospital course. Disposition Plan: Back to previous home environment Consults called:.  None. All the records are reviewed and case discussed with ED provider.  Status is: Inpatient  At the time of the admission, it appears that the appropriate admission status for this patient is inpatient.  This is judged to be reasonable and necessary in order to provide the required intensity of service to ensure the patient's safety given the presenting symptoms, physical exam findings and initial radiographic and laboratory data in the context of comorbid conditions.  The patient requires  inpatient status due to high intensity of service, high risk of further deterioration and high frequency of surveillance required.  I certify that at the time of admission, it is my clinical judgment that the patient will require inpatient hospital care extending more than 2 midnights.                            Dispo: The patient is from: Home              Anticipated d/c is to: Home              Patient currently is not medically stable to d/c.              Difficult to place patient: No  Hannah Beat M.D on 08/28/2021 at 3:20 AM  [facility]   From 7  PM-7 AM, contact night-coverage [email or web address]  CC: Primary care physician; [name], MD

## 2021-08-29 LAB — BASIC METABOLIC PANEL
Anion gap: 8 (ref 5–15)
BUN: 17 mg/dL (ref 8–23)
CO2: 22 mmol/L (ref 22–32)
Calcium: 7.8 mg/dL — ABNORMAL LOW (ref 8.9–10.3)
Chloride: 107 mmol/L (ref 98–111)
Creatinine, Ser: 0.97 mg/dL (ref 0.44–1.00)
GFR, Estimated: 58 mL/min — ABNORMAL LOW (ref >60)
Glucose, Bld: 111 mg/dL — ABNORMAL HIGH (ref 70–99)
Potassium: 3.8 mmol/L (ref 3.5–5.1)
Sodium: 137 mmol/L (ref 135–145)

## 2021-08-29 LAB — CBC
HCT: 30.4 % — ABNORMAL LOW (ref 36.0–46.0)
Hemoglobin: 9.4 g/dL — ABNORMAL LOW (ref 12.0–15.0)
MCH: 26.9 pg (ref 26.0–34.0)
MCHC: 30.9 g/dL (ref 30.0–36.0)
MCV: 87.1 fL (ref 80.0–100.0)
Platelets: 160 10*3/uL (ref 150–400)
RBC: 3.49 MIL/uL — ABNORMAL LOW (ref 3.87–5.11)
RDW: 15.2 % (ref 11.5–15.5)
WBC: 7.7 10*3/uL (ref 4.0–10.5)
nRBC: 0 % (ref 0.0–0.2)

## 2021-08-29 MED ORDER — OLANZAPINE 5 MG PO TABS
5.0000 mg | ORAL_TABLET | Freq: Every day | ORAL | Status: DC
  Administered 2021-08-29 – 2021-08-31 (×3): 5 mg via ORAL
  Filled 2021-08-29 (×4): qty 1

## 2021-08-29 MED ORDER — AZITHROMYCIN 500 MG PO TABS
500.0000 mg | ORAL_TABLET | Freq: Every day | ORAL | Status: DC
  Administered 2021-08-30 – 2021-08-31 (×2): 500 mg via ORAL
  Filled 2021-08-29 (×2): qty 1

## 2021-08-29 MED ORDER — OLANZAPINE 5 MG PO TABS
10.0000 mg | ORAL_TABLET | Freq: Every day | ORAL | Status: DC
  Administered 2021-08-29 – 2021-08-30 (×2): 10 mg via ORAL
  Filled 2021-08-29 (×2): qty 2

## 2021-08-29 MED ORDER — LEVOTHYROXINE SODIUM 50 MCG PO TABS
50.0000 ug | ORAL_TABLET | Freq: Every day | ORAL | Status: DC
  Administered 2021-08-30 – 2021-08-31 (×2): 50 ug via ORAL
  Filled 2021-08-29 (×2): qty 1

## 2021-08-29 NOTE — Progress Notes (Signed)
Occupational Therapy Evaluation ?Patient Details ?Name: [name] ?MRN: [id number] ?DOB: 10/17/1935 ?Today's Date: 08/29/2021 ? ? ?History of Present Illness Pt is an 86 year old female with past medical history significant of dementia, anxiety, HTN, previous CVA presents to the  [facility] with concerns of change in mental status.  chest CTA results of Bilateral pulmonary emboli without evidence of right heart strain.  ? ?Clinical Impression ?  ?Pt presents with deficits listed above. Pt cleared for participation in evaluations and within appropriate therapeutic heparin levels of 0.48. Per conversation with granddaughter, whom is pt's caregiver, pt lives in a 2 level house and required mod I to max A with ADLs such as bathing, self dressing, grooming, caregiver assists with IADls, driving and medication management. Caregiver reports AE of stair lift, walk in shower with seat and handicap toilet with access to RW (use of main level). Pt received pleasantly confused and slightly lethargic but able to arouse with verbal stim. Session limited to bed level evaluation due to drop in BP (see details below) addressed with RN and MD. Pt required mod A to max A for rolling B sides. Pt will benefit with continued skilled level OT to address established deficits and for further assessment to improve independence with ADLs and to relieve caregiver burden. DC recommendation to SNF, dependant upon further assessment once appropriate for OOB activities.   ?   ? ?Recommendations for follow up therapy are one component of a multi-disciplinary discharge planning process, led by the attending physician.  Recommendations may be updated based on patient status, additional functional criteria and insurance authorization.  ? ?Follow Up Recommendations ? Skilled nursing-short term rehab (<3 hours/day)  ?  ?Assistance Recommended at Discharge Frequent or constant Supervision/Assistance  ?Patient can return home with the following A lot of help  with bathing/dressing/bathroom ? ?  ?Functional Status Assessment ?    ?Equipment Recommendations ?  (Defer to post acute rehab setting.)  ?  ?Recommendations for Other Services   ? ? ?  ?Precautions / Restrictions Precautions ?Precautions: Fall ?Restrictions ?Weight Bearing Restrictions: No  ? ?  ? ?Mobility Bed Mobility ?Overal bed mobility: Needs Assistance ?Bed Mobility: Rolling ?Rolling: Mod assist, Max assist ?  ?  ?  ?  ?General bed mobility comments: Pt given cues for rolling B sides, increased assistance needed rolling to R>L. Heavy cueing for safe hand placement. Use of bed pad to rotate trunk and pelvis. ?  ? ?Transfers ?  ?  ?  ?  ?  ?  ?  ?  ?  ?General transfer comment: NT due to drop in BP. ?  ? ?  ?Balance Overall balance assessment:  (Will need to further assess.) ?  ?  ?  ?  ?  ?  ?  ?  ?  ?  ?  ?  ?  ?  ?  ?  ?  ?  ?   ? ?ADL either performed or assessed with clinical judgement  ? ?ADL Overall ADL's : Needs assistance/impaired ?  ?  ?  ?  ?  ?  ?  ?  ?  ?  ?  ?  ?  ?  ?Toileting- Clothing Manipulation and Hygiene: Total assistance ?Toileting - Clothing Manipulation Details (indicate cue type and reason): received with periwick. ?  ?  ?  ?General ADL Comments: evaluation limited due to drop in BP, addressed with RN. Bed level eval administered. BP assessed 91/48 reclined sitting, 95/48 supine, and 94/47 semi reclined.  OT informed MD while in room and pt left with MD.  ? ? ? ?Vision   ?   ?   ?Perception   ?  ?Praxis   ?  ? ?Pertinent Vitals/Pain Pain Assessment ?Pain Assessment: Faces ?Faces Pain Scale: No hurt  ? ? ? ?Hand Dominance Right ?  ?Extremity/Trunk Assessment Upper Extremity Assessment ?Upper Extremity Assessment: Generalized weakness;LUE deficits/detail ?LUE Deficits / Details: PROM ~0-90 deg shd flexion with an end feel ?  ?Lower Extremity Assessment ?Lower Extremity Assessment: Defer to PT evaluation ?  ?  ?  ?Communication Communication ?Communication: HOH ?  ?Cognition  Arousal/Alertness: Lethargic ?Behavior During Therapy: Restless ?Overall Cognitive Status: Impaired/Different from baseline ?Area of Impairment: Orientation, Attention ?  ?  ?  ?  ?  ?  ?  ?  ?Orientation Level: Disoriented to, Place, Time, Situation ?Current Attention Level:  (Pt required redirection through out evaluation.) ?  ?  ?  ?  ?  ?  ?  ?  ?General Comments  Pt received on 3L O2, O2 > 90% throughout session. BP levels dropped at bed level.  (defer to ADL comment section) ? ?  ?Exercises   ?  ?Shoulder Instructions    ? ? ?Home Living Family/patient expects to be discharged to:: Private residence ?Living Arrangements: Other (Comment) (granddaughter and daughter.) ?Available Help at Discharge: Family ?Type of Home: House ?Home Access: Stairs to enter ?Entrance Stairs-Number of Steps: 1 ?Entrance Stairs-Rails: None ?Home Layout: Two level;Bed/bath upstairs ?  ?  ?Bathroom Shower/Tub: Walk-in shower ?  ?Bathroom Toilet: Handicapped height ?Bathroom Accessibility: Yes ?  ?Home Equipment: Agricultural consultant (2 wheels);Cane - single point (stair lift, eletric recliner (not a lift recliner)) ?  ?Additional Comments: Per conversation with granddaughter, caregiver of pt and assists with ADLs such as bathing. ?  ? ?  ?Prior Functioning/Environment Prior Level of Function : Needs assist ?  ?  ?  ?Physical Assist : ADLs (physical) ?  ?ADLs (physical): Bathing ?Mobility Comments: Granddaughter reports pt ambulated with AE occasional, RW use on 1st level. ?ADLs Comments: grand daughter assisting with bathing, pt able to perform dressing and grooming I to mod I. ?  ? ?  ?  ?OT Problem List: Decreased strength;Decreased range of motion;Impaired balance (sitting and/or standing);Decreased activity tolerance;Decreased cognition;Decreased safety awareness ?  ?   ?OT Treatment/Interventions: Self-care/ADL training;Therapeutic exercise;Cognitive remediation/compensation;Therapeutic activities;Patient/family education  ?  ?OT  Goals(Current goals can be found in the care plan section) Acute Rehab OT Goals ?Patient Stated Goal: none stated ?OT Goal Formulation: With family ?Time For Goal Achievement: 09/12/21 ?Potential to Achieve Goals: Fair  ?OT Frequency: Min 2X/week ?  ? ?Co-evaluation   ?  ?  ?  ?  ? ?  ?AM-PAC OT "6 Clicks" Daily Activity     ?Outcome Measure Help from another person eating meals?: A Little ?Help from another person taking care of personal grooming?: A Little ?Help from another person toileting, which includes using toliet, bedpan, or urinal?: Total ?Help from another person bathing (including washing, rinsing, drying)?: A Lot ?Help from another person to put on and taking off regular upper body clothing?: A Lot ?Help from another person to put on and taking off regular lower body clothing?: A Lot ?6 Click Score: 13 ?  ?End of Session Equipment Utilized During Treatment: Oxygen ?Nurse Communication: Mobility status;Other (comment) (concerns of BP.) ? ?Activity Tolerance: Patient limited by fatigue ?Patient left: in bed;with call bell/phone within reach;with bed alarm set;Other (comment) (Left with  MD.) ? ?OT Visit Diagnosis: Muscle weakness (generalized) (M62.81);Cognitive communication deficit (R41.841)  ?              ?Time: [id number] ?OT Time Calculation (min): 31 min ?Charges:  OT General Charges ?$OT Visit: 1 Visit ?OT Evaluation ?$OT Eval Low Complexity: 1 Low ? ?Marquette OldEvan Osha Rane, MSOT, OTR/L ? ?Supplemental Rehabilitation Services ? ?[phone]' ? ?Zigmund DanielEvan M Rocklyn Mayberry ?08/29/2021, 1:05 PM ?

## 2021-08-29 NOTE — Plan of Care (Signed)
  Problem: Activity: Goal: Risk for activity intolerance will decrease Outcome: Progressing   

## 2021-08-29 NOTE — Progress Notes (Signed)
Patient was very mellow this morning, so I attempted trial of no hand mitts until now.  Set patient up with her breakfast of french toast (cut into small pieces) and oatmeal and checked later to find patient finished everything on the tray.   ? ?Patient currently agitated and pulling at lines, as well as trying to get out of bed.  I tried reorienting patient without success, so I decided to put mitts back on patient.  Nicky Pugh, RN assisted with mitt re-application since patient was irritated and swatting at me.   ?Attempted feeding patient lunch, but she said she was not hungry.  Tried again another hour later and she still was not interested, stating "I just ate not long ago."  I told her it was a few hours, but she said she wanted to wait.  Will plan to set patient up with dinner and monitor.   ?

## 2021-08-29 NOTE — Evaluation (Signed)
Physical Therapy Evaluation ?Patient Details ?Name: [name] ?MRN: [id number] ?DOB: 10/12/1935 ?Today's Date: 08/29/2021 ? ?History of Present Illness ? 86 y.o. female presents to [facility] hospital on 08/27/2021 with AMS, fever, cough, and dyspnea. Pt found to have bilateral PE on chest CTA, as well as PNA. PMH includes anxiety, depression, hypertension, CVA and thyroid disease.  ?Clinical Impression ? Pt presents to PT with deficits in functional mobility, gait, balance, endurance, cognition. Pt with history of dementia and no family present so baseline is difficult to determine. Pt is hallucinating during session, reports seeing a woman in her room, as well as children running outside the window earlier in the day. Pt requires assistance to stand and maintain balance due to posterior lean. Pt will benefit from continued mobilization in an effort to reduce falls risk and improve activity tolerance.   ?   ? ?Recommendations for follow up therapy are one component of a multi-disciplinary discharge planning process, led by the attending physician.  Recommendations may be updated based on patient status, additional functional criteria and insurance authorization. ? ?Follow Up Recommendations Skilled nursing-short term rehab (<3 hours/day) ? ?  ?Assistance Recommended at Discharge Frequent or constant Supervision/Assistance  ?Patient can return home with the following ?   ? ?  ?Equipment Recommendations  (TBD)  ?Recommendations for Other Services ?    ?  ?Functional Status Assessment Patient has had a recent decline in their functional status and demonstrates the ability to make significant improvements in function in a reasonable and predictable amount of time.  ? ?  ?Precautions / Restrictions Precautions ?Precautions: Fall ?Precaution Comments: monitor HR and SpO2 ?Restrictions ?Weight Bearing Restrictions: No  ? ?  ? ?Mobility ? Bed Mobility ?Overal bed mobility: Needs Assistance ?Bed Mobility: Supine to Sit, Sit to  Supine ?  ?  ?Supine to sit: Min assist, HOB elevated ?Sit to supine: Min guard ?  ?  ?  ? ?Transfers ?Overall transfer level: Needs assistance ?Equipment used: 1 person hand held assist ?Transfers: Sit to/from Stand ?Sit to Stand: Min assist ?  ?  ?  ?  ?  ?General transfer comment: posterior lean ?  ? ?Ambulation/Gait ?Ambulation/Gait assistance:  (deferred due to imbalance) ?  ?  ?  ?  ?  ?  ?  ? ?Stairs ?  ?  ?  ?  ?  ? ?Wheelchair Mobility ?  ? ?Modified Rankin (Stroke Patients Only) ?  ? ?  ? ?Balance Overall balance assessment: Needs assistance ?Sitting-balance support: No upper extremity supported, Feet supported ?Sitting balance-Leahy Scale: Fair ?  ?  ?Standing balance support: Single extremity supported ?Standing balance-Leahy Scale: Poor ?Standing balance comment: posterior lean, only tolerates standing for brief periods ?  ?  ?  ?  ?  ?  ?  ?  ?  ?  ?  ?   ? ? ? ?Pertinent Vitals/Pain Pain Assessment ?Pain Assessment: No/denies pain  ? ? ?Home Living Family/patient expects to be discharged to:: Private residence ?Living Arrangements: Other relatives (dtr and gdtr) ?Available Help at Discharge: Family ?Type of Home: House ?Home Access: Stairs to enter ?Entrance Stairs-Rails: None ?Entrance Stairs-Number of Steps: 1 ?  ?Home Layout: Two level;Bed/bath upstairs ?Home Equipment: Agricultural consultant (2 wheels);Cane - single point ?Additional Comments: stair lift  ?  ?Prior Function Prior Level of Function : Needs assist (history obtained from OT note as no family present and pt altered) ?  ?  ?  ?  ?  ?  ?  Mobility Comments: Granddaughter reports pt ambulated with AE occasional, RW use on 1st level. ?ADLs Comments: grand daughter assisting with bathing, pt able to perform dressing and grooming I to mod I. ?  ? ? ?Hand Dominance  ? Dominant Hand: Right ? ?  ?Extremity/Trunk Assessment  ? Upper Extremity Assessment ?Upper Extremity Assessment: Generalized weakness ?  ? ?Lower Extremity Assessment ?Lower Extremity  Assessment: Generalized weakness ?  ? ?Cervical / Trunk Assessment ?Cervical / Trunk Assessment: Kyphotic  ?Communication  ? Communication: No difficulties  ?Cognition Arousal/Alertness: Awake/alert ?Behavior During Therapy: [facility] for tasks assessed/performed ?Overall Cognitive Status: Impaired/Different from baseline ?Area of Impairment: Orientation, Attention, Following commands, Memory, Safety/judgement, Awareness, Problem solving ?  ?  ?  ?  ?  ?  ?  ?  ?Orientation Level: Disoriented to, Place, Time, Situation ?Current Attention Level: Focused ?Memory: Decreased short-term memory ?Following Commands: Follows one step commands consistently ?Safety/Judgement: Decreased awareness of safety, Decreased awareness of deficits ?Awareness: Intellectual ?Problem Solving: Requires verbal cues ?General Comments: pt hallucinating another woman in her room, reports the lunch tray belongs to her ?  ?  ? ?  ?General Comments General comments (skin integrity, edema, etc.): pt on 3L [place], VSS during session ? ?  ?Exercises    ? ?Assessment/Plan  ?  ?PT Assessment Patient needs continued PT services  ?PT Problem List Decreased strength;Decreased activity tolerance;Decreased balance;Decreased mobility;Decreased knowledge of use of DME;Decreased cognition;Decreased safety awareness;Decreased knowledge of precautions;Cardiopulmonary status limiting activity ? ?   ?  ?PT Treatment Interventions DME instruction;Gait training;Functional mobility training;Therapeutic activities;Therapeutic exercise;Balance training;Neuromuscular re-education;Stair training;Patient/family education   ? ?PT Goals (Current goals can be found in the Care Plan section)  ?Acute Rehab PT Goals ?Patient Stated Goal: to go home ?PT Goal Formulation: With patient ?Time For Goal Achievement: 09/12/21 ?Potential to Achieve Goals: Fair ? ?  ?Frequency Min 3X/week (may progress quickly) ?  ? ? ?Co-evaluation   ?  ?  ?  ?  ? ? ?  ?AM-PAC PT "6 Clicks" Mobility  ?Outcome  Measure Help needed turning from your back to your side while in a flat bed without using bedrails?: A Little ?Help needed moving from lying on your back to sitting on the side of a flat bed without using bedrails?: A Little ?Help needed moving to and from a bed to a chair (including a wheelchair)?: A Little ?Help needed standing up from a chair using your arms (e.g., wheelchair or bedside chair)?: A Little ?Help needed to walk in hospital room?: Total ?Help needed climbing 3-5 steps with a railing? : Total ?6 Click Score: 14 ? ?  ?End of Session Equipment Utilized During Treatment: Oxygen ?Activity Tolerance: Patient tolerated treatment well ?Patient left: in bed;with call bell/phone within reach;with bed alarm set (mitts reapplied) ?Nurse Communication: Mobility status ?PT Visit Diagnosis: Other abnormalities of gait and mobility (R26.89);Muscle weakness (generalized) (M62.81) ?  ? ?Time: 2202-5427 ?PT Time Calculation (min) (ACUTE ONLY): 16 min ? ? ?Charges:   PT Evaluation ?$PT Eval Low Complexity: 1 Low ?  ?  ?   ? ? ?Arlyss Gandy, PT, DPT ?Acute Rehabilitation ?Pager: [phone] ?Office [phone] ? ? ?Arlyss Gandy ?08/29/2021, 5:14 PM ? ?

## 2021-08-29 NOTE — Progress Notes (Addendum)
PROGRESS NOTE    [name]  NTZ:[id number] DOB: 12-Sep-1935 DOA: 08/27/2021 PCP: Maurice Small, MD   Chief Complaint  Patient presents with   Altered Mental Status    Brief Narrative:   [name] is a 86 y.o. Caucasian female with medical history significant for anxiety, depression, hypertension, CVA and thyroid disease, who presented to the ER with acute onset of altered mental status as well as low-grade fever at home with associated congested cough with inability to expectorate as well as dyspnea.  Patient is not on home O2.  She denies any chest pain or palpitations.  Her work-up is significant for bilateral PE and pneumonia.    Assessment & Plan:   Principal Problem:   Bilateral pulmonary embolism (HCC) Active Problems:   Sepsis ([place])   Dementia with behavioral disturbance   Depression   Essential hypertension   Mixed dyslipidemia   Hypothyroidism, unspecified  Bilateral pulmonary embolism (HCC)/ acute DVT RLL -CTA significant for extensive bilateral PE, and Doppler significant for acute right lower extremity DVT -On heparin GTT, currently transition to oral Lovenox, will change to Eliquis when stable.   -2D echo with a preserved EF, no evidence of right heart strain, but significant for increased pulmonary artery pressure   Sepsis ([place]) present on admission -Patient met sepsis criteria given hypotension, tachypnea, tachycardia and fever -Related to pneumonia -Continue with IV Rocephin and azithromycin.  Altered mental status -Patient is lethargic this morning. -ABG with no evidence of CO2 retention. -CT head with no acute findings. -Initially held her sedative medications due to lethargy, but later in the day she was confused, she was resumed back on home dose gabapentin and Zyprexa, mentation back to baseline.   Hypothyroidism, unspecified - We will continue Synthroid.   Mixed dyslipidemia - We will continue statin therapy.   Essential hypertension - Blood  pressure is soft with PT today, will hold lisinopril and continue with Toprol-XL.   Depression - We will continue Zoloft.   Dementia with behavioral disturbance - We will continue Namenda  -Continue with Zyprexa and home medications    DVT prophylaxis: Heparin GTT>> lovenox Code Status: [place] Family Communication: none at bedside, D/W granddaughter by phone Disposition: PT/OT recommends subacute rehab, but as discussed with the granddaughter she wishes for patient to go home with home health with is very appropriate given patient age and risk of dementia and unfamiliar places  Status is: Inpatient Remains inpatient appropriate because: iv heparin and ABX   Consultants:  None   Subjective:  With an episode of confusion daytime yesterday, no recurrent events overnight, blood pressure was soft while working with OT today.     Objective: Vitals:   08/28/21 2348 08/29/21 0400 08/29/21 0755 08/29/21 1157  BP: (!) 140/99  (!) 111/54   Pulse:      Resp: 16  18   Temp: 98.3 F (36.8 C) 98.3 F (36.8 C) 97.9 F (36.6 C) 98.5 F (36.9 C)  TempSrc: Axillary Axillary Axillary Axillary  SpO2:      Weight:      Height:        Intake/Output Summary (Last 24 hours) at 08/29/2021 1351 Last data filed at 08/29/2021 0543 Gross per 24 hour  Intake 1237.87 ml  Output 400 ml  Net 837.87 ml   Filed Weights   08/28/21 2020  Weight: 74.4 kg    Examination:  Awake, pleasant, follows simple commands, but she is demented and confused.   Symmetrical Chest wall movement,  Good air movement bilaterally, CTAB RRR,No Gallops,Rubs or new Murmurs, No Parasternal Heave +ve B.Sounds, Abd Soft, No tenderness, No rebound - guarding or rigidity. No Cyanosis, Clubbing or edema, No new Rash or bruise       Data Reviewed: I have personally reviewed following labs and imaging studies  CBC: Recent Labs  Lab 08/27/21 1655 08/27/21 1708 08/28/21 0426 08/28/21 0839 08/29/21 0102  WBC 6.2   --  6.4  --  7.7  NEUTROABS 5.3  --   --   --   --   HGB 11.3* 11.9* 9.5* 9.5* 9.4*  HCT 37.3 35.0* 30.9* 28.0* 30.4*  MCV 89.2  --  89.0  --  87.1  PLT 163  --  142*  --  034    Basic Metabolic Panel: Recent Labs  Lab 08/27/21 1655 08/27/21 1708 08/28/21 0426 08/28/21 0839 08/29/21 0102  NA 135 135 136 139 137  K 4.3 4.3 3.7 3.8 3.8  CL 102  --  107  --  107  CO2 23  --  22  --  22  GLUCOSE 112*  --  104*  --  111*  BUN 28*  --  24*  --  17  CREATININE 1.09*  --  0.94  --  0.97  CALCIUM 8.5*  --  7.2*  --  7.8*    GFR: Estimated Creatinine Clearance: 44.5 mL/min (by C-G formula based on SCr of 0.97 mg/dL).  Liver Function Tests: Recent Labs  Lab 08/27/21 1655  AST 23  ALT 16  ALKPHOS 46  BILITOT 0.6  PROT 6.4*  ALBUMIN 3.5    CBG: Recent Labs  Lab 08/27/21 1656 08/28/21 1633  GLUCAP 105* 111*     Recent Results (from the past 240 hour(s))  Resp Panel by RT-PCR (Flu A&B, Covid) Nasopharyngeal Swab     Status: None   Collection Time: 08/27/21  4:56 PM   Specimen: Nasopharyngeal Swab; Nasopharyngeal(NP) swabs in vial transport medium  Result Value Ref Range Status   SARS Coronavirus 2 by RT PCR NEGATIVE NEGATIVE Final    Comment: (NOTE) SARS-CoV-2 target nucleic acids are NOT DETECTED.  The SARS-CoV-2 RNA is generally detectable in upper respiratory specimens during the acute phase of infection. The lowest concentration of SARS-CoV-2 viral copies this assay can detect is 138 copies/mL. A negative result does not preclude SARS-Cov-2 infection and should not be used as the sole basis for treatment or other patient management decisions. A negative result may occur with  improper specimen collection/handling, submission of specimen other than nasopharyngeal swab, presence of viral mutation(s) within the areas targeted by this assay, and inadequate number of viral copies(<138 copies/mL). A negative result must be combined with clinical observations,  patient history, and epidemiological information. The expected result is Negative.  Fact Sheet for Patients:  EntrepreneurPulse.com.au  Fact Sheet for Healthcare Providers:  IncredibleEmployment.be  This test is no t yet approved or cleared by the [place] FDA and  has been authorized for detection and/or diagnosis of SARS-CoV-2 by FDA under an Emergency Use Authorization (EUA). This EUA will remain  in effect (meaning this test can be used) for the duration of the COVID-19 declaration under Section 564(b)(1) of the Act, 21 U.S.C.section 360bbb-3(b)(1), unless the authorization is terminated  or revoked sooner.       Influenza A by PCR NEGATIVE NEGATIVE Final   Influenza B by PCR NEGATIVE NEGATIVE Final    Comment: (NOTE) The Xpert Xpress SARS-CoV-2/FLU/RSV plus assay is intended  as an aid in the diagnosis of influenza from Nasopharyngeal swab specimens and should not be used as a sole basis for treatment. Nasal washings and aspirates are unacceptable for Xpert Xpress SARS-CoV-2/FLU/RSV testing.  Fact Sheet for Patients: EntrepreneurPulse.com.au  Fact Sheet for Healthcare Providers: IncredibleEmployment.be  This test is not yet approved or cleared by the [place] FDA and has been authorized for detection and/or diagnosis of SARS-CoV-2 by FDA under an Emergency Use Authorization (EUA). This EUA will remain in effect (meaning this test can be used) for the duration of the COVID-19 declaration under Section 564(b)(1) of the Act, 21 U.S.C. section 360bbb-3(b)(1), unless the authorization is terminated or revoked.  Performed at [facility], [place] [address], [place], [place] [address]   Culture, blood (routine x 2)     Status: None (Preliminary result)   Collection Time: 08/27/21  5:03 PM   Specimen: BLOOD  Result Value Ref Range Status   Specimen Description BLOOD BLOOD RIGHT FOREARM   Final   Special Requests   Final    BOTTLES DRAWN AEROBIC AND ANAEROBIC Blood Culture adequate volume   Culture   Final    NO GROWTH 2 DAYS Performed at [facility], [place] [address], [place], [place] [address]    Report Status PENDING  Incomplete  Culture, blood (routine x 2)     Status: None (Preliminary result)   Collection Time: 08/27/21  5:20 PM   Specimen: BLOOD LEFT ARM  Result Value Ref Range Status   Specimen Description BLOOD LEFT ARM  Final   Special Requests   Final    BOTTLES DRAWN AEROBIC AND ANAEROBIC Blood Culture results may not be optimal due to an inadequate volume of blood received in culture bottles   Culture   Final    NO GROWTH 2 DAYS Performed at [facility], [place] [address], [place], [place] [address]    Report Status PENDING  Incomplete         Radiology Studies: CT Head Wo Contrast  Result Date: 08/27/2021 CLINICAL DATA:  Altered mental status EXAM: CT HEAD WITHOUT CONTRAST TECHNIQUE: Contiguous axial images were obtained from the base of the skull through the vertex without intravenous contrast. RADIATION DOSE REDUCTION: This exam was performed according to the departmental dose-optimization program which includes automated exposure control, adjustment of the mA and/or kV according to patient size and/or use of iterative reconstruction technique. COMPARISON:  02/16/2020 FINDINGS: Brain: No evidence of acute infarction, hemorrhage, hydrocephalus, extra-axial collection or mass lesion/mass effect. Chronic atrophic and ischemic changes are noted. Vascular: No hyperdense vessel or unexpected calcification. Skull: Normal. Negative for fracture or focal lesion. Sinuses/Orbits: No acute finding. Other: None. IMPRESSION: Chronic atrophic and ischemic changes stable from the prior exam. No acute abnormality noted. Electronically Signed   By: Inez Catalina M.D.   On: 08/27/2021 22:41   CT Angio Chest PE W/Cm &/Or Wo Cm  Result Date: 08/28/2021 CLINICAL  DATA:  Cough and altered mental status EXAM: CT ANGIOGRAPHY CHEST WITH CONTRAST TECHNIQUE: Multidetector CT imaging of the chest was performed using the standard protocol during bolus administration of intravenous contrast. Multiplanar CT image reconstructions and MIPs were obtained to evaluate the vascular anatomy. RADIATION DOSE REDUCTION: This exam was performed according to the departmental dose-optimization program which includes automated exposure control, adjustment of the mA and/or kV according to patient size and/or use of iterative reconstruction technique. CONTRAST:  50m OMNIPAQUE IOHEXOL 350 MG/ML SOLN COMPARISON:  Chest x-ray from  the previous day. FINDINGS: Cardiovascular: Thoracic aorta shows a normal branching pattern. Atherosclerotic calcifications are noted without aneurysmal dilatation or dissection. Heart is mildly enlarged in size. Coronary calcifications are noted. The pulmonary artery shows filling defects throughout both pulmonary arteries but primarily within the lower lobes bilaterally. This is consistent with extensive pulmonary emboli. No evidence of right heart strain is seen. Mediastinum/Nodes: Thoracic inlet is within normal limits. Esophagus is within normal limits. No sizable hilar or mediastinal adenopathy is noted. Lungs/Pleura: Mucous is noted throughout the trachea and bronchial tree bilaterally. Lungs are well aerated bilaterally. Left lower lobe consolidation is noted. No sizable parenchymal nodules seen. Upper Abdomen: Visualized upper abdomen is within normal limits. Musculoskeletal: Degenerative changes of the thoracic spine are noted. No acute rib abnormality is seen. Review of the MIP images confirms the above findings. IMPRESSION: Bilateral pulmonary emboli without evidence of right heart strain. Mild left lower lobe consolidation. No pulmonary emboli this may represent early changes of pulmonary infarct. Cardiomegaly. Aortic Atherosclerosis (ICD10-I70.0). Critical  Value/emergent results were called by telephone at the time of interpretation on 08/28/2021 at 1:28 am to Dr. Dayna Barker, who verbally acknowledged these results. Electronically Signed   By: Inez Catalina M.D.   On: 08/28/2021 01:32   DG Chest Port 1 View  Result Date: 08/27/2021 CLINICAL DATA:  Cough. EXAM: PORTABLE CHEST 1 VIEW COMPARISON:  Chest x-ray 10/16/2020 FINDINGS: The heart size and mediastinal contours are within normal limits. Both lungs are clear. The visualized skeletal structures are unremarkable. IMPRESSION: No active disease. Electronically Signed   By: Ronney Asters M.D.   On: 08/27/2021 17:11   ECHOCARDIOGRAM COMPLETE  Result Date: 08/28/2021    ECHOCARDIOGRAM REPORT   Patient Name:   BRE PECINA Date of Exam: 08/28/2021 Medical Rec #:  [id number]     Height:       66.0 in Accession #:    [id number]    Weight:       142.0 lb Date of Birth:  1936/04/14     BSA:          1.729 m Patient Age:    48 years      BP:           114/56 mmHg Patient Gender: F             HR:           75 bpm. Exam Location:  Inpatient Procedure: 2D Echo Indications:    Pulmonary embolism  History:        Patient has no prior history of Echocardiogram examinations.                 Risk Factors:Hypertension.  Sonographer:    Jefferey Pica Referring Phys: [id number] JAN A MANSY  Sonographer Comments: Image acquisition challenging due to respiratory motion. IMPRESSIONS  1. Left ventricular ejection fraction, by estimation, is 55 to 60%. The left ventricle has normal function. The left ventricle has no regional wall motion abnormalities. There is mild concentric left ventricular hypertrophy. Left ventricular diastolic parameters are consistent with Grade II diastolic dysfunction (pseudonormalization). Elevated left atrial pressure.  2. Right ventricular systolic function is normal. The right ventricular size is normal. There is severely elevated pulmonary artery systolic pressure. The estimated right ventricular systolic  pressure is 75.6 mmHg.  3. The mitral valve is degenerative. Trivial mitral valve regurgitation. Mild mitral stenosis. The mean mitral valve gradient is 5.0 mmHg with average heart rate of 79 bpm. MVA by Continuity equation  1.51 cm2.  4. Tricuspid valve regurgitation is moderate.  5. The aortic valve is calcified. Aortic valve regurgitation is not visualized. Aortic valve sclerosis is present, with no evidence of aortic valve stenosis. Visually calcified, DVI 0.64, mean gradient 8 mm Hg.  6. Left atrial size was mildly dilated.  7. Right atrial size was mildly dilated.  8. The inferior vena cava is dilated in size with <50% respiratory variability, suggesting right atrial pressure of 15 mmHg. Comparison(s): No prior Echocardiogram. FINDINGS  Left Ventricle: Left ventricular ejection fraction, by estimation, is 55 to 60%. The left ventricle has normal function. The left ventricle has no regional wall motion abnormalities. The left ventricular internal cavity size was normal in size. There is  mild concentric left ventricular hypertrophy. Left ventricular diastolic parameters are consistent with Grade II diastolic dysfunction (pseudonormalization). Elevated left atrial pressure. Right Ventricle: The right ventricular size is normal. No increase in right ventricular wall thickness. Right ventricular systolic function is normal. There is severely elevated pulmonary artery systolic pressure. The tricuspid regurgitant velocity is 3.73 m/s, and with an assumed right atrial pressure of 15 mmHg, the estimated right ventricular systolic pressure is 30.0 mmHg. Left Atrium: Left atrial size was mildly dilated. Right Atrium: Right atrial size was mildly dilated. Pericardium: Trivial pericardial effusion is present. Mitral Valve: The mitral valve is degenerative in appearance. Trivial mitral valve regurgitation. Mild mitral valve stenosis. The mean mitral valve gradient is 5.0 mmHg with average heart rate of 79 bpm. Tricuspid  Valve: The tricuspid valve is normal in structure. Tricuspid valve regurgitation is moderate . No evidence of tricuspid stenosis. Aortic Valve: The aortic valve is calcified. Aortic valve regurgitation is not visualized. Aortic valve sclerosis is present, with no evidence of aortic valve stenosis. Aortic valve peak gradient measures 15.6 mmHg. Pulmonic Valve: The pulmonic valve was not well visualized. Pulmonic valve regurgitation is not visualized. No evidence of pulmonic stenosis. Aorta: The aortic root is normal in size and structure. Venous: The inferior vena cava is dilated in size with less than 50% respiratory variability, suggesting right atrial pressure of 15 mmHg. IAS/Shunts: No atrial level shunt detected by color flow Doppler.  LEFT VENTRICLE PLAX 2D LVIDd:         4.00 cm   Diastology LVIDs:         2.85 cm   LV e' medial:    5.27 cm/s LV PW:         1.10 cm   LV E/e' medial:  23.5 LV IVS:        1.10 cm   LV e' lateral:   3.81 cm/s LVOT diam:     2.00 cm   LV E/e' lateral: 32.5 LV SV:         86 LV SV Index:   50 LVOT Area:     3.14 cm  RIGHT VENTRICLE             IVC RV Basal diam:  2.90 cm     IVC diam: 3.10 cm RV S prime:     15.60 cm/s TAPSE (M-mode): 2.5 cm LEFT ATRIUM             Index        RIGHT ATRIUM           Index LA diam:        3.30 cm 1.91 cm/m   RA Area:     18.85 cm LA Vol (A2C):   58.6 ml 33.89 ml/m  RA Volume:   57.55 ml  33.29 ml/m LA Vol (A4C):   42.6 ml 24.64 ml/m LA Biplane Vol: 50.8 ml 29.38 ml/m  AORTIC VALVE                 PULMONIC VALVE AV Area (Vmax): 1.88 cm     PV Vmax:       1.01 m/s AV Vmax:        197.50 cm/s  PV Peak grad:  4.1 mmHg AV Peak Grad:   15.6 mmHg LVOT Vmax:      118.00 cm/s LVOT Vmean:     68.900 cm/s LVOT VTI:       0.273 m  AORTA Ao Root diam: 3.60 cm MITRAL VALVE                TRICUSPID VALVE MV Area (PHT): 2.44 cm     TR Peak grad:   55.7 mmHg MV Mean grad:  5.0 mmHg     TR Vmax:        373.00 cm/s MV Decel Time: 311 msec MV E velocity:  124.00 cm/s  SHUNTS MV A velocity: 192.00 cm/s  Systemic VTI:  0.27 m MV E/A ratio:  0.65         Systemic Diam: 2.00 cm Rudean Haskell MD Electronically signed by Rudean Haskell MD Signature Date/Time: 08/28/2021/3:11:46 PM    Final    VAS [place] LOWER EXTREMITY VENOUS (DVT)  Result Date: 08/28/2021  Lower Venous DVT Study Patient Name:  MITZIE MARLAR  Date of Exam:   08/28/2021 Medical Rec #: [id number]      Accession #:    [id number] Date of Birth: 1936-01-20      Patient Gender: F Patient Age:   86 years Exam Location:  [facility] Procedure:      VAS [place] LOWER EXTREMITY VENOUS (DVT) Referring Phys: Eugenie Norrie --------------------------------------------------------------------------------  Indications: Pulmonary embolism.  Comparison Study: No prior studies. Performing Technologist: Darlin Coco RDMS, RVT  Examination Guidelines: A complete evaluation includes B-mode imaging, spectral Doppler, color Doppler, and power Doppler as needed of all accessible portions of each vessel. Bilateral testing is considered an integral part of a complete examination. Limited examinations for reoccurring indications may be performed as noted. The reflux portion of the exam is performed with the patient in reverse Trendelenburg.  +---------+---------------+---------+-----------+----------+--------------+  RIGHT     Compressibility Phasicity Spontaneity Properties Thrombus Aging  +---------+---------------+---------+-----------+----------+--------------+  CFV       Full            Yes       Yes                                    +---------+---------------+---------+-----------+----------+--------------+  SFJ       Full                                                             +---------+---------------+---------+-----------+----------+--------------+  FV Prox   Full                                                             +---------+---------------+---------+-----------+----------+--------------+  FV Mid     Full                                                             +---------+---------------+---------+-----------+----------+--------------+  FV Distal Full                                                             +---------+---------------+---------+-----------+----------+--------------+  PFV       Full                                                             +---------+---------------+---------+-----------+----------+--------------+  POP       Full                                                             +---------+---------------+---------+-----------+----------+--------------+  PTV       Full                                                             +---------+---------------+---------+-----------+----------+--------------+  PERO      None            No        No                     Acute           +---------+---------------+---------+-----------+----------+--------------+  Gastroc   Full                                                             +---------+---------------+---------+-----------+----------+--------------+   +---------+---------------+---------+-----------+----------+--------------+  LEFT      Compressibility Phasicity Spontaneity Properties Thrombus Aging  +---------+---------------+---------+-----------+----------+--------------+  CFV       Full            Yes       Yes                                    +---------+---------------+---------+-----------+----------+--------------+  SFJ       Full                                                             +---------+---------------+---------+-----------+----------+--------------+  FV Prox   Full                                                             +---------+---------------+---------+-----------+----------+--------------+  FV Mid    Full                                                             +---------+---------------+---------+-----------+----------+--------------+  FV Distal Full                                                              +---------+---------------+---------+-----------+----------+--------------+  PFV       Full                                                             +---------+---------------+---------+-----------+----------+--------------+  POP       Full            Yes       Yes                                    +---------+---------------+---------+-----------+----------+--------------+  PTV       Full                                                             +---------+---------------+---------+-----------+----------+--------------+  PERO      Full                                                             +---------+---------------+---------+-----------+----------+--------------+  Gastroc   Full                                                             +---------+---------------+---------+-----------+----------+--------------+     Summary: RIGHT: - Findings consistent with acute deep vein thrombosis involving the right peroneal veins.  - No cystic structure found in the popliteal fossa.  LEFT: - There is no evidence of deep vein thrombosis in the lower extremity.  - No cystic structure found in the popliteal fossa.  *See table(s) above for measurements and  observations. Electronically signed by Monica Martinez MD on 08/28/2021 at 5:10:54 PM.    Final         Scheduled Meds:  aspirin  81 mg Oral Daily   [START ON 08/30/2021] azithromycin  500 mg Oral Daily   cholecalciferol  1,000 Units Oral Daily   enoxaparin (LOVENOX) injection  75 mg Subcutaneous BID   guaiFENesin  10 mL Oral Q4H   ipratropium-albuterol  3 mL Nebulization BID   levothyroxine  75 mcg Oral Q0600   lisinopril  5 mg Oral Daily   memantine  5 mg Oral Daily   metoprolol succinate  25 mg Oral Daily   OLANZapine  10 mg Oral QHS   OLANZapine  5 mg Oral Daily   sertraline  50 mg Oral Daily   simvastatin  20 mg Oral QHS   vitamin B-12  1,000 mcg Oral Daily   Continuous Infusions:  cefTRIAXone (ROCEPHIN)  IV 2 g  (08/29/21 0825)     LOS: 1 day       Phillips Climes, MD [facility]   To contact the attending provider between 7A-7P or the covering provider during after hours 7P-7A, please log into the web site [email or web address] and access using universal [place] password for that web site. If you do not have the password, please call the hospital operator.  08/29/2021, 1:51 PM

## 2021-08-30 LAB — CBC
HCT: 30.6 % — ABNORMAL LOW (ref 36.0–46.0)
Hemoglobin: 9.5 g/dL — ABNORMAL LOW (ref 12.0–15.0)
MCH: 27.1 pg (ref 26.0–34.0)
MCHC: 31 g/dL (ref 30.0–36.0)
MCV: 87.2 fL (ref 80.0–100.0)
Platelets: 170 10*3/uL (ref 150–400)
RBC: 3.51 MIL/uL — ABNORMAL LOW (ref 3.87–5.11)
RDW: 15.2 % (ref 11.5–15.5)
WBC: 5.9 10*3/uL (ref 4.0–10.5)
nRBC: 0 % (ref 0.0–0.2)

## 2021-08-30 LAB — BASIC METABOLIC PANEL
Anion gap: 7 (ref 5–15)
BUN: 15 mg/dL (ref 8–23)
CO2: 25 mmol/L (ref 22–32)
Calcium: 7.9 mg/dL — ABNORMAL LOW (ref 8.9–10.3)
Chloride: 108 mmol/L (ref 98–111)
Creatinine, Ser: 0.87 mg/dL (ref 0.44–1.00)
GFR, Estimated: >60 mL/min (ref >60)
Glucose, Bld: 126 mg/dL — ABNORMAL HIGH (ref 70–99)
Potassium: 3.8 mmol/L (ref 3.5–5.1)
Sodium: 140 mmol/L (ref 135–145)

## 2021-08-30 MED ORDER — APIXABAN 5 MG PO TABS
10.0000 mg | ORAL_TABLET | Freq: Two times a day (BID) | ORAL | Status: DC
Start: 2021-08-30 — End: 2021-08-31
  Administered 2021-08-30 – 2021-08-31 (×2): 10 mg via ORAL
  Filled 2021-08-30 (×2): qty 2

## 2021-08-30 MED ORDER — APIXABAN 5 MG PO TABS: 5.0000 mg | ORAL_TABLET | Freq: Two times a day (BID) | ORAL | Status: DC

## 2021-08-30 NOTE — Progress Notes (Signed)
PROGRESS NOTE    [name]  IRJ:[id number] DOB: 1935-12-20 DOA: 08/27/2021 PCP: Maurice Small, MD   Chief Complaint  Patient presents with   Altered Mental Status    Brief Narrative:   [name] is a 86 y.o. Caucasian female with medical history significant for anxiety, depression, hypertension, CVA and thyroid disease, who presented to the ER with acute onset of altered mental status as well as low-grade fever at home with associated congested cough with inability to expectorate as well as dyspnea.  Patient is not on home O2.  She denies any chest pain or palpitations.  Her work-up is significant for bilateral PE and pneumonia.    Assessment & Plan:   Principal Problem:   Bilateral pulmonary embolism (HCC) Active Problems:   Sepsis ([place])   Dementia with behavioral disturbance   Depression   Essential hypertension   Mixed dyslipidemia   Hypothyroidism, unspecified  Bilateral pulmonary embolism (HCC)/ acute DVT RLL -CTA significant for extensive bilateral PE, and Doppler significant for acute right lower extremity DVT -2D echo with a preserved EF, no evidence of right heart strain, but significant for increased pulmonary artery pressure -Initially on heparin drip, will transition to Eliquis today.   Sepsis ([place]) present on admission -Patient met sepsis criteria given hypotension, tachypnea, tachycardia and fever -Related to pneumonia -Continue with IV Rocephin and azithromycin.  Altered mental status -Patient is lethargic this morning. -ABG with no evidence of CO2 retention. -CT head with no acute findings. -Initially held her sedative medications due to lethargy, but later in the day she was confused, she was resumed back on home dose gabapentin and Zyprexa, mentation back to baseline.   Hypothyroidism, unspecified - We will continue Synthroid.   Mixed dyslipidemia - We will continue statin therapy.   Essential hypertension -Lisinopril has been discontinued  given soft blood pressure, blood pressure is acceptable today, continue with Toprol-XL.     Depression - We will continue Zoloft.   Dementia with behavioral disturbance - We will continue Namenda  -Continue with Zyprexa and home medications    DVT prophylaxis: Heparin GTT>> lovenox Code Status: [place] Family Communication: none at bedside, D/W granddaughter by phone 3/11 Disposition: PT/OT recommends subacute rehab, but as discussed with the granddaughter she wishes for patient to go home with home health with is very appropriate given patient age and risk of dementia and unfamiliar places  Status is: Inpatient Remains inpatient appropriate because: iv  ABX   Consultants:  None   Subjective:  No significant events overnight as discussed with staff, patient is demented and unable to provide any reliable complaints.   Objective: Vitals:   08/30/21 0400 08/30/21 0812 08/30/21 0815 08/30/21 0915  BP: (!) 130/59 (!) 147/75 133/66   Pulse: 67 78 73   Resp: 18  16   Temp: 98.5 F (36.9 C)  (!) 97.4 F (36.3 C)   TempSrc: Oral  Oral   SpO2: 100%  95% 96%  Weight:      Height:        Intake/Output Summary (Last 24 hours) at 08/30/2021 1132 Last data filed at 08/30/2021 0815 Gross per 24 hour  Intake 20 ml  Output 900 ml  Net -880 ml   Filed Weights   08/28/21 2020  Weight: 74.4 kg    Examination:  Awake, frail, demented Symmetrical Chest wall movement, Good air movement bilaterally, CTAB RRR,No Gallops,Rubs or new Murmurs, No Parasternal Heave +ve B.Sounds, Abd Soft, No tenderness, No rebound - guarding  or rigidity. No Cyanosis, Clubbing or edema, No new Rash or bruise       Data Reviewed: I have personally reviewed following labs and imaging studies  CBC: Recent Labs  Lab 08/27/21 1655 08/27/21 1708 08/28/21 0426 08/28/21 0839 08/29/21 0102 08/29/21 2354  WBC 6.2  --  6.4  --  7.7 5.9  NEUTROABS 5.3  --   --   --   --   --   HGB 11.3* 11.9* 9.5*  9.5* 9.4* 9.5*  HCT 37.3 35.0* 30.9* 28.0* 30.4* 30.6*  MCV 89.2  --  89.0  --  87.1 87.2  PLT 163  --  142*  --  160 160    Basic Metabolic Panel: Recent Labs  Lab 08/27/21 1655 08/27/21 1708 08/28/21 0426 08/28/21 0839 08/29/21 0102 08/29/21 2354  NA 135 135 136 139 137 140  K 4.3 4.3 3.7 3.8 3.8 3.8  CL 102  --  107  --  107 108  CO2 23  --  22  --  22 25  GLUCOSE 112*  --  104*  --  111* 126*  BUN 28*  --  24*  --  17 15  CREATININE 1.09*  --  0.94  --  0.97 0.87  CALCIUM 8.5*  --  7.2*  --  7.8* 7.9*    GFR: Estimated Creatinine Clearance: 49.6 mL/min (by C-G formula based on SCr of 0.87 mg/dL).  Liver Function Tests: Recent Labs  Lab 08/27/21 1655  AST 23  ALT 16  ALKPHOS 46  BILITOT 0.6  PROT 6.4*  ALBUMIN 3.5    CBG: Recent Labs  Lab 08/27/21 1656 08/28/21 1633  GLUCAP 105* 111*     Recent Results (from the past 240 hour(s))  Resp Panel by RT-PCR (Flu A&B, Covid) Nasopharyngeal Swab     Status: None   Collection Time: 08/27/21  4:56 PM   Specimen: Nasopharyngeal Swab; Nasopharyngeal(NP) swabs in vial transport medium  Result Value Ref Range Status   SARS Coronavirus 2 by RT PCR NEGATIVE NEGATIVE Final    Comment: (NOTE) SARS-CoV-2 target nucleic acids are NOT DETECTED.  The SARS-CoV-2 RNA is generally detectable in upper respiratory specimens during the acute phase of infection. The lowest concentration of SARS-CoV-2 viral copies this assay can detect is 138 copies/mL. A negative result does not preclude SARS-Cov-2 infection and should not be used as the sole basis for treatment or other patient management decisions. A negative result may occur with  improper specimen collection/handling, submission of specimen other than nasopharyngeal swab, presence of viral mutation(s) within the areas targeted by this assay, and inadequate number of viral copies(<138 copies/mL). A negative result must be combined with clinical observations, patient  history, and epidemiological information. The expected result is Negative.  Fact Sheet for Patients:  EntrepreneurPulse.com.au  Fact Sheet for Healthcare Providers:  IncredibleEmployment.be  This test is no t yet approved or cleared by the [place] FDA and  has been authorized for detection and/or diagnosis of SARS-CoV-2 by FDA under an Emergency Use Authorization (EUA). This EUA will remain  in effect (meaning this test can be used) for the duration of the COVID-19 declaration under Section 564(b)(1) of the Act, 21 U.S.C.section 360bbb-3(b)(1), unless the authorization is terminated  or revoked sooner.       Influenza A by PCR NEGATIVE NEGATIVE Final   Influenza B by PCR NEGATIVE NEGATIVE Final    Comment: (NOTE) The Xpert Xpress SARS-CoV-2/FLU/RSV plus assay is intended as an aid  in the diagnosis of influenza from Nasopharyngeal swab specimens and should not be used as a sole basis for treatment. Nasal washings and aspirates are unacceptable for Xpert Xpress SARS-CoV-2/FLU/RSV testing.  Fact Sheet for Patients: EntrepreneurPulse.com.au  Fact Sheet for Healthcare Providers: IncredibleEmployment.be  This test is not yet approved or cleared by the [place] FDA and has been authorized for detection and/or diagnosis of SARS-CoV-2 by FDA under an Emergency Use Authorization (EUA). This EUA will remain in effect (meaning this test can be used) for the duration of the COVID-19 declaration under Section 564(b)(1) of the Act, 21 U.S.C. section 360bbb-3(b)(1), unless the authorization is terminated or revoked.  Performed at [facility], [place] [address], [place], [place] [address]   Culture, blood (routine x 2)     Status: None (Preliminary result)   Collection Time: 08/27/21  5:03 PM   Specimen: BLOOD  Result Value Ref Range Status   Specimen Description BLOOD BLOOD RIGHT FOREARM  Final    Special Requests   Final    BOTTLES DRAWN AEROBIC AND ANAEROBIC Blood Culture adequate volume   Culture   Final    NO GROWTH 3 DAYS Performed at [facility], [place] [address] [place], [place] [address]    Report Status PENDING  Incomplete  Culture, blood (routine x 2)     Status: None (Preliminary result)   Collection Time: 08/27/21  5:20 PM   Specimen: BLOOD LEFT ARM  Result Value Ref Range Status   Specimen Description BLOOD LEFT ARM  Final   Special Requests   Final    BOTTLES DRAWN AEROBIC AND ANAEROBIC Blood Culture results may not be optimal due to an inadequate volume of blood received in culture bottles   Culture   Final    NO GROWTH 3 DAYS Performed at [facility], [place] [address], [place],  [address]    Report Status PENDING  Incomplete         Radiology Studies: No results found.      Scheduled Meds:  apixaban  10 mg Oral BID   Followed by   Derrill Memo ON 09/06/2021] apixaban  5 mg Oral BID   aspirin  81 mg Oral Daily   azithromycin  500 mg Oral Daily   cholecalciferol  1,000 Units Oral Daily   guaiFENesin  10 mL Oral Q4H   ipratropium-albuterol  3 mL Nebulization BID   levothyroxine  50 mcg Oral Q0600   memantine  5 mg Oral Daily   metoprolol succinate  25 mg Oral Daily   OLANZapine  10 mg Oral QHS   OLANZapine  5 mg Oral Daily   sertraline  50 mg Oral Daily   simvastatin  20 mg Oral QHS   vitamin B-12  1,000 mcg Oral Daily   Continuous Infusions:  cefTRIAXone (ROCEPHIN)  IV 2 g (08/30/21 0815)     LOS: 2 days       Phillips Climes, MD [facility]   To contact the attending provider between 7A-7P or the covering provider during after hours 7P-7A, please log into the web site [email or web address] and access using universal [place] password for that web site. If you do not have the password, please call the hospital operator.  08/30/2021, 11:32 AM

## 2021-08-30 NOTE — TOC Initial Note (Signed)
Transition of Care (TOC) - Initial/Assessment Note  ? ? ?Patient Details  ?Name: NAIYA CORRAL ?MRN: [id number] ?Date of Birth: [birth year] ? ?Transition of Care (TOC) CM/SW Contact:    ?Ivette Loyal, LCSWA ?Phone Number: ?08/30/2021, 12:57 PM ? ?Clinical Narrative:                 ?CSW received SNF consult, CSW spoke with pt granddaughter via phone bc pt is not fully oriented. CSW introduced self and explained role at the hospital. Pt reports that PTA the at home with granddaughter who is her primary caretaker. PT has recommended SNF but pt granddaughter informed CSW that she would prefer her grandmother to return home with [facility] bc their home is fully equipped with walk in showers, a lift for the stairs and all other basic care equipment needs. PT granddaughter states that if [facility] is recommended by PT/OT then they would prefer [facility]. ? ?CSW will continue to follow. ?  ? ?  ?  ? ? ?Patient Goals and CMS Choice ?  ?  ?  ? ?Expected Discharge Plan and Services ?  ?  ?  ?  ?  ?                ?  ?  ?  ?  ?  ?  ?  ?  ?  ?  ? ?Prior Living Arrangements/Services ?  ?  ?  ?       ?  ?  ?  ?  ? ?Activities of Daily Living ?Home Assistive Devices/Equipment: Dan Humphreys (specify type), Shower chair with back, Dentures (specify type) ?ADL Screening (condition at time of admission) ?Patient's cognitive ability adequate to safely complete daily activities?: Yes ?Is the patient deaf or have difficulty hearing?: Yes ?Does the patient have difficulty seeing, even when wearing glasses/contacts?: No ?Does the patient have difficulty concentrating, remembering, or making decisions?: Yes ?Patient able to express need for assistance with ADLs?: Yes ?Does the patient have difficulty dressing or bathing?: Yes ?Independently performs ADLs?: No ?Communication: Independent ?Dressing (OT): Independent ?Grooming: Needs assistance ?Is this a change from baseline?: Pre-admission baseline ?Feeding: Needs assistance ?Is this a change from baseline?:  Pre-admission baseline ?Bathing: Needs assistance ?Is this a change from baseline?: Pre-admission baseline ?Toileting: Independent ?In/Out Bed: Independent ?Walks in Home: Independent ?Does the patient have difficulty walking or climbing stairs?: Yes ?Weakness of Legs: Both ?Weakness of Arms/Hands: None ? ?Permission Sought/Granted ?  ?  ?   ?   ?   ?   ? ?Emotional Assessment ?  ?  ?  ?  ?  ?  ? ?Admission diagnosis:  Tachycardia [R00.0] ?Sepsis (HCC) [A41.9] ?Hypotension, unspecified hypotension type [I95.9] ?Patient Active Problem List  ? Diagnosis Date Noted  ? Sepsis (HCC) 08/28/2021  ? Bilateral pulmonary embolism (HCC) 08/28/2021  ? Hypothyroidism, unspecified 08/28/2021  ? H/O total hip arthroplasty, left 10/22/2020  ? Osteoarthritis of left hip 02/06/2020  ? Anxiety 02/04/2020  ? CVA (cerebral vascular accident) (HCC) 02/04/2020  ? Depression 02/04/2020  ? Essential hypertension 02/04/2020  ? HSV-2 (herpes simplex virus 2) infection 02/04/2020  ? Insomnia 02/04/2020  ? Vitamin D deficiency 02/04/2020  ? Constipation 01/09/2020  ? Hearing loss 01/09/2020  ? Hx of completed stroke 01/09/2020  ? Hx of herpes simplex infection 01/09/2020  ? Mixed dyslipidemia 01/09/2020  ? Normocytic anemia 01/09/2020  ? Prediabetes 01/09/2020  ? Primary osteoarthritis involving multiple joints 01/09/2020  ? Venous insufficiency 01/09/2020  ? Vitamin B12  deficiency 01/09/2020  ? Dementia with behavioral disturbance 01/08/2020  ? Delusional thoughts (HCC) 01/06/2020  ? Aggressive behavior   ? Hallucinations   ? Acquired hypothyroidism 03/18/2014  ? Mixed hyperlipidemia 03/18/2014  ? ?PCP:  [name], MD ?Pharmacy:   ?[id number] [place], [place] - [address] AT [facility] [address] ?[address] ?[place] [address] ?Phone: [phone] Fax: [phone] ? ? ? ? ?Social Determinants of Health (SDOH) Interventions ?  ? ?Readmission Risk Interventions ?No flowsheet data found. ? ? ?

## 2021-08-30 NOTE — Discharge Instructions (Signed)
Information on my medicine - ELIQUIS? (apixaban) ? ?Why was Eliquis? prescribed for you? ?Eliquis? was prescribed to treat blood clots that may have been found in the veins of your legs (deep vein thrombosis) or in your lungs (pulmonary embolism) and to reduce the risk of them occurring again. ? ?What do You need to know about Eliquis? ? ?The starting dose is 10 mg (two 5 mg tablets) taken TWICE daily for the FIRST SEVEN (7) DAYS, then on 09/06/21  the dose is reduced to ONE 5 mg tablet taken TWICE daily.  Eliquis? may be taken with or without food.  ? ?Try to take the dose about the same time in the morning and in the evening. If you have difficulty swallowing the tablet whole please discuss with your pharmacist how to take the medication safely. ? ?Take Eliquis? exactly as prescribed and DO NOT stop taking Eliquis? without talking to the doctor who prescribed the medication.  Stopping may increase your risk of developing a new blood clot.  Refill your prescription before you run out. ? ?After discharge, you should have regular check-up appointments with your healthcare provider that is prescribing your Eliquis?. ?   ?What do you do if you miss a dose? ?If a dose of ELIQUIS? is not taken at the scheduled time, take it as soon as possible on the same day and twice-daily administration should be resumed. The dose should not be doubled to make up for a missed dose. ? ?Important Safety Information ?A possible side effect of Eliquis? is bleeding. You should call your healthcare provider right away if you experience any of the following: ?Bleeding from an injury or your nose that does not stop. ?Unusual colored urine (red or dark brown) or unusual colored stools (red or black). ?Unusual bruising for unknown reasons. ?A serious fall or if you hit your head (even if there is no bleeding). ? ?Some medicines may interact with Eliquis? and might increase your risk of bleeding or clotting while on Eliquis?Marland Kitchen To help avoid this,  consult your healthcare provider or pharmacist prior to using any new prescription or non-prescription medications, including herbals, vitamins, non-steroidal anti-inflammatory drugs (NSAIDs) and supplements. ? ?This website has more information on Eliquis? (apixaban): http://www.eliquis.com/eliquis/home  ?

## 2021-08-31 ENCOUNTER — Other Ambulatory Visit (HOSPITAL_COMMUNITY): Payer: Self-pay

## 2021-08-31 DIAGNOSIS — J189 Pneumonia, unspecified organism: Secondary | ICD-10-CM

## 2021-08-31 DIAGNOSIS — I82401 Acute embolism and thrombosis of unspecified deep veins of right lower extremity: Secondary | ICD-10-CM

## 2021-08-31 LAB — CBC
HCT: 31.8 % — ABNORMAL LOW (ref 36.0–46.0)
Hemoglobin: 10 g/dL — ABNORMAL LOW (ref 12.0–15.0)
MCH: 27.6 pg (ref 26.0–34.0)
MCHC: 31.4 g/dL (ref 30.0–36.0)
MCV: 87.8 fL (ref 80.0–100.0)
Platelets: 201 10*3/uL (ref 150–400)
RBC: 3.62 MIL/uL — ABNORMAL LOW (ref 3.87–5.11)
RDW: 15.1 % (ref 11.5–15.5)
WBC: 6 10*3/uL (ref 4.0–10.5)
nRBC: 0 % (ref 0.0–0.2)

## 2021-08-31 MED ORDER — AZITHROMYCIN 500 MG PO TABS
500.0000 mg | ORAL_TABLET | Freq: Every day | ORAL | 0 refills | Status: DC
  Filled 2021-08-31: qty 1, 1d supply, fill #0

## 2021-08-31 MED ORDER — APIXABAN 5 MG PO TABS
ORAL_TABLET | ORAL | 0 refills | Status: DC
  Filled 2021-08-31: qty 70, 30d supply, fill #0

## 2021-08-31 MED ORDER — CEFDINIR 300 MG PO CAPS
300.0000 mg | ORAL_CAPSULE | Freq: Two times a day (BID) | ORAL | 0 refills | Status: DC
  Filled 2021-08-31: qty 2, 1d supply, fill #0

## 2021-08-31 MED ORDER — ACETAMINOPHEN 500 MG PO TABS: 500.0000 mg | ORAL_TABLET | ORAL | 0 refills | Status: DC | PRN

## 2021-08-31 NOTE — Progress Notes (Signed)
Occupational Therapy Treatment ?Patient Details ?Name: [name] ?MRN: [id number] ?DOB: Nov 21, 1935 ?Today's Date: 08/31/2021 ? ? ?History of present illness 86 y.o. female presents to [facility] hospital on 08/27/2021 with AMS, fever, cough, and dyspnea. Pt found to have bilateral PE on chest CTA, as well as PNA. PMH includes anxiety, depression, hypertension, CVA and thyroid disease. ?  ?OT comments ? Patient making good progress with OT treatment. Patient performed grooming seated on EOB with setup and supervision for safety.  Toilet transfer training performed from EOB to chair with RW and min assist. Patient requires cues for hand placement and safety with walker use. Acute OT to continue to follow.   ? ?Recommendations for follow up therapy are one component of a multi-disciplinary discharge planning process, led by the attending physician.  Recommendations may be updated based on patient status, additional functional criteria and insurance authorization. ?   ?Follow Up Recommendations ? Skilled nursing-short term rehab (<3 hours/day)  ?  ?Assistance Recommended at Discharge Frequent or constant Supervision/Assistance  ?Patient can return home with the following ? A lot of help with bathing/dressing/bathroom ?  ?Equipment Recommendations ?    ?  ?Recommendations for Other Services   ? ?  ?Precautions / Restrictions Precautions ?Precautions: Fall ?Precaution Comments: monitor HR and SpO2 ?Restrictions ?Weight Bearing Restrictions: No  ? ? ?  ? ?Mobility Bed Mobility ?Overal bed mobility: Needs Assistance ?Bed Mobility: Supine to Sit, Sit to Supine ?  ?  ?Supine to sit: Min assist, HOB elevated ?Sit to supine: Min assist ?  ?General bed mobility comments: required assistance with LLE to get back to supine ?  ? ?Transfers ?Overall transfer level: Needs assistance ?Equipment used: Rolling walker (2 wheels) ?Transfers: Sit to/from Stand, Bed to chair/wheelchair/BSC ?Sit to Stand: Min assist ?  ?  ?Step pivot transfers: Min  assist ?  ?  ?General transfer comment: simulated toilet transfer from EOB with RW ?  ?  ?Balance Overall balance assessment: Needs assistance ?Sitting-balance support: No upper extremity supported, Feet supported ?Sitting balance-Leahy Scale: Fair ?Sitting balance - Comments: able to perform grooming tasks seated on EOB ?  ?Standing balance support: Bilateral upper extremity supported ?Standing balance-Leahy Scale: Poor ?Standing balance comment: stood from EOB with RW ?  ?  ?  ?  ?  ?  ?  ?  ?  ?  ?  ?   ? ?ADL either performed or assessed with clinical judgement  ? ?ADL Overall ADL's : Needs assistance/impaired ?  ?  ?Grooming: Wash/dry hands;Wash/dry face;Oral care;Supervision/safety;Set up;Sitting ?Grooming Details (indicate cue type and reason): performed seated on EOB ?  ?  ?  ?  ?  ?  ?  ?  ?Toilet Transfer: Minimal assistance;Rolling walker (2 wheels) ?Toilet Transfer Details (indicate cue type and reason): simulated toilet transfer from EOB to armed chair ?  ?  ?  ?  ?  ?General ADL Comments: cues for safety and hand placement for transfers ?  ? ?Extremity/Trunk Assessment Upper Extremity Assessment ?LUE Deficits / Details: PROM ~0-90 deg shd flexion with an end feel ?  ?  ?  ?  ?  ? ?Vision   ?  ?  ?Perception   ?  ?Praxis   ?  ? ?Cognition Arousal/Alertness: Awake/alert ?Behavior During Therapy: [facility] for tasks assessed/performed ?Overall Cognitive Status: Impaired/Different from baseline ?Area of Impairment: Orientation, Attention, Following commands, Memory, Safety/judgement, Awareness, Problem solving ?  ?  ?  ?  ?  ?  ?  ?  ?  Orientation Level: Disoriented to, Place, Time, Situation ?Current Attention Level: Focused ?Memory: Decreased short-term memory ?Following Commands: Follows one step commands consistently ?Safety/Judgement: Decreased awareness of safety, Decreased awareness of deficits ?Awareness: Intellectual ?Problem Solving: Requires verbal cues ?General Comments: required cues for safety with  transfers and for hand placement ?  ?  ?   ?Exercises   ? ?  ?Shoulder Instructions   ? ? ?  ?General Comments    ? ? ?Pertinent Vitals/ Pain       Pain Assessment ?Pain Assessment: No/denies pain ? ?Home Living   ?  ?  ?  ?  ?  ?  ?  ?  ?  ?  ?  ?  ?  ?  ?  ?  ?  ?  ? ?  ?Prior Functioning/Environment    ?  ?  ?  ?   ? ?Frequency ? Min 2X/week  ? ? ? ? ?  ?Progress Toward Goals ? ?OT Goals(current goals can now be found in the care plan section) ? Progress towards OT goals: Progressing toward goals ? ?Acute Rehab OT Goals ?Patient Stated Goal: get out of bed more ?OT Goal Formulation: With patient ?Time For Goal Achievement: 09/12/21 ?Potential to Achieve Goals: Fair ?ADL Goals ?Pt Will Perform Grooming: with set-up;sitting ?Pt Will Transfer to Toilet: with modified independence;ambulating;bedside commode  ?Plan Discharge plan remains appropriate   ? ?Co-evaluation ? ? ?   ?  ?  ?  ?  ? ?  ?AM-PAC OT "6 Clicks" Daily Activity     ?Outcome Measure ? ? Help from another person eating meals?: A Little ?Help from another person taking care of personal grooming?: A Little ?Help from another person toileting, which includes using toliet, bedpan, or urinal?: A Lot ?Help from another person bathing (including washing, rinsing, drying)?: A Lot ?Help from another person to put on and taking off regular upper body clothing?: A Lot ?Help from another person to put on and taking off regular lower body clothing?: A Lot ?6 Click Score: 14 ? ?  ?End of Session Equipment Utilized During Treatment: Rolling walker (2 wheels);Oxygen ? ?OT Visit Diagnosis: Muscle weakness (generalized) (M62.81);Cognitive communication deficit (R41.841) ?  ?Activity Tolerance Patient tolerated treatment well ?  ?Patient Left in bed;with call bell/phone within reach;with bed alarm set ?  ?Nurse Communication Mobility status ?  ? ?   ? ?Time: 4193-7902 ?OT Time Calculation (min): 27 min ? ?Charges: OT General Charges ?$OT Visit: 1 Visit ?OT  Treatments ?$Self Care/Home Management : 23-37 mins ? ?Alfonse Flavors, OTA ?Acute Rehabilitation Services  ?Pager [phone] ?Office [phone] ? ? ?Khristine Verno Jeannett Senior ?08/31/2021, 12:40 PM ?

## 2021-08-31 NOTE — Progress Notes (Signed)
Pt discharged from unit. Medication/discharge instruction given  Curtistine Pettitt K Reshaun Briseno, RN  

## 2021-08-31 NOTE — Discharge Summary (Signed)
Physician Discharge Summary  [name] WNU:[id number] DOB: 02/15/1936 DOA: 08/27/2021  PCP: Maurice Small, MD  Admit date: 08/27/2021 Discharge date: 08/31/2021  Admitted From: Home Disposition:  Home   Recommendations for Outpatient Follow-up:  Follow up with PCP in 1-2 weeks, please repeat chest x-ray, CBC and BMP during next visit Continue with Eliquis.  Home HealthYES  Discharge Condition:Stable CODE STATUS:FULL Diet recommendation: Heart Healthy   Brief/Interim Summary:  [name] is a 86 y.o. Caucasian female with medical history significant for anxiety, depression, hypertension, CVA and thyroid disease, who presented to the ER with acute onset of altered mental status as well as low-grade fever at home with associated congested cough with inability to expectorate as well as dyspnea.  Patient is not on home O2.  She denies any chest pain or palpitations.  Her work-up is significant for bilateral PE and pneumonia.     Bilateral pulmonary embolism (HCC)/ acute DVT RLL -CTA significant for extensive bilateral PE, and Doppler significant for acute right lower extremity DVT -2D echo with a preserved EF, no evidence of right heart strain, but significant for increased pulmonary artery pressure -Initially on heparin drip, she is transition to Eliquis   Sepsis [facility] [place]) present on admission -Patient met sepsis criteria given hypotension, tachypnea, tachycardia and fever -Related to pneumonia -Treated with IV Rocephin and azithromycin, will discharge on azithromycin and cefdinir to finish total 5 days of treatment -repeat chest x-ray as an outpatient to ensure resolution of opacity    Altered mental status/acute metabolic encephalopathy -Patient is lethargic on admission, ABG with no evidence of retention, CT head with no acute findings, her home medications including gabapentin and Zyprexa has been held, mentation back to baseline, she is back on her home medications for last few days  . with no encephalopathy.     Hypothyroidism, unspecified - continue Synthroid.   Mixed dyslipidemia - continue statin therapy.   Essential hypertension -Continue with home medications  Depression - We will continue Zoloft.   Dementia with behavioral disturbance - We will continue Namenda  -Continue with Zyprexa and home medications     Discharge Diagnoses:  Principal Problem:   Bilateral pulmonary embolism (HCC) Active Problems:   Sepsis ([place])   Dementia with behavioral disturbance   Depression   Essential hypertension   Mixed dyslipidemia   Hypothyroidism, unspecified   CAP (community acquired pneumonia)   Acute deep vein thrombosis (DVT) of right lower extremity [facility])    Discharge Instructions  Discharge Instructions     Diet - low sodium heart healthy   Complete by: As directed    Discharge instructions   Complete by: As directed    Follow with Primary MD Maurice Small, MD in 7 days   Get CBC, CMP, 2 view Chest X ray checked  by Primary MD next visit.    Activity: As tolerated with Full fall precautions use walker/cane & assistance as needed   Disposition Home    Diet: Heart Healthy.   On your next visit with your primary care physician please Get Medicines reviewed and adjusted.   Please request your Prim.MD to go over all Hospital Tests and Procedure/Radiological results at the follow up, please get all Hospital records sent to your Prim MD by signing hospital release before you go home.   If you experience worsening of your admission symptoms, develop shortness of breath, life threatening emergency, suicidal or homicidal thoughts you must seek medical attention immediately by calling 911 or calling your MD  immediately  if symptoms less severe.  You Must read complete instructions/literature along with all the possible adverse reactions/side effects for all the Medicines you take and that have been prescribed to you. Take any new Medicines after  you have completely understood and accpet all the possible adverse reactions/side effects.   Do not drive, operating heavy machinery, perform activities at heights, swimming or participation in water activities or provide baby sitting services if your were admitted for syncope or siezures until you have seen by Primary MD or a Neurologist and advised to do so again.  Do not drive when taking Pain medications.    Do not take more than prescribed Pain, Sleep and Anxiety Medications  Special Instructions: If you have smoked or chewed Tobacco  in the last 2 yrs please stop smoking, stop any regular Alcohol  and or any Recreational drug use.  Wear Seat belts while driving.   Please note  You were cared for by a hospitalist during your hospital stay. If you have any questions about your discharge medications or the care you received while you were in the hospital after you are discharged, you can call the unit and asked to speak with the hospitalist on call if the hospitalist that took care of you is not available. Once you are discharged, your primary care physician will handle any further medical issues. Please note that NO REFILLS for any discharge medications will be authorized once you are discharged, as it is imperative that you return to your primary care physician (or establish a relationship with a primary care physician if you do not have one) for your aftercare needs so that they can reassess your need for medications and monitor your lab values.   Increase activity slowly   Complete by: As directed       Allergies as of 08/31/2021       Reactions   Donepezil Other (See Comments)   POSSIBLE (??) Hallucinations and sleep disturbances   Escitalopram Oxalate Other (See Comments)   UNK reaction   Hydrochlorothiazide Other (See Comments)   UNK reaction        Medication List     STOP taking these medications    aspirin EC 81 MG tablet       TAKE these medications     acetaminophen 500 MG tablet Commonly known as: TYLENOL Take 1 tablet (500 mg total) by mouth every 4 (four) hours as needed for mild pain or fever. What changed:  how much to take when to take this   apixaban 5 MG Tabs tablet Commonly known as: ELIQUIS Please take 10 mg oral p.o. twice daily, until 3/18, and start on 5 mg oral twice daily on 3/19.   azithromycin 500 MG tablet Commonly known as: ZITHROMAX Take 1 tablet (500 mg total) by mouth daily. Start taking on: September 01, 2021   cefdinir 300 MG capsule Commonly known as: OMNICEF Take 1 capsule (300 mg total) by mouth 2 (two) times daily. Start taking on: September 01, 2021   gabapentin 100 MG capsule Commonly known as: Neurontin Take 1 cap in AM, 2 caps in PM What changed:  how much to take when to take this   levothyroxine 50 MCG tablet Commonly known as: SYNTHROID Take 50 mcg by mouth daily before breakfast. What changed: Another medication with the same name was removed. Continue taking this medication, and follow the directions you see here.   lisinopril 5 MG tablet Commonly known as: ZESTRIL Take 5  mg by mouth in the morning.   memantine 5 MG tablet Commonly known as: NAMENDA Take 1 tablet (5 mg total) by mouth in the morning.   metoprolol succinate 50 MG 24 hr tablet Commonly known as: TOPROL-XL Take 25 mg by mouth in the morning.   [place]spargo Sprinkle 25 MG Cs24 Generic drug: Metoprolol Succinate Take 25 mg by mouth daily.   OLANZapine 10 MG tablet Commonly known as: ZYPREXA TAKE 1/2 TABLET IN THE MORNING AND TAKE 1 TABLET AT BEDTIME What changed:  how much to take how to take this when to take this additional instructions   sertraline 50 MG tablet Commonly known as: ZOLOFT Take 1 tablet (50 mg total) by mouth daily.   simvastatin 20 MG tablet Commonly known as: ZOCOR Take 20 mg by mouth at bedtime.   tiZANidine 2 MG tablet Commonly known as: ZANAFLEX Take 1 tablet (2 mg total) by mouth  every 6 (six) hours as needed.   traZODone 50 MG tablet Commonly known as: DESYREL TAKE 2 TABLETS AT BEDTIME What changed:  how much to take how to take this when to take this additional instructions   triamcinolone cream 0.1 % Commonly known as: KENALOG Apply 1 application. topically daily as needed for rash.   vitamin B-12 1000 MCG tablet Commonly known as: CYANOCOBALAMIN Take 1,000 mcg by mouth daily.   Vitamin D3 25 MCG (1000 UT) Caps Take 1,000 Units by mouth daily.        Allergies  Allergen Reactions   Donepezil Other (See Comments)    POSSIBLE (??) Hallucinations and sleep disturbances   Escitalopram Oxalate Other (See Comments)    UNK reaction   Hydrochlorothiazide Other (See Comments)    UNK reaction    Consultations: None   Procedures/Studies: CT Head Wo Contrast  Result Date: 08/27/2021 CLINICAL DATA:  Altered mental status EXAM: CT HEAD WITHOUT CONTRAST TECHNIQUE: Contiguous axial images were obtained from the base of the skull through the vertex without intravenous contrast. RADIATION DOSE REDUCTION: This exam was performed according to the departmental dose-optimization program which includes automated exposure control, adjustment of the mA and/or kV according to patient size and/or use of iterative reconstruction technique. COMPARISON:  02/16/2020 FINDINGS: Brain: No evidence of acute infarction, hemorrhage, hydrocephalus, extra-axial collection or mass lesion/mass effect. Chronic atrophic and ischemic changes are noted. Vascular: No hyperdense vessel or unexpected calcification. Skull: Normal. Negative for fracture or focal lesion. Sinuses/Orbits: No acute finding. Other: None. IMPRESSION: Chronic atrophic and ischemic changes stable from the prior exam. No acute abnormality noted. Electronically Signed   By: Inez Catalina M.D.   On: 08/27/2021 22:41   CT Angio Chest PE W/Cm &/Or Wo Cm  Result Date: 08/28/2021 CLINICAL DATA:  Cough and altered mental  status EXAM: CT ANGIOGRAPHY CHEST WITH CONTRAST TECHNIQUE: Multidetector CT imaging of the chest was performed using the standard protocol during bolus administration of intravenous contrast. Multiplanar CT image reconstructions and MIPs were obtained to evaluate the vascular anatomy. RADIATION DOSE REDUCTION: This exam was performed according to the departmental dose-optimization program which includes automated exposure control, adjustment of the mA and/or kV according to patient size and/or use of iterative reconstruction technique. CONTRAST:  74mL OMNIPAQUE IOHEXOL 350 MG/ML SOLN COMPARISON:  Chest x-ray from the previous day. FINDINGS: Cardiovascular: Thoracic aorta shows a normal branching pattern. Atherosclerotic calcifications are noted without aneurysmal dilatation or dissection. Heart is mildly enlarged in size. Coronary calcifications are noted. The pulmonary artery shows filling defects throughout both pulmonary arteries  but primarily within the lower lobes bilaterally. This is consistent with extensive pulmonary emboli. No evidence of right heart strain is seen. Mediastinum/Nodes: Thoracic inlet is within normal limits. Esophagus is within normal limits. No sizable hilar or mediastinal adenopathy is noted. Lungs/Pleura: Mucous is noted throughout the trachea and bronchial tree bilaterally. Lungs are well aerated bilaterally. Left lower lobe consolidation is noted. No sizable parenchymal nodules seen. Upper Abdomen: Visualized upper abdomen is within normal limits. Musculoskeletal: Degenerative changes of the thoracic spine are noted. No acute rib abnormality is seen. Review of the MIP images confirms the above findings. IMPRESSION: Bilateral pulmonary emboli without evidence of right heart strain. Mild left lower lobe consolidation. No pulmonary emboli this may represent early changes of pulmonary infarct. Cardiomegaly. Aortic Atherosclerosis (ICD10-I70.0). Critical Value/emergent results were called by  telephone at the time of interpretation on 08/28/2021 at 1:28 am to Dr. Dayna Barker, who verbally acknowledged these results. Electronically Signed   By: Inez Catalina M.D.   On: 08/28/2021 01:32   DG Chest Port 1 View  Result Date: 08/27/2021 CLINICAL DATA:  Cough. EXAM: PORTABLE CHEST 1 VIEW COMPARISON:  Chest x-ray 10/16/2020 FINDINGS: The heart size and mediastinal contours are within normal limits. Both lungs are clear. The visualized skeletal structures are unremarkable. IMPRESSION: No active disease. Electronically Signed   By: Ronney Asters M.D.   On: 08/27/2021 17:11   ECHOCARDIOGRAM COMPLETE  Result Date: 08/28/2021    ECHOCARDIOGRAM REPORT   Patient Name:   [name] Date of Exam: 08/28/2021 Medical Rec #:  [id number]     Height:       66.0 in Accession #:    [id number]    Weight:       142.0 lb Date of Birth:  Dec 08, 1935     BSA:          1.729 m Patient Age:    86 years      BP:           114/56 mmHg Patient Gender: F             HR:           75 bpm. Exam Location:  Inpatient Procedure: 2D Echo Indications:    Pulmonary embolism  History:        Patient has no prior history of Echocardiogram examinations.                 Risk Factors:Hypertension.  Sonographer:    Jefferey Pica Referring Phys: [id number] JAN A MANSY  Sonographer Comments: Image acquisition challenging due to respiratory motion. IMPRESSIONS  1. Left ventricular ejection fraction, by estimation, is 55 to 60%. The left ventricle has normal function. The left ventricle has no regional wall motion abnormalities. There is mild concentric left ventricular hypertrophy. Left ventricular diastolic parameters are consistent with Grade II diastolic dysfunction (pseudonormalization). Elevated left atrial pressure.  2. Right ventricular systolic function is normal. The right ventricular size is normal. There is severely elevated pulmonary artery systolic pressure. The estimated right ventricular systolic pressure is 96.2 mmHg.  3. The mitral valve  is degenerative. Trivial mitral valve regurgitation. Mild mitral stenosis. The mean mitral valve gradient is 5.0 mmHg with average heart rate of 79 bpm. MVA by Continuity equation 1.51 cm2.  4. Tricuspid valve regurgitation is moderate.  5. The aortic valve is calcified. Aortic valve regurgitation is not visualized. Aortic valve sclerosis is present, with no evidence of aortic valve stenosis. Visually calcified, DVI 0.64, mean gradient 8  mm Hg.  6. Left atrial size was mildly dilated.  7. Right atrial size was mildly dilated.  8. The inferior vena cava is dilated in size with <50% respiratory variability, suggesting right atrial pressure of 15 mmHg. Comparison(s): No prior Echocardiogram. FINDINGS  Left Ventricle: Left ventricular ejection fraction, by estimation, is 55 to 60%. The left ventricle has normal function. The left ventricle has no regional wall motion abnormalities. The left ventricular internal cavity size was normal in size. There is  mild concentric left ventricular hypertrophy. Left ventricular diastolic parameters are consistent with Grade II diastolic dysfunction (pseudonormalization). Elevated left atrial pressure. Right Ventricle: The right ventricular size is normal. No increase in right ventricular wall thickness. Right ventricular systolic function is normal. There is severely elevated pulmonary artery systolic pressure. The tricuspid regurgitant velocity is 3.73 m/s, and with an assumed right atrial pressure of 15 mmHg, the estimated right ventricular systolic pressure is 51.8 mmHg. Left Atrium: Left atrial size was mildly dilated. Right Atrium: Right atrial size was mildly dilated. Pericardium: Trivial pericardial effusion is present. Mitral Valve: The mitral valve is degenerative in appearance. Trivial mitral valve regurgitation. Mild mitral valve stenosis. The mean mitral valve gradient is 5.0 mmHg with average heart rate of 79 bpm. Tricuspid Valve: The tricuspid valve is normal in  structure. Tricuspid valve regurgitation is moderate . No evidence of tricuspid stenosis. Aortic Valve: The aortic valve is calcified. Aortic valve regurgitation is not visualized. Aortic valve sclerosis is present, with no evidence of aortic valve stenosis. Aortic valve peak gradient measures 15.6 mmHg. Pulmonic Valve: The pulmonic valve was not well visualized. Pulmonic valve regurgitation is not visualized. No evidence of pulmonic stenosis. Aorta: The aortic root is normal in size and structure. Venous: The inferior vena cava is dilated in size with less than 50% respiratory variability, suggesting right atrial pressure of 15 mmHg. IAS/Shunts: No atrial level shunt detected by color flow Doppler.  LEFT VENTRICLE PLAX 2D LVIDd:         4.00 cm   Diastology LVIDs:         2.85 cm   LV e' medial:    5.27 cm/s LV PW:         1.10 cm   LV E/e' medial:  23.5 LV IVS:        1.10 cm   LV e' lateral:   3.81 cm/s LVOT diam:     2.00 cm   LV E/e' lateral: 32.5 LV SV:         86 LV SV Index:   50 LVOT Area:     3.14 cm  RIGHT VENTRICLE             IVC RV Basal diam:  2.90 cm     IVC diam: 3.10 cm RV S prime:     15.60 cm/s TAPSE (M-mode): 2.5 cm LEFT ATRIUM             Index        RIGHT ATRIUM           Index LA diam:        3.30 cm 1.91 cm/m   RA Area:     18.85 cm LA Vol (A2C):   58.6 ml 33.89 ml/m  RA Volume:   57.55 ml  33.29 ml/m LA Vol (A4C):   42.6 ml 24.64 ml/m LA Biplane Vol: 50.8 ml 29.38 ml/m  AORTIC VALVE  PULMONIC VALVE AV Area (Vmax): 1.88 cm     PV Vmax:       1.01 m/s AV Vmax:        197.50 cm/s  PV Peak grad:  4.1 mmHg AV Peak Grad:   15.6 mmHg LVOT Vmax:      118.00 cm/s LVOT Vmean:     68.900 cm/s LVOT VTI:       0.273 m  AORTA Ao Root diam: 3.60 cm MITRAL VALVE                TRICUSPID VALVE MV Area (PHT): 2.44 cm     TR Peak grad:   55.7 mmHg MV Mean grad:  5.0 mmHg     TR Vmax:        373.00 cm/s MV Decel Time: 311 msec MV E velocity: 124.00 cm/s  SHUNTS MV A velocity: 192.00  cm/s  Systemic VTI:  0.27 m MV E/A ratio:  0.65         Systemic Diam: 2.00 cm Rudean Haskell MD Electronically signed by Rudean Haskell MD Signature Date/Time: 08/28/2021/3:11:46 PM    Final    VAS [place] LOWER EXTREMITY VENOUS (DVT)  Result Date: 08/28/2021  Lower Venous DVT Study Patient Name:  TAELAR GRONEWOLD  Date of Exam:   08/28/2021 Medical Rec #: [id number]      Accession #:    [id number] Date of Birth: 1936-06-03      Patient Gender: F Patient Age:   48 years Exam Location:  [facility] Procedure:      VAS [place] LOWER EXTREMITY VENOUS (DVT) Referring Phys: Eugenie Norrie --------------------------------------------------------------------------------  Indications: Pulmonary embolism.  Comparison Study: No prior studies. Performing Technologist: Darlin Coco RDMS, RVT  Examination Guidelines: A complete evaluation includes B-mode imaging, spectral Doppler, color Doppler, and power Doppler as needed of all accessible portions of each vessel. Bilateral testing is considered an integral part of a complete examination. Limited examinations for reoccurring indications may be performed as noted. The reflux portion of the exam is performed with the patient in reverse Trendelenburg.  +---------+---------------+---------+-----------+----------+--------------+  RIGHT     Compressibility Phasicity Spontaneity Properties Thrombus Aging  +---------+---------------+---------+-----------+----------+--------------+  CFV       Full            Yes       Yes                                    +---------+---------------+---------+-----------+----------+--------------+  SFJ       Full                                                             +---------+---------------+---------+-----------+----------+--------------+  FV Prox   Full                                                             +---------+---------------+---------+-----------+----------+--------------+  FV Mid    Full                                                              +---------+---------------+---------+-----------+----------+--------------+  FV Distal Full                                                             +---------+---------------+---------+-----------+----------+--------------+  PFV       Full                                                             +---------+---------------+---------+-----------+----------+--------------+  POP       Full                                                             +---------+---------------+---------+-----------+----------+--------------+  PTV       Full                                                             +---------+---------------+---------+-----------+----------+--------------+  PERO      None            No        No                     Acute           +---------+---------------+---------+-----------+----------+--------------+  Gastroc   Full                                                             +---------+---------------+---------+-----------+----------+--------------+   +---------+---------------+---------+-----------+----------+--------------+  LEFT      Compressibility Phasicity Spontaneity Properties Thrombus Aging  +---------+---------------+---------+-----------+----------+--------------+  CFV       Full            Yes       Yes                                    +---------+---------------+---------+-----------+----------+--------------+  SFJ       Full                                                             +---------+---------------+---------+-----------+----------+--------------+  FV Prox   Full                                                             +---------+---------------+---------+-----------+----------+--------------+  FV Mid    Full                                                             +---------+---------------+---------+-----------+----------+--------------+  FV Distal Full                                                              +---------+---------------+---------+-----------+----------+--------------+  PFV       Full                                                             +---------+---------------+---------+-----------+----------+--------------+  POP       Full            Yes       Yes                                    +---------+---------------+---------+-----------+----------+--------------+  PTV       Full                                                             +---------+---------------+---------+-----------+----------+--------------+  PERO      Full                                                             +---------+---------------+---------+-----------+----------+--------------+  Gastroc   Full                                                             +---------+---------------+---------+-----------+----------+--------------+     Summary: RIGHT: - Findings consistent with acute deep vein thrombosis involving the right peroneal veins.  - No cystic structure found in the popliteal fossa.  LEFT: - There is no evidence of deep vein thrombosis in the lower extremity.  - No cystic structure found in the popliteal fossa.  *See table(s) above for measurements and observations. Electronically signed by Monica Martinez MD on 08/28/2021 at 5:10:54 PM.    Final       Subjective: No significant events overnight as discussed with staff, patient is demented, pleasant, she denies any complaints.  Discharge Exam: Vitals:   08/31/21 1106 08/31/21 1131  BP:  (!) 138/58  Pulse: 78 86  Resp: 14 14  Temp:  97.9 F (  36.6 C)  SpO2: 95% 93%   Vitals:   08/31/21 0715 08/31/21 1006 08/31/21 1106 08/31/21 1131  BP: 127/62   (!) 138/58  Pulse: 69 75 78 86  Resp: $Remo'15 14 14 14  'TEGiU$ Temp: 98.7 F (37.1 C)   97.9 F (36.6 C)  TempSrc:    Oral  SpO2: 98% 91% 95% 93%  Weight:      Height:        General: Pt is alert, pleasantly demented  cardiovascular: RRR, S1/S2 +, no rubs, no gallops Respiratory: CTA bilaterally, no  wheezing, no rhonchi Abdominal: Soft, NT, ND, bowel sounds + Extremities: no edema, no cyanosis    The results of significant diagnostics from this hospitalization (including imaging, microbiology, ancillary and laboratory) are listed below for reference.     Microbiology: Recent Results (from the past 240 hour(s))  Resp Panel by RT-PCR (Flu A&B, Covid) Nasopharyngeal Swab     Status: None   Collection Time: 08/27/21  4:56 PM   Specimen: Nasopharyngeal Swab; Nasopharyngeal(NP) swabs in vial transport medium  Result Value Ref Range Status   SARS Coronavirus 2 by RT PCR NEGATIVE NEGATIVE Final    Comment: (NOTE) SARS-CoV-2 target nucleic acids are NOT DETECTED.  The SARS-CoV-2 RNA is generally detectable in upper respiratory specimens during the acute phase of infection. The lowest concentration of SARS-CoV-2 viral copies this assay can detect is 138 copies/mL. A negative result does not preclude SARS-Cov-2 infection and should not be used as the sole basis for treatment or other patient management decisions. A negative result may occur with  improper specimen collection/handling, submission of specimen other than nasopharyngeal swab, presence of viral mutation(s) within the areas targeted by this assay, and inadequate number of viral copies(<138 copies/mL). A negative result must be combined with clinical observations, patient history, and epidemiological information. The expected result is Negative.  Fact Sheet for Patients:  EntrepreneurPulse.com.au  Fact Sheet for Healthcare Providers:  IncredibleEmployment.be  This test is no t yet approved or cleared by the [place] FDA and  has been authorized for detection and/or diagnosis of SARS-CoV-2 by FDA under an Emergency Use Authorization (EUA). This EUA will remain  in effect (meaning this test can be used) for the duration of the COVID-19 declaration under Section 564(b)(1) of the Act,  21 U.S.C.section 360bbb-3(b)(1), unless the authorization is terminated  or revoked sooner.       Influenza A by PCR NEGATIVE NEGATIVE Final   Influenza B by PCR NEGATIVE NEGATIVE Final    Comment: (NOTE) The Xpert Xpress SARS-CoV-2/FLU/RSV plus assay is intended as an aid in the diagnosis of influenza from Nasopharyngeal swab specimens and should not be used as a sole basis for treatment. Nasal washings and aspirates are unacceptable for Xpert Xpress SARS-CoV-2/FLU/RSV testing.  Fact Sheet for Patients: EntrepreneurPulse.com.au  Fact Sheet for Healthcare Providers: IncredibleEmployment.be  This test is not yet approved or cleared by the [place] FDA and has been authorized for detection and/or diagnosis of SARS-CoV-2 by FDA under an Emergency Use Authorization (EUA). This EUA will remain in effect (meaning this test can be used) for the duration of the COVID-19 declaration under Section 564(b)(1) of the Act, 21 U.S.C. section 360bbb-3(b)(1), unless the authorization is terminated or revoked.  Performed at [facility], [place] [address], [place], [place] [address]   Culture, blood (routine x 2)     Status: None (Preliminary result)   Collection Time: 08/27/21  5:03 PM   Specimen: BLOOD  Result  Value Ref Range Status   Specimen Description BLOOD BLOOD RIGHT FOREARM  Final   Special Requests   Final    BOTTLES DRAWN AEROBIC AND ANAEROBIC Blood Culture adequate volume   Culture   Final    NO GROWTH 4 DAYS Performed at [facility], [address], [place], [place] [address]    Report Status PENDING  Incomplete  Culture, blood (routine x 2)     Status: None (Preliminary result)   Collection Time: 08/27/21  5:20 PM   Specimen: BLOOD LEFT ARM  Result Value Ref Range Status   Specimen Description BLOOD LEFT ARM  Final   Special Requests   Final    BOTTLES DRAWN AEROBIC AND ANAEROBIC Blood Culture results may not be optimal  due to an inadequate volume of blood received in culture bottles   Culture   Final    NO GROWTH 4 DAYS Performed at [facility], [place] [address], [place], [place] [address]    Report Status PENDING  Incomplete     Labs: BNP (last 3 results) Recent Labs    08/27/21 1655  BNP 29.9   Basic Metabolic Panel: Recent Labs  Lab 08/27/21 1655 08/27/21 1708 08/28/21 0426 08/28/21 0839 08/29/21 0102 08/29/21 2354  NA 135 135 136 139 137 140  K 4.3 4.3 3.7 3.8 3.8 3.8  CL 102  --  107  --  107 108  CO2 23  --  22  --  22 25  GLUCOSE 112*  --  104*  --  111* 126*  BUN 28*  --  24*  --  17 15  CREATININE 1.09*  --  0.94  --  0.97 0.87  CALCIUM 8.5*  --  7.2*  --  7.8* 7.9*   Liver Function Tests: Recent Labs  Lab 08/27/21 1655  AST 23  ALT 16  ALKPHOS 46  BILITOT 0.6  PROT 6.4*  ALBUMIN 3.5   No results for input(s): LIPASE, AMYLASE in the last 168 hours. No results for input(s): AMMONIA in the last 168 hours. CBC: Recent Labs  Lab 08/27/21 1655 08/27/21 1708 08/28/21 0426 08/28/21 0839 08/29/21 0102 08/29/21 2354 08/31/21 0137  WBC 6.2  --  6.4  --  7.7 5.9 6.0  NEUTROABS 5.3  --   --   --   --   --   --   HGB 11.3*   < > 9.5* 9.5* 9.4* 9.5* 10.0*  HCT 37.3   < > 30.9* 28.0* 30.4* 30.6* 31.8*  MCV 89.2  --  89.0  --  87.1 87.2 87.8  PLT 163  --  142*  --  160 170 201   < > = values in this interval not displayed.   Cardiac Enzymes: No results for input(s): CKTOTAL, CKMB, CKMBINDEX, TROPONINI in the last 168 hours. BNP: Invalid input(s): POCBNP CBG: Recent Labs  Lab 08/27/21 1656 08/28/21 1633  GLUCAP 105* 111*   D-Dimer No results for input(s): DDIMER in the last 72 hours. Hgb A1c No results for input(s): HGBA1C in the last 72 hours. Lipid Profile No results for input(s): CHOL, HDL, LDLCALC, TRIG, CHOLHDL, LDLDIRECT in the last 72 hours. Thyroid function studies No results for input(s): TSH, T4TOTAL, T3FREE, THYROIDAB in the last 72  hours.  Invalid input(s): FREET3 Anemia work up No results for input(s): VITAMINB12, FOLATE, FERRITIN, TIBC, IRON, RETICCTPCT in the last 72 hours. Urinalysis    Component Value Date/Time   COLORURINE YELLOW 08/27/2021 1656  APPEARANCEUR CLEAR 08/27/2021 1656   LABSPEC 1.017 08/27/2021 1656   PHURINE 5.0 08/27/2021 1656   GLUCOSEU NEGATIVE 08/27/2021 1656   HGBUR NEGATIVE 08/27/2021 1656   BILIRUBINUR NEGATIVE 08/27/2021 1656   KETONESUR NEGATIVE 08/27/2021 1656   PROTEINUR NEGATIVE 08/27/2021 1656   NITRITE NEGATIVE 08/27/2021 1656   LEUKOCYTESUR NEGATIVE 08/27/2021 1656   Sepsis Labs Invalid input(s): PROCALCITONIN,  WBC,  LACTICIDVEN Microbiology Recent Results (from the past 240 hour(s))  Resp Panel by RT-PCR (Flu A&B, Covid) Nasopharyngeal Swab     Status: None   Collection Time: 08/27/21  4:56 PM   Specimen: Nasopharyngeal Swab; Nasopharyngeal(NP) swabs in vial transport medium  Result Value Ref Range Status   SARS Coronavirus 2 by RT PCR NEGATIVE NEGATIVE Final    Comment: (NOTE) SARS-CoV-2 target nucleic acids are NOT DETECTED.  The SARS-CoV-2 RNA is generally detectable in upper respiratory specimens during the acute phase of infection. The lowest concentration of SARS-CoV-2 viral copies this assay can detect is 138 copies/mL. A negative result does not preclude SARS-Cov-2 infection and should not be used as the sole basis for treatment or other patient management decisions. A negative result may occur with  improper specimen collection/handling, submission of specimen other than nasopharyngeal swab, presence of viral mutation(s) within the areas targeted by this assay, and inadequate number of viral copies(<138 copies/mL). A negative result must be combined with clinical observations, patient history, and epidemiological information. The expected result is Negative.  Fact Sheet for Patients:  BloggerCourse.com  Fact Sheet for  Healthcare Providers:  SeriousBroker.it  This test is no t yet approved or cleared by the [place] FDA and  has been authorized for detection and/or diagnosis of SARS-CoV-2 by FDA under an Emergency Use Authorization (EUA). This EUA will remain  in effect (meaning this test can be used) for the duration of the COVID-19 declaration under Section 564(b)(1) of the Act, 21 U.S.C.section 360bbb-3(b)(1), unless the authorization is terminated  or revoked sooner.       Influenza A by PCR NEGATIVE NEGATIVE Final   Influenza B by PCR NEGATIVE NEGATIVE Final    Comment: (NOTE) The Xpert Xpress SARS-CoV-2/FLU/RSV plus assay is intended as an aid in the diagnosis of influenza from Nasopharyngeal swab specimens and should not be used as a sole basis for treatment. Nasal washings and aspirates are unacceptable for Xpert Xpress SARS-CoV-2/FLU/RSV testing.  Fact Sheet for Patients: BloggerCourse.com  Fact Sheet for Healthcare Providers: SeriousBroker.it  This test is not yet approved or cleared by the [place] FDA and has been authorized for detection and/or diagnosis of SARS-CoV-2 by FDA under an Emergency Use Authorization (EUA). This EUA will remain in effect (meaning this test can be used) for the duration of the COVID-19 declaration under Section 564(b)(1) of the Act, 21 U.S.C. section 360bbb-3(b)(1), unless the authorization is terminated or revoked.  Performed at [facility], [address], [place], [place] [address]   Culture, blood (routine x 2)     Status: None (Preliminary result)   Collection Time: 08/27/21  5:03 PM   Specimen: BLOOD  Result Value Ref Range Status   Specimen Description BLOOD BLOOD RIGHT FOREARM  Final   Special Requests   Final    BOTTLES DRAWN AEROBIC AND ANAEROBIC Blood Culture adequate volume   Culture   Final    NO GROWTH 4 DAYS Performed at [facility], [address], [place], [place] [address]    Report Status PENDING  Incomplete  Culture, blood (routine x 2)     Status: None (Preliminary result)   Collection Time: 08/27/21  5:20 PM   Specimen: BLOOD LEFT ARM  Result Value Ref Range Status   Specimen Description BLOOD LEFT ARM  Final   Special Requests   Final    BOTTLES DRAWN AEROBIC AND ANAEROBIC Blood Culture results may not be optimal due to an inadequate volume of blood received in culture bottles   Culture   Final    NO GROWTH 4 DAYS Performed at [facility], [place] [address], [place], [place] [address]    Report Status PENDING  Incomplete     Time coordinating discharge: Over 30 minutes  SIGNED:   Phillips Climes, MD  [facility] 08/31/2021, 11:35 AM Pager   If 7PM-7AM, please contact night-coverage [email or web address] Password [email or web address]

## 2021-08-31 NOTE — TOC Benefit Eligibility Note (Signed)
Patient Advocate Encounter ? ?Insurance verification completed.   ? ?The patient is currently admitted and upon discharge could be taking Eliquis 5 mg. ? ?The current 30 day co-pay is, $47.00.  ? ?The patient is insured through [employer] Medicare Part D  ? ? ? ?Pairlee Sawtell, CPhT ?Pharmacy Patient Advocate Specialist ?[facility] Patient Advocate Team ?Direct Number: [phone]  Fax: [phone] ? ? ? ? ? ?  ?

## 2021-08-31 NOTE — TOC Transition Note (Signed)
Transition of Care (TOC) - CM/SW Discharge Note ? ? ?Patient Details  ?Name: [name] ?MRN: [id number] ?Date of Birth: May 01, 1936 ? ?Transition of Care (TOC) CM/SW Contact:  ?Harriet Masson, RN ?Phone Number: ?08/31/2021, 1:28 PM ? ? ?Clinical Narrative:    ?Patient stable for discharge. ?Granddaughter, [name], notified of discharge. [name] will come pick up patient when ready. Home Health arranged with [facility] per family's request. Patient has all needed DME at home.  ? ? ? ?Final next level of care: Home w Home Health Services ?Barriers to Discharge: Barriers Resolved ? ? ?Patient Goals and CMS Choice ?Patient states their goals for this hospitalization and ongoing recovery are:: granddaughter want to take her home ?CMS Medicare.gov Compare Post Acute Care list provided to:: Patient Represenative (must comment) (granddaughter) ?  ? ?Discharge Placement ?  ?          home ?  ?  ?  ?  ? ?Discharge Plan and Services ?  ?  ?          Home with HH ?  ?  ?  ?  ?  ?HH Arranged: PT, OT ?HH Agency: [facility] ?Date HH Agency Contacted: 08/31/21 ?Time HH Agency Contacted: [phone] ?Representative spoke with at [facility] Agency: Stacie ? ?Social Determinants of Health (SDOH) Interventions ?  ? ? ?Readmission Risk Interventions ?Readmission Risk Prevention Plan 08/31/2021  ?Post Dischage Appt Complete  ?Medication Screening Complete  ?Transportation Screening Complete  ?Some recent data might be hidden  ? ? ? ? ? ?

## 2021-08-31 NOTE — Care Management Important Message (Signed)
Important Message ? ?Patient Details  ?Name: [name] ?MRN: [id number] ?Date of Birth: [birth year] ? ? ?Medicare Important Message Given:  Yes ? ? ? ? ?[name] ?08/31/2021, 4:15 PM ?

## 2021-09-01 LAB — CULTURE, BLOOD (ROUTINE X 2)
Culture: NO GROWTH
Culture: NO GROWTH
Special Requests: ADEQUATE

## 2021-09-05 DIAGNOSIS — G4700 Insomnia, unspecified: Secondary | ICD-10-CM | POA: Diagnosis not present

## 2021-09-05 DIAGNOSIS — I872 Venous insufficiency (chronic) (peripheral): Secondary | ICD-10-CM | POA: Diagnosis not present

## 2021-09-05 DIAGNOSIS — L97822 Non-pressure chronic ulcer of other part of left lower leg with fat layer exposed: Secondary | ICD-10-CM | POA: Diagnosis not present

## 2021-09-05 DIAGNOSIS — F419 Anxiety disorder, unspecified: Secondary | ICD-10-CM | POA: Diagnosis not present

## 2021-09-05 DIAGNOSIS — E039 Hypothyroidism, unspecified: Secondary | ICD-10-CM | POA: Diagnosis not present

## 2021-09-05 DIAGNOSIS — F32A Depression, unspecified: Secondary | ICD-10-CM | POA: Diagnosis not present

## 2021-09-05 DIAGNOSIS — I10 Essential (primary) hypertension: Secondary | ICD-10-CM | POA: Diagnosis not present

## 2021-09-05 DIAGNOSIS — F0280 Dementia in other diseases classified elsewhere without behavioral disturbance: Secondary | ICD-10-CM | POA: Diagnosis not present

## 2021-09-05 DIAGNOSIS — L89312 Pressure ulcer of right buttock, stage 2: Secondary | ICD-10-CM | POA: Diagnosis not present

## 2021-09-07 ENCOUNTER — Ambulatory Visit
Admission: RE | Admit: 2021-09-07 | Discharge: 2021-09-07 | Disposition: A | Discharge status: Home / Self Care | Payer: Medicare PPO | Source: Ambulatory Visit | Attending: Family Medicine | Admitting: Family Medicine

## 2021-09-07 ENCOUNTER — Other Ambulatory Visit (HOSPITAL_COMMUNITY): Payer: Self-pay

## 2021-09-07 ENCOUNTER — Other Ambulatory Visit: Payer: Self-pay

## 2021-09-07 ENCOUNTER — Other Ambulatory Visit: Payer: Self-pay | Admitting: Family Medicine

## 2021-09-07 DIAGNOSIS — Z8619 Personal history of other infectious and parasitic diseases: Secondary | ICD-10-CM | POA: Diagnosis not present

## 2021-09-07 DIAGNOSIS — J189 Pneumonia, unspecified organism: Secondary | ICD-10-CM

## 2021-09-07 DIAGNOSIS — R7303 Prediabetes: Secondary | ICD-10-CM | POA: Diagnosis not present

## 2021-09-07 DIAGNOSIS — E538 Deficiency of other specified B group vitamins: Secondary | ICD-10-CM | POA: Diagnosis not present

## 2021-09-07 DIAGNOSIS — I2699 Other pulmonary embolism without acute cor pulmonale: Secondary | ICD-10-CM | POA: Diagnosis not present

## 2021-09-07 DIAGNOSIS — I82401 Acute embolism and thrombosis of unspecified deep veins of right lower extremity: Secondary | ICD-10-CM | POA: Diagnosis not present

## 2021-09-07 DIAGNOSIS — Z09 Encounter for follow-up examination after completed treatment for conditions other than malignant neoplasm: Secondary | ICD-10-CM | POA: Diagnosis not present

## 2021-09-07 DIAGNOSIS — Z8701 Personal history of pneumonia (recurrent): Secondary | ICD-10-CM | POA: Diagnosis not present

## 2021-09-07 DIAGNOSIS — F0390 Unspecified dementia without behavioral disturbance: Secondary | ICD-10-CM | POA: Diagnosis not present

## 2021-09-08 ENCOUNTER — Telehealth (HOSPITAL_COMMUNITY): Payer: Self-pay

## 2021-09-08 NOTE — Telephone Encounter (Signed)
Pharmacy Transitions of Care Follow-up Telephone Call ? ?Date of discharge: 08/31/21  ?Discharge Diagnosis: Bilateral PE ? ?How have you been since you were released from the hospital? Patient is doing well since discharge, has home health coming in for patient assistance and no questions about meds at this time.  ? ?Medication changes made at discharge: ?    START taking: ?azithromycin (ZITHROMAX)  ?cefdinir (OMNICEF)  ?Eliquis (apixaban)  ?CHANGE how you take: ?acetaminophen (TYLENOL)  ?levothyroxine (SYNTHROID)  ?STOP taking: ?aspirin EC 81 MG tablet  ? ?Medication changes verified by the patient? Yes ?  ? ?Medication Accessibility: ? ?Home Pharmacy: Not discussed  ? ?Was the patient provided with refills on discharged medications? No  ? ?Have all prescriptions been transferred from [facility] to home pharmacy? N/A, patient has seen PCP and has refills at home pharmacy  ? ?Is the patient able to afford medications? Has insurance ?  ? ?Medication Review: ? ?APIXABAN (ELIQUIS)  ?Apixaban 10 mg BID initiated on 08/31/21. Will switch to apixaban 5 mg BID after 7 days (DATE 09/07/21).  ?- Discussed importance of taking medication around the same time everyday  ?- Reviewed potential DDIs with patient  ?- Advised patient of medications to avoid (NSAIDs, ASA)  ?- Educated that Tylenol (acetaminophen) will be the preferred analgesic to prevent risk of bleeding  ?- Emphasized importance of monitoring for signs and symptoms of bleeding (abnormal bruising, prolonged bleeding, nose bleeds, bleeding from gums, discolored urine, black tarry stools)  ?- Advised patient to alert all providers of anticoagulation therapy prior to starting a new medication or having a procedure  ? ? ?Follow-up Appointments: ? ?PCP Hospital f/u appt confirmed? Seen by PCP on 09/07/21 ? ?If their condition worsens, is the pt aware to call PCP or go to the Emergency Dept.? Yes ? ?Final Patient Assessment: ?Patient has had f/u and has refills at home pharmacy ? ?

## 2021-09-09 DIAGNOSIS — G9341 Metabolic encephalopathy: Secondary | ICD-10-CM | POA: Diagnosis not present

## 2021-09-09 DIAGNOSIS — E039 Hypothyroidism, unspecified: Secondary | ICD-10-CM | POA: Diagnosis not present

## 2021-09-09 DIAGNOSIS — J188 Other pneumonia, unspecified organism: Secondary | ICD-10-CM | POA: Diagnosis not present

## 2021-09-09 DIAGNOSIS — I10 Essential (primary) hypertension: Secondary | ICD-10-CM | POA: Diagnosis not present

## 2021-09-09 DIAGNOSIS — I82401 Acute embolism and thrombosis of unspecified deep veins of right lower extremity: Secondary | ICD-10-CM | POA: Diagnosis not present

## 2021-09-09 DIAGNOSIS — A419 Sepsis, unspecified organism: Secondary | ICD-10-CM | POA: Diagnosis not present

## 2021-09-09 DIAGNOSIS — I872 Venous insufficiency (chronic) (peripheral): Secondary | ICD-10-CM | POA: Diagnosis not present

## 2021-09-09 DIAGNOSIS — J9690 Respiratory failure, unspecified, unspecified whether with hypoxia or hypercapnia: Secondary | ICD-10-CM | POA: Diagnosis not present

## 2021-09-09 DIAGNOSIS — I2699 Other pulmonary embolism without acute cor pulmonale: Secondary | ICD-10-CM | POA: Diagnosis not present

## 2021-09-17 DIAGNOSIS — E039 Hypothyroidism, unspecified: Secondary | ICD-10-CM | POA: Diagnosis not present

## 2021-09-17 DIAGNOSIS — I10 Essential (primary) hypertension: Secondary | ICD-10-CM | POA: Diagnosis not present

## 2021-09-17 DIAGNOSIS — I2699 Other pulmonary embolism without acute cor pulmonale: Secondary | ICD-10-CM | POA: Diagnosis not present

## 2021-09-17 DIAGNOSIS — J188 Other pneumonia, unspecified organism: Secondary | ICD-10-CM | POA: Diagnosis not present

## 2021-09-17 DIAGNOSIS — I872 Venous insufficiency (chronic) (peripheral): Secondary | ICD-10-CM | POA: Diagnosis not present

## 2021-09-17 DIAGNOSIS — I82401 Acute embolism and thrombosis of unspecified deep veins of right lower extremity: Secondary | ICD-10-CM | POA: Diagnosis not present

## 2021-09-17 DIAGNOSIS — G9341 Metabolic encephalopathy: Secondary | ICD-10-CM | POA: Diagnosis not present

## 2021-09-17 DIAGNOSIS — A419 Sepsis, unspecified organism: Secondary | ICD-10-CM | POA: Diagnosis not present

## 2021-09-17 DIAGNOSIS — J9690 Respiratory failure, unspecified, unspecified whether with hypoxia or hypercapnia: Secondary | ICD-10-CM | POA: Diagnosis not present

## 2021-10-02 DIAGNOSIS — I10 Essential (primary) hypertension: Secondary | ICD-10-CM | POA: Diagnosis not present

## 2021-10-02 DIAGNOSIS — I872 Venous insufficiency (chronic) (peripheral): Secondary | ICD-10-CM | POA: Diagnosis not present

## 2021-10-02 DIAGNOSIS — G9341 Metabolic encephalopathy: Secondary | ICD-10-CM | POA: Diagnosis not present

## 2021-10-02 DIAGNOSIS — J188 Other pneumonia, unspecified organism: Secondary | ICD-10-CM | POA: Diagnosis not present

## 2021-10-02 DIAGNOSIS — E039 Hypothyroidism, unspecified: Secondary | ICD-10-CM | POA: Diagnosis not present

## 2021-10-02 DIAGNOSIS — J9690 Respiratory failure, unspecified, unspecified whether with hypoxia or hypercapnia: Secondary | ICD-10-CM | POA: Diagnosis not present

## 2021-10-02 DIAGNOSIS — I82401 Acute embolism and thrombosis of unspecified deep veins of right lower extremity: Secondary | ICD-10-CM | POA: Diagnosis not present

## 2021-10-02 DIAGNOSIS — I2699 Other pulmonary embolism without acute cor pulmonale: Secondary | ICD-10-CM | POA: Diagnosis not present

## 2021-10-02 DIAGNOSIS — A419 Sepsis, unspecified organism: Secondary | ICD-10-CM | POA: Diagnosis not present

## 2021-10-07 ENCOUNTER — Other Ambulatory Visit (HOSPITAL_COMMUNITY): Payer: Self-pay

## 2021-10-09 DIAGNOSIS — E039 Hypothyroidism, unspecified: Secondary | ICD-10-CM | POA: Diagnosis not present

## 2021-10-09 DIAGNOSIS — J9690 Respiratory failure, unspecified, unspecified whether with hypoxia or hypercapnia: Secondary | ICD-10-CM | POA: Diagnosis not present

## 2021-10-09 DIAGNOSIS — G9341 Metabolic encephalopathy: Secondary | ICD-10-CM | POA: Diagnosis not present

## 2021-10-09 DIAGNOSIS — A419 Sepsis, unspecified organism: Secondary | ICD-10-CM | POA: Diagnosis not present

## 2021-10-09 DIAGNOSIS — I872 Venous insufficiency (chronic) (peripheral): Secondary | ICD-10-CM | POA: Diagnosis not present

## 2021-10-09 DIAGNOSIS — J188 Other pneumonia, unspecified organism: Secondary | ICD-10-CM | POA: Diagnosis not present

## 2021-10-09 DIAGNOSIS — I2699 Other pulmonary embolism without acute cor pulmonale: Secondary | ICD-10-CM | POA: Diagnosis not present

## 2021-10-09 DIAGNOSIS — I82401 Acute embolism and thrombosis of unspecified deep veins of right lower extremity: Secondary | ICD-10-CM | POA: Diagnosis not present

## 2021-10-09 DIAGNOSIS — I10 Essential (primary) hypertension: Secondary | ICD-10-CM | POA: Diagnosis not present

## 2021-10-16 DIAGNOSIS — I10 Essential (primary) hypertension: Secondary | ICD-10-CM | POA: Diagnosis not present

## 2021-10-16 DIAGNOSIS — E039 Hypothyroidism, unspecified: Secondary | ICD-10-CM | POA: Diagnosis not present

## 2021-10-16 DIAGNOSIS — J188 Other pneumonia, unspecified organism: Secondary | ICD-10-CM | POA: Diagnosis not present

## 2021-10-16 DIAGNOSIS — I82401 Acute embolism and thrombosis of unspecified deep veins of right lower extremity: Secondary | ICD-10-CM | POA: Diagnosis not present

## 2021-10-16 DIAGNOSIS — I2699 Other pulmonary embolism without acute cor pulmonale: Secondary | ICD-10-CM | POA: Diagnosis not present

## 2021-10-16 DIAGNOSIS — I872 Venous insufficiency (chronic) (peripheral): Secondary | ICD-10-CM | POA: Diagnosis not present

## 2021-10-16 DIAGNOSIS — J9690 Respiratory failure, unspecified, unspecified whether with hypoxia or hypercapnia: Secondary | ICD-10-CM | POA: Diagnosis not present

## 2021-10-16 DIAGNOSIS — G9341 Metabolic encephalopathy: Secondary | ICD-10-CM | POA: Diagnosis not present

## 2021-10-16 DIAGNOSIS — A419 Sepsis, unspecified organism: Secondary | ICD-10-CM | POA: Diagnosis not present

## 2021-10-30 DIAGNOSIS — J188 Other pneumonia, unspecified organism: Secondary | ICD-10-CM | POA: Diagnosis not present

## 2021-10-30 DIAGNOSIS — E039 Hypothyroidism, unspecified: Secondary | ICD-10-CM | POA: Diagnosis not present

## 2021-10-30 DIAGNOSIS — A419 Sepsis, unspecified organism: Secondary | ICD-10-CM | POA: Diagnosis not present

## 2021-10-30 DIAGNOSIS — I2699 Other pulmonary embolism without acute cor pulmonale: Secondary | ICD-10-CM | POA: Diagnosis not present

## 2021-10-30 DIAGNOSIS — J9690 Respiratory failure, unspecified, unspecified whether with hypoxia or hypercapnia: Secondary | ICD-10-CM | POA: Diagnosis not present

## 2021-10-30 DIAGNOSIS — I872 Venous insufficiency (chronic) (peripheral): Secondary | ICD-10-CM | POA: Diagnosis not present

## 2021-10-30 DIAGNOSIS — I82401 Acute embolism and thrombosis of unspecified deep veins of right lower extremity: Secondary | ICD-10-CM | POA: Diagnosis not present

## 2021-10-30 DIAGNOSIS — I10 Essential (primary) hypertension: Secondary | ICD-10-CM | POA: Diagnosis not present

## 2021-10-30 DIAGNOSIS — G9341 Metabolic encephalopathy: Secondary | ICD-10-CM | POA: Diagnosis not present

## 2021-11-04 DIAGNOSIS — I872 Venous insufficiency (chronic) (peripheral): Secondary | ICD-10-CM | POA: Diagnosis not present

## 2021-11-04 DIAGNOSIS — G9341 Metabolic encephalopathy: Secondary | ICD-10-CM | POA: Diagnosis not present

## 2021-11-04 DIAGNOSIS — A419 Sepsis, unspecified organism: Secondary | ICD-10-CM | POA: Diagnosis not present

## 2021-11-04 DIAGNOSIS — E039 Hypothyroidism, unspecified: Secondary | ICD-10-CM | POA: Diagnosis not present

## 2021-11-04 DIAGNOSIS — I10 Essential (primary) hypertension: Secondary | ICD-10-CM | POA: Diagnosis not present

## 2021-11-04 DIAGNOSIS — J188 Other pneumonia, unspecified organism: Secondary | ICD-10-CM | POA: Diagnosis not present

## 2021-11-04 DIAGNOSIS — J9690 Respiratory failure, unspecified, unspecified whether with hypoxia or hypercapnia: Secondary | ICD-10-CM | POA: Diagnosis not present

## 2021-11-04 DIAGNOSIS — I2699 Other pulmonary embolism without acute cor pulmonale: Secondary | ICD-10-CM | POA: Diagnosis not present

## 2021-11-04 DIAGNOSIS — I82401 Acute embolism and thrombosis of unspecified deep veins of right lower extremity: Secondary | ICD-10-CM | POA: Diagnosis not present

## 2021-11-06 DIAGNOSIS — E039 Hypothyroidism, unspecified: Secondary | ICD-10-CM | POA: Diagnosis not present

## 2021-11-06 DIAGNOSIS — F320 Major depressive disorder, single episode, mild: Secondary | ICD-10-CM | POA: Diagnosis not present

## 2021-11-06 DIAGNOSIS — F0390 Unspecified dementia without behavioral disturbance: Secondary | ICD-10-CM | POA: Diagnosis not present

## 2021-11-06 DIAGNOSIS — I10 Essential (primary) hypertension: Secondary | ICD-10-CM | POA: Diagnosis not present

## 2021-11-06 DIAGNOSIS — E7800 Pure hypercholesterolemia, unspecified: Secondary | ICD-10-CM | POA: Diagnosis not present

## 2021-12-09 DIAGNOSIS — R296 Repeated falls: Secondary | ICD-10-CM | POA: Diagnosis not present

## 2021-12-09 DIAGNOSIS — I10 Essential (primary) hypertension: Secondary | ICD-10-CM | POA: Diagnosis not present

## 2021-12-09 DIAGNOSIS — R7303 Prediabetes: Secondary | ICD-10-CM | POA: Diagnosis not present

## 2021-12-09 DIAGNOSIS — D649 Anemia, unspecified: Secondary | ICD-10-CM | POA: Diagnosis not present

## 2021-12-09 DIAGNOSIS — E039 Hypothyroidism, unspecified: Secondary | ICD-10-CM | POA: Diagnosis not present

## 2021-12-09 DIAGNOSIS — F0390 Unspecified dementia without behavioral disturbance: Secondary | ICD-10-CM | POA: Diagnosis not present

## 2022-01-05 ENCOUNTER — Other Ambulatory Visit: Payer: Self-pay | Admitting: Physician Assistant

## 2022-01-05 ENCOUNTER — Other Ambulatory Visit: Payer: Self-pay

## 2022-01-05 ENCOUNTER — Telehealth: Payer: Self-pay | Admitting: Physician Assistant

## 2022-01-05 ENCOUNTER — Encounter: Payer: Self-pay | Admitting: Physician Assistant

## 2022-01-05 DIAGNOSIS — F02818 Dementia in other diseases classified elsewhere, unspecified severity, with other behavioral disturbance: Secondary | ICD-10-CM

## 2022-01-05 DIAGNOSIS — M1612 Unilateral primary osteoarthritis, left hip: Secondary | ICD-10-CM

## 2022-01-05 DIAGNOSIS — F03918 Unspecified dementia, unspecified severity, with other behavioral disturbance: Secondary | ICD-10-CM

## 2022-01-05 MED ORDER — GABAPENTIN 100 MG PO CAPS: ORAL_CAPSULE | ORAL | 0 refills | Status: DC

## 2022-01-05 NOTE — Telephone Encounter (Signed)
Patient's granddaughter [name] called and left a voice mail requesting a new prescription to be sent for gabapentin.   The patient is completely out.  [facility] ([address] in [place])

## 2022-01-05 NOTE — Telephone Encounter (Signed)
Prescription sent in to get patient to her upcoming appointment

## 2022-01-13 ENCOUNTER — Other Ambulatory Visit: Payer: Self-pay | Admitting: Neurology

## 2022-01-14 ENCOUNTER — Other Ambulatory Visit: Payer: Self-pay | Admitting: Neurology

## 2022-01-25 ENCOUNTER — Encounter: Payer: Self-pay | Admitting: Neurology

## 2022-01-25 ENCOUNTER — Telehealth (INDEPENDENT_AMBULATORY_CARE_PROVIDER_SITE_OTHER): Payer: Medicare PPO | Admitting: Neurology

## 2022-01-25 ENCOUNTER — Telehealth: Payer: Self-pay | Admitting: Neurology

## 2022-01-25 VITALS — Ht 61.0 in | Wt 154.0 lb

## 2022-01-25 DIAGNOSIS — F02818 Dementia in other diseases classified elsewhere, unspecified severity, with other behavioral disturbance: Secondary | ICD-10-CM | POA: Diagnosis not present

## 2022-01-25 DIAGNOSIS — F03918 Unspecified dementia, unspecified severity, with other behavioral disturbance: Secondary | ICD-10-CM | POA: Diagnosis not present

## 2022-01-25 DIAGNOSIS — G301 Alzheimer's disease with late onset: Secondary | ICD-10-CM

## 2022-01-25 DIAGNOSIS — F0390 Unspecified dementia without behavioral disturbance: Secondary | ICD-10-CM

## 2022-01-25 MED ORDER — OLANZAPINE 10 MG PO TABS
ORAL_TABLET | ORAL | 3 refills | Status: DC
Start: 2022-01-25 — End: 2022-01-25

## 2022-01-25 MED ORDER — TRAZODONE HCL 50 MG PO TABS: ORAL_TABLET | ORAL | 3 refills | Status: DC

## 2022-01-25 MED ORDER — SERTRALINE HCL 50 MG PO TABS: 50.0000 mg | ORAL_TABLET | Freq: Every day | ORAL | 3 refills | Status: DC

## 2022-01-25 MED ORDER — MEMANTINE HCL 5 MG PO TABS
5.0000 mg | ORAL_TABLET | Freq: Every morning | ORAL | 3 refills | Status: DC
Start: 2022-01-25 — End: 2022-01-25

## 2022-01-25 MED ORDER — OLANZAPINE 10 MG PO TABS: ORAL_TABLET | ORAL | 3 refills | Status: DC

## 2022-01-25 MED ORDER — MEMANTINE HCL 5 MG PO TABS: 5.0000 mg | ORAL_TABLET | Freq: Every morning | ORAL | 3 refills | Status: DC

## 2022-01-25 MED ORDER — GABAPENTIN 100 MG PO CAPS: ORAL_CAPSULE | ORAL | 3 refills | Status: DC

## 2022-01-25 NOTE — Telephone Encounter (Signed)
Patients granddaughter called and stated [name] had an appt today.  The refills for her medication were sent to [facility], they needed to be sent to [facility].

## 2022-01-25 NOTE — Progress Notes (Signed)
Virtual Visit via Video Note The purpose of this virtual visit is to provide medical care while limiting exposure to the novel coronavirus.    Consent was obtained for video visit:  Yes.   Answered questions that patient had about telehealth interaction:  Yes.   I discussed the limitations, risks, security and privacy concerns of performing an evaluation and management service by telemedicine. I also discussed with the patient that there may be a patient responsible charge related to this service. The patient expressed understanding and agreed to proceed.  Pt location: Home Physician Location: office Name of referring provider:  [name], MD I connected with [name] at patients initiation/request on 01/25/2022 at 10:00 AM EDT by video enabled telemedicine application and verified that I am speaking with the correct person using two identifiers. Pt MRN:  [id number] Pt DOB:  01-19-1936 Video Participants:  [name];  [name] (granddaughter)   History of Present Illness:  The patient had a virtual video visit on 01/25/2022. She was last seen via video 6 months ago for dementia with behavioral disturbance. Her granddaughter [name] is present to provide history. Since her last visit, she was admitted at [facility] for altered mental status in 08/2021 and found to have bilateral PE and pneumonia. She is now on Eliquis and has recovered well. [name] reports she is doing really good. [name] manages medications, finances, meals. She can dress herself. [name] helps with bathing to help prevent falls and monitor for pressure sores. She has difficulty regulating the water and remembering steps in bathing. She is sleeping well. No paranoia or hallucinations, no wandering behaviors. She continues on Memantine 5mg  daily, Gabapentin 100mg  in AM, 200mg  in PM, olanzapine 10mg  1/2 tab in AM, 1 tab qhs, Sertraline 50mg  daily, and Trazodone 100mg  qhs without side effects. When she took Centrum and Flintstones  multivitamins, her sleep was messed up, she was not sleeping after 3 days of Flintstones MV. This improved after stopping MV. She still takes daily D3 and B12 supplements. No falls.   History on Initial Assessment 01/01/2020: This is a pleasant 86 year old right-handed woman with a history of hypertension, hyperlipidemia, prior stroke with no residual deficits, presenting for evaluation of auditory hallucinations and memory loss. She states her memory "comes and goes." She lives with her granddaughter and daughter-in-law . started noticing memory changes the last couple of years where she would be repeating herself. Recently, memory has worsened, she had 2 appointments in one day and did not remember the afternoon appointment. They have had to write down notes more. She manages her own medications and has not noticed any issues with forgetting medications. She used to live with her husband then he passed away and she lived alone in [place] for 9 months, before she moved in with Pierz 2 years ago. She was not missing any bill payments while alone, bills were switched to autopay 2 years ago. She continues to drive around [place] and denies getting lost. She was getting lost in [place], but they feel it was due to being in an unfamiliar place. [name] started noticing auditory hallucinations while she was still living alone, she would say something was not right with the air, or the doors, so [name] moved her. She was saying the she would hear cars at night or someone trying to break in, barricading her door. She would say some boys were drinking beside her condo or she had mice in the condo and put  traps out but never caught any. Since moving in with [name] 2 years ago, she continued to have hallucinations, mostly at night initially, saying something was in the bed with her. Hallucinations have progressively worsened the past few months, also occurring in the daytime. She does not  remember what she hears at night, [name] would remind her that she tells [name] she hears her granddaughter calling her "[name]" at night or during the day when the granddaughter is not home. She denies any visual hallucinations, however [name] reminds her the other day she thought she saw someone getting in the window, she heard things outside and said they were packing things into delivery trucks. She saw arms lifting things up, loading trucks up. She saw a shadow come back the other day on the 2nd level window. She would be afraid he would get a hold of the family.  She would be up all night, knocking on their doors telling them about the hallucinations. Melatonin did not help. Family brought her to the ER a week ago, CBC, CMP, urinalysis unremarkable except for anemia. I personally reviewed head CT without contrast which did not show any acute changes, there was diffuse cerebral and cerebellar atrophy, moderately extensive chronic microvascular disease. They were instructed to give Tylenol PM to help her sleep at night, she has been taking 2 tabs qhs and has been sleeping all night with this since then. No REM behavior disorder noted. [name] denies any prior psychiatric history. No hygiene concerns, she is able to bathe and dress independently. Her mother and father had Alzheimer's disease. No history of significant head injuries. She has not had any alcohol in a while.   She denies any headaches, dizziness, diplopia, dysarthria/dysphagia, neck/back pain, focal numbness/tingling/weakness, bowel/bladder dysfunction, anosmia, or tremors. Mood comes and goes, "but not bad." Her last fall was in October 2020 when she tripped. She is scheduled for left hip replacement on 01/28/20.   Update 02/04/2020: Since her last visit, she was brought to the ER on 01/04/20 for worsening behaviors with auditory and visual hallucinations about babies crying while accusing family of killing the crying babies. She would run out of the  house at all times at night. The police was in their home when she drove off without her phone. An IVC had to be issued so police could get her. She was brought to the ER where bloodwork was unremarkable. UA showed moderate leukocytes, 6-10 WBC, culture negative. UDS negative. She had an MRI brain on 01/05/20 which did not show any acute changes. There was diffuse cerebral atrophy, moderate chronic microvascular disease. She was admitted to [facility] health from 7/20 to 8/4. She was started on gabapentin 150mg  every 8 hours (250mg /80mL taking 3 mLs TID), Memantine 5mg  daily, olanzapine 10mg  qhs, Sertraline 50mg  daily, and Trazodone 50mg  qhs. Donepezil was stopped since she had significant worsening the day she started it. She has had significant improvement since hospital discharge. No further hallucinations or delusions.   Current Outpatient Medications on File Prior to Visit  Medication Sig Dispense Refill   apixaban (ELIQUIS) 5 MG TABS tablet Please take 2 tablets (10 mg) by mouth twice daily, until 3/18, then start taking 1 tablet (5 mg) by mouth twice daily on 3/19. 70 tablet 0   Cholecalciferol (VITAMIN D3) 25 MCG (1000 UT) CAPS Take 1,000 Units by mouth daily.     gabapentin (NEURONTIN) 100 MG capsule Take 1 cap in AM, 2 caps in PM 90 capsule 0   KAPSPARGO  SPRINKLE 25 MG CS24 Take 25 mg by mouth daily.     levothyroxine (SYNTHROID) 50 MCG tablet Take 75 mcg by mouth daily before breakfast.     lisinopril (ZESTRIL) 5 MG tablet Take 5 mg by mouth in the morning.     memantine (NAMENDA) 5 MG tablet TAKE 1 TABLET (5 MG TOTAL) BY MOUTH IN THE MORNING. 30 tablet 0   sertraline (ZOLOFT) 50 MG tablet Take 1 tablet (50 mg total) by mouth daily. 90 tablet 3   simvastatin (ZOCOR) 20 MG tablet Take 20 mg by mouth at bedtime.      traZODone (DESYREL) 50 MG tablet TAKE 2 TABLETS AT BEDTIME (Patient taking differently: Take 100 mg by mouth at bedtime.) 180 tablet 3   vitamin B-12 (CYANOCOBALAMIN)  1000 MCG tablet Take 1,000 mcg by mouth daily.     acetaminophen (TYLENOL) 500 MG tablet Take 1 tablet (500 mg total) by mouth every 4 (four) hours as needed for mild pain or fever. (Patient not taking: Reported on 01/25/2022) 30 tablet 0   azithromycin (ZITHROMAX) 500 MG tablet Take 1 tablet (500 mg total) by mouth daily. (Patient not taking: Reported on 01/25/2022) 1 tablet 0   cefdinir (OMNICEF) 300 MG capsule Take 1 capsule (300 mg total) by mouth 2 (two) times daily. (Patient not taking: Reported on 01/25/2022) 2 capsule 0   metoprolol succinate (TOPROL-XL) 50 MG 24 hr tablet Take 25 mg by mouth in the morning. (Patient not taking: Reported on 08/28/2021)     OLANZapine (ZYPREXA) 10 MG tablet TAKE 1/2 TABLET IN THE MORNING AND TAKE 1 TABLET AT BEDTIME (Patient taking differently: Take 5-10 mg by mouth See admin instructions. Take 5 mg ( 1/2 tab) in the AM and 1 tablet ( 10mg ) at bedtime) 135 tablet 1   tiZANidine (ZANAFLEX) 2 MG tablet Take 1 tablet (2 mg total) by mouth every 6 (six) hours as needed. (Patient not taking: Reported on 08/28/2021) 60 tablet 0   triamcinolone cream (KENALOG) 0.1 % Apply 1 application. topically daily as needed for rash. (Patient not taking: Reported on 01/25/2022)     No current facility-administered medications on file prior to visit.     Observations/Objective:   Vitals:   01/25/22 0833  Weight: 154 lb (69.9 kg)  Height: 5\' 1"  (1.549 m)   GEN:  The patient appears stated age and is in NAD.  Neurological examination: Patient is awake, alert, knows her birthday but cannot remember birth year ("[birth year]"). Month is January, does not know year. No aphasia or dysarthria. Intact fluency and comprehension. Remote and recent memory impaired. Unable to name pen ("gauge") or cup (perseverates on gauge). Cranial nerves: Extraocular movements intact with no nystagmus. No facial asymmetry. Motor: moves all extremities symmetrically, at least anti-gravity x 4. No incoordination  on finger to nose testing. Gait: slow and cautious with reduced arm swing, no tremor   Assessment and Plan:   This is a pleasant 86 yo RH woman with a history of hypertension, hyperlipidemia, prior stroke with no residual deficits, with likely Alzheimer's disease with behavioral changes. MRI brain no acute changes, there was diffuse cerebral atrophy, moderate chronic microvascular disease. She is overall stable, psychosis controlled on current regimen. Refills sent for olanzapine 10mg  1/2 tab in AM, 1 tab qhs, sertraline 50mg  daily, Trazodone 100mg  qhs, Memantine 5mg  daily, gabapentin 100mg  in AM, 200mg  qhs. Continue 24/7 care. Follow-up in 6 months, call for any changes.    Follow Up Instructions:   -I discussed  the assessment and treatment plan with the patient. The patient was provided an opportunity to ask questions and all were answered. The patient agreed with the plan and demonstrated an understanding of the instructions.   The patient was advised to call back or seek an in-person evaluation if the symptoms worsen or if the condition fails to improve as anticipated.     Van Clines, MD

## 2022-01-25 NOTE — Patient Instructions (Signed)
Good to see you. Continue all your medications. Follow-up in our office in 6 months, call for any changes.   FALL PRECAUTIONS: Be cautious when walking. Scan the area for obstacles that may increase the risk of trips and falls. When getting up in the mornings, sit up at the edge of the bed for a few minutes before getting out of bed. Consider elevating the bed at the head end to avoid drop of blood pressure when getting up. Walk always in a well-lit room (use night lights in the walls). Avoid area rugs or power cords from appliances in the middle of the walkways. Use a walker or a cane if necessary and consider physical therapy for balance exercise. Get your eyesight checked regularly.  HOME SAFETY: Consider the safety of the kitchen when operating appliances like stoves, microwave oven, and blender. Consider having supervision and share cooking responsibilities until no longer able to participate in those. Accidents with firearms and other hazards in the house should be identified and addressed as well.   ABILITY TO BE LEFT ALONE: If patient is unable to contact 911 operator, consider using LifeLine, or when the need is there, arrange for someone to stay with patients. Smoking is a fire hazard, consider supervision or cessation. Risk of wandering should be assessed by caregiver and if detected at any point, supervision and safe proof recommendations should be instituted.   RECOMMENDATIONS FOR ALL PATIENTS WITH MEMORY PROBLEMS: 1. Continue to exercise (Recommend 30 minutes of walking everyday, or 3 hours every week) 2. Increase social interactions - continue going to [place] and enjoy social gatherings with friends and family 3. Eat healthy, avoid fried foods and eat more fruits and vegetables 4. Maintain adequate blood pressure, blood sugar, and blood cholesterol level. Reducing the risk of stroke and cardiovascular disease also helps promoting better memory. 5. Avoid stressful situations. Live a  simple life and avoid aggravations. Organize your time and prepare for the next day in anticipation. 6. Sleep well, avoid any interruptions of sleep and avoid any distractions in the bedroom that may interfere with adequate sleep quality 7. Avoid sugar, avoid sweets as there is a strong link between excessive sugar intake, diabetes, and cognitive impairment The Mediterranean diet has been shown to help patients reduce the risk of progressive memory disorders and reduces cardiovascular risk. This includes eating fish, eat fruits and green leafy vegetables, nuts like almonds and hazelnuts, walnuts, and also use olive oil. Avoid fast foods and fried foods as much as possible. Avoid sweets and sugar as sugar use has been linked to worsening of memory function.  There is always a concern of gradual progression of memory problems. If this is the case, then we may need to adjust level of care according to patient needs. Support, both to the patient and caregiver, should then be put into place.

## 2022-01-25 NOTE — Addendum Note (Signed)
Addended by: Lenise Herald on: 01/25/2022 02:50 PM   Modules accepted: Orders

## 2022-01-25 NOTE — Telephone Encounter (Signed)
Luster Landsberg is working on this.

## 2022-03-17 DIAGNOSIS — E039 Hypothyroidism, unspecified: Secondary | ICD-10-CM | POA: Diagnosis not present

## 2022-04-14 DIAGNOSIS — M1612 Unilateral primary osteoarthritis, left hip: Secondary | ICD-10-CM | POA: Diagnosis not present

## 2022-04-14 DIAGNOSIS — E039 Hypothyroidism, unspecified: Secondary | ICD-10-CM | POA: Diagnosis not present

## 2022-04-14 DIAGNOSIS — F320 Major depressive disorder, single episode, mild: Secondary | ICD-10-CM | POA: Diagnosis not present

## 2022-04-14 DIAGNOSIS — F0390 Unspecified dementia without behavioral disturbance: Secondary | ICD-10-CM | POA: Diagnosis not present

## 2022-04-14 DIAGNOSIS — I10 Essential (primary) hypertension: Secondary | ICD-10-CM | POA: Diagnosis not present

## 2022-05-03 IMAGING — DX DG CHEST 1V PORT
1 series · 1 of 1 positions shown · non-contrast
Comparison: Chest x-ray 10/16/2020

CLINICAL DATA: Cough.

EXAM:
PORTABLE CHEST 1 VIEW

[chest ap]
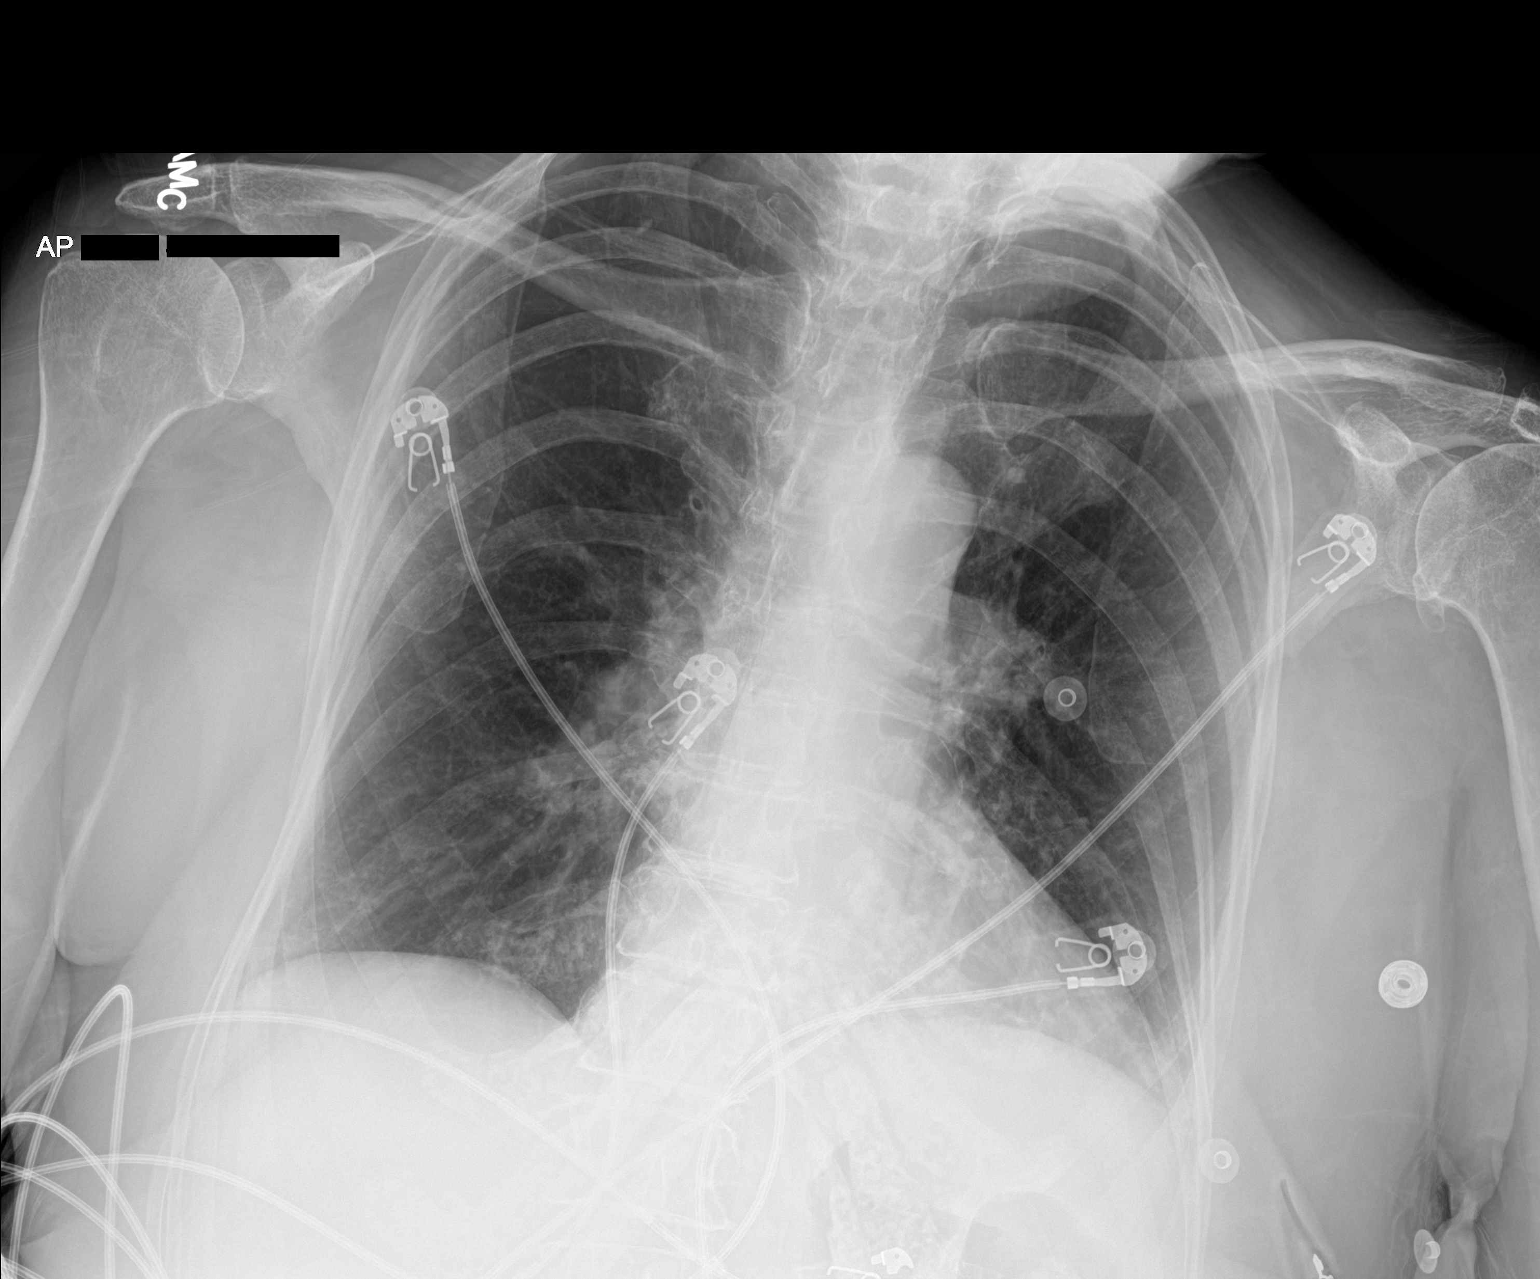

[1 of 1 positions shown; findings below may reference images not displayed]

FINDINGS: The heart size and mediastinal contours are within normal limits.
Both lungs are clear. The visualized skeletal structures are
unremarkable.
IMPRESSION: No active disease.

## 2022-05-04 IMAGING — CT CT ANGIO CHEST
2 of 6 series · 18 of 36 positions shown · IV contrast (agent unspecified)
Comparison: Chest x-ray from the previous day.

CLINICAL DATA: Cough and altered mental status

EXAM:
CT ANGIOGRAPHY CHEST WITH CONTRAST
TECHNIQUE: Multidetector CT imaging of the chest was performed using the
standard protocol during bolus administration of intravenous
contrast. Multiplanar CT image reconstructions and MIPs were
obtained to evaluate the vascular anatomy.

[Series 7: pe thins · axial · 0.74mm/px · z∈[+1026,+1301]mm · 17 of 437 slices shown]
[im 22/437  lung]
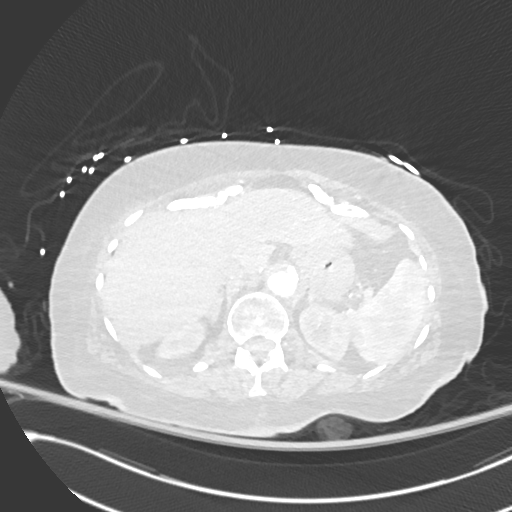
[im 44/437  mediastinal]
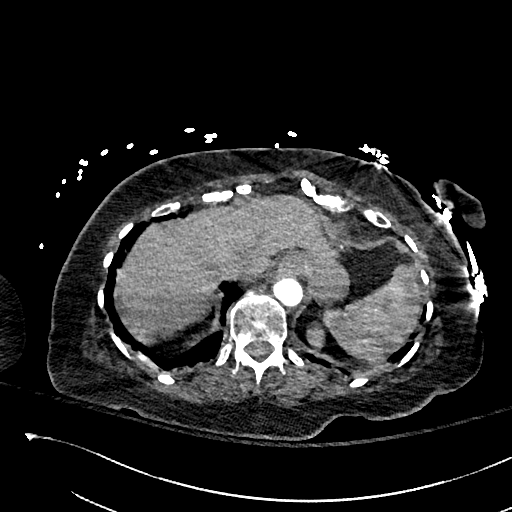
[im 66/437  lung]
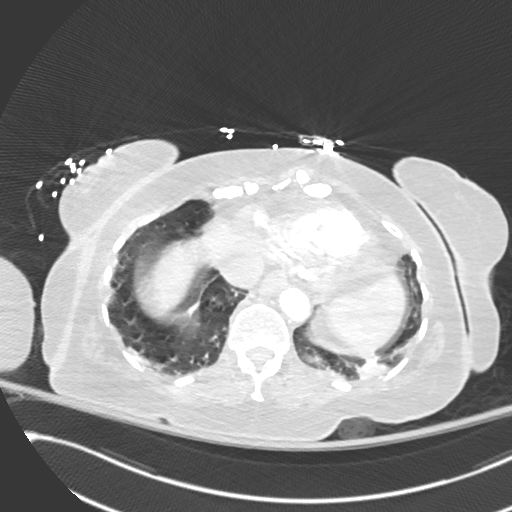
[im 88/437  mediastinal]
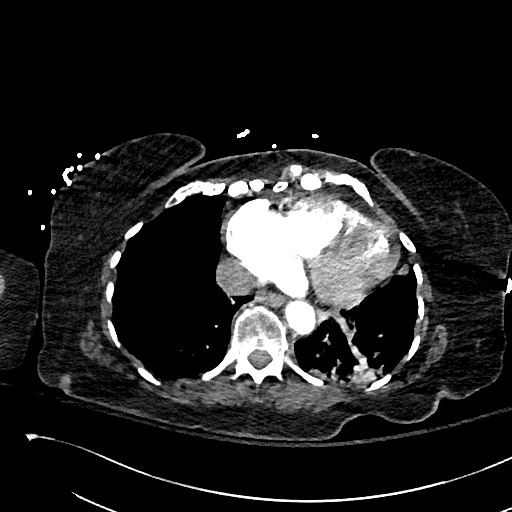
[im 131/437  lung]
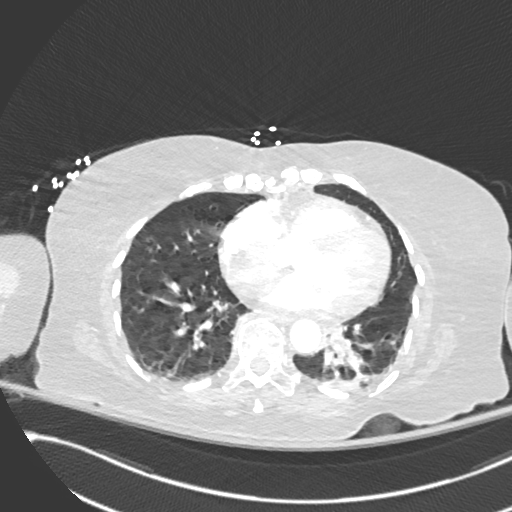
[im 153/437  mediastinal]
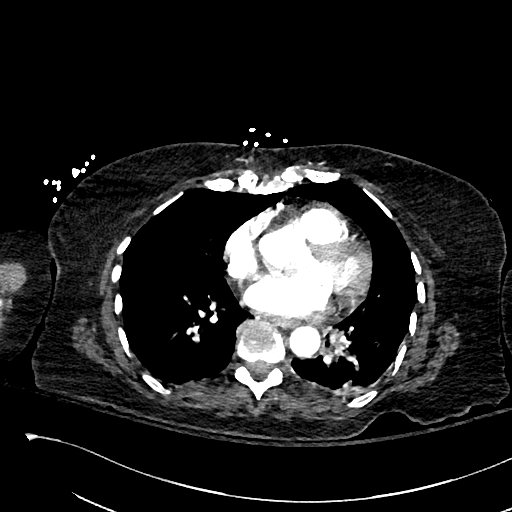
[im 175/437  lung]
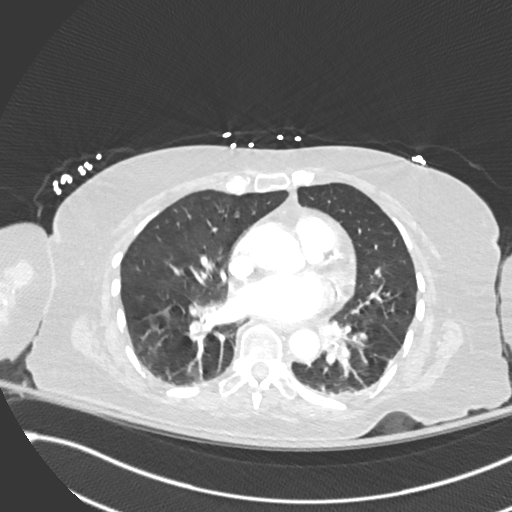
[im 197/437  mediastinal]
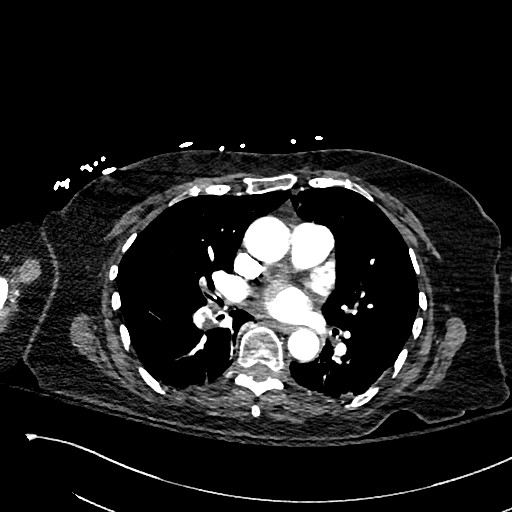
[im 219/437  lung]
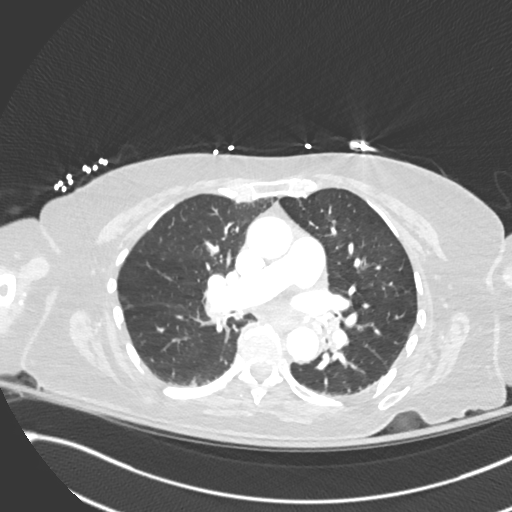
[im 240/437  mediastinal]
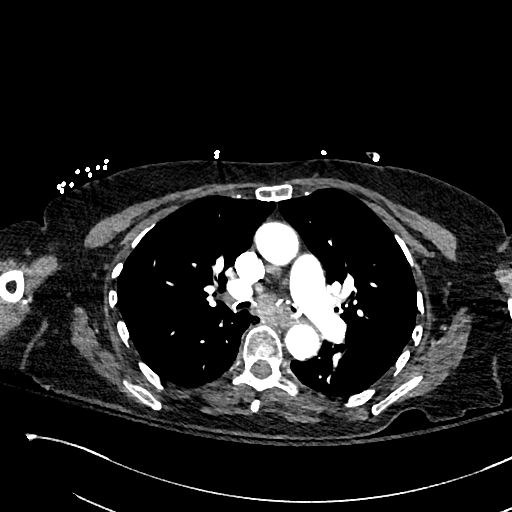
[im 262/437  lung]
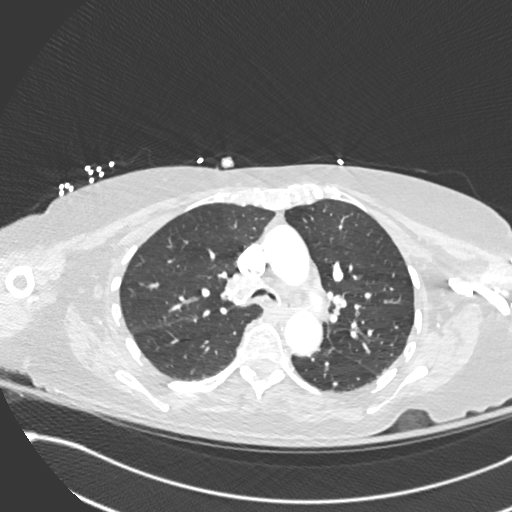
[im 284/437  mediastinal]
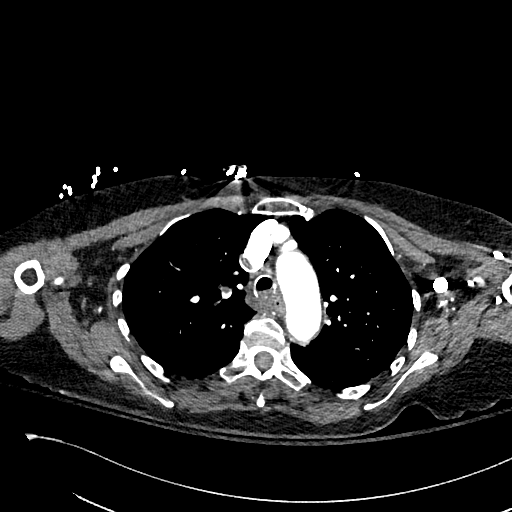
[im 306/437  lung]
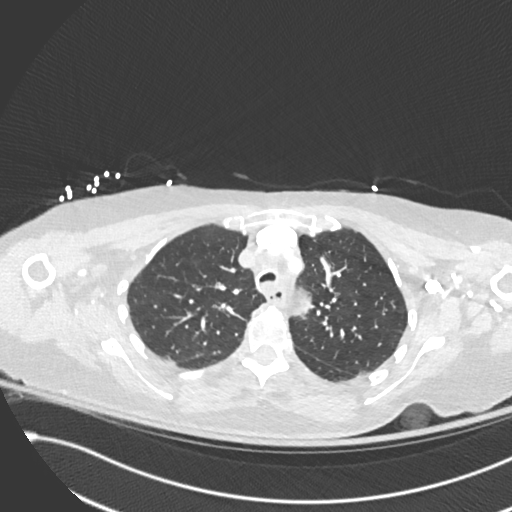
[im 349/437  mediastinal]
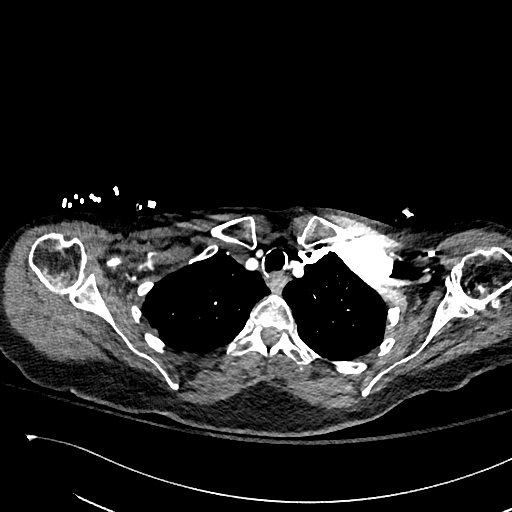
[im 371/437  lung]
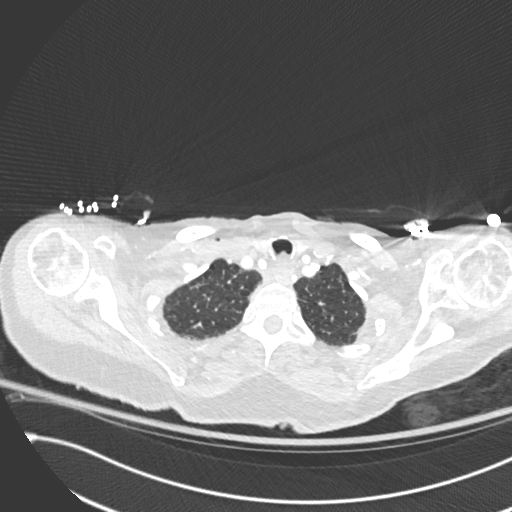
[im 393/437  mediastinal]
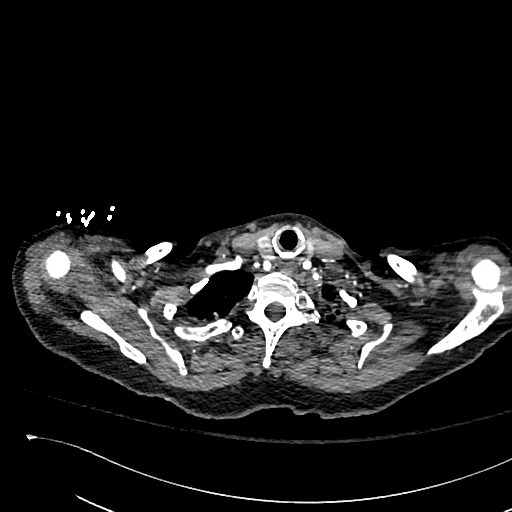
[im 415/437  lung]
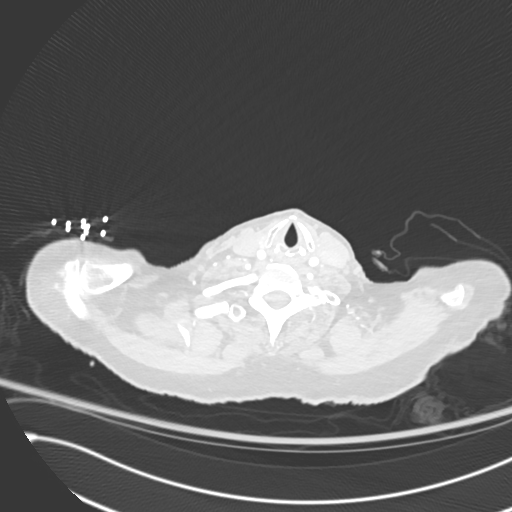

[Series 8: pe 2mm cor · coronal · 0.60mm/px · 1 of 122 slices shown]
[im 61/122  mediastinal]
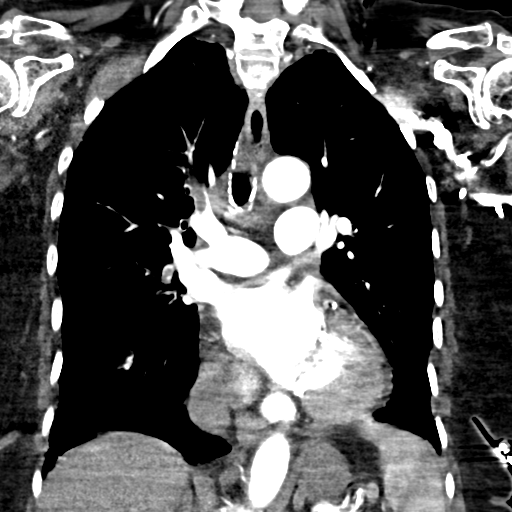

[18 of 36 positions shown; findings below may reference images not displayed]

RADIATION DOSE REDUCTION: This exam was performed according to the
departmental dose-optimization program which includes automated
exposure control, adjustment of the mA and/or kV according to
patient size and/or use of iterative reconstruction technique.

CONTRAST:  75mL OMNIPAQUE IOHEXOL 350 MG/ML SOLN
FINDINGS: Cardiovascular: Thoracic aorta shows a normal branching pattern.
Atherosclerotic calcifications are noted without aneurysmal
dilatation or dissection. Heart is mildly enlarged in size. Coronary
calcifications are noted. The pulmonary artery shows filling defects
throughout both pulmonary arteries but primarily within the lower
lobes bilaterally. This is consistent with extensive pulmonary
emboli. No evidence of right heart strain is seen.

Mediastinum/Nodes: Thoracic inlet is within normal limits. Esophagus
is within normal limits. No sizable hilar or mediastinal adenopathy
is noted.

Lungs/Pleura: Mucous is noted throughout the trachea and bronchial
tree bilaterally. Lungs are well aerated bilaterally. Left lower
lobe consolidation is noted. No sizable parenchymal nodules seen.

Upper Abdomen: Visualized upper abdomen is within normal limits.

Musculoskeletal: Degenerative changes of the thoracic spine are
noted. No acute rib abnormality is seen.

Review of the MIP images confirms the above findings.
IMPRESSION: Bilateral pulmonary emboli without evidence of right heart strain.

Mild left lower lobe consolidation. No pulmonary emboli this may
represent early changes of pulmonary infarct.

Cardiomegaly.

Aortic Atherosclerosis (NASZW-1IE.E).

Critical Value/emergent results were called by telephone at the time
of interpretation on 08/28/2021 at [DATE] to Dr. Blain, who
verbally acknowledged these results.

## 2022-05-14 IMAGING — CR DG CHEST 2V
2 series · 2 of 2 positions shown · non-contrast
Comparison: Chest x-ray 08/27/2021.

CLINICAL DATA: 86-year-old female with history of pneumonia.

EXAM:
CHEST - 2 VIEW

[w chest pa]
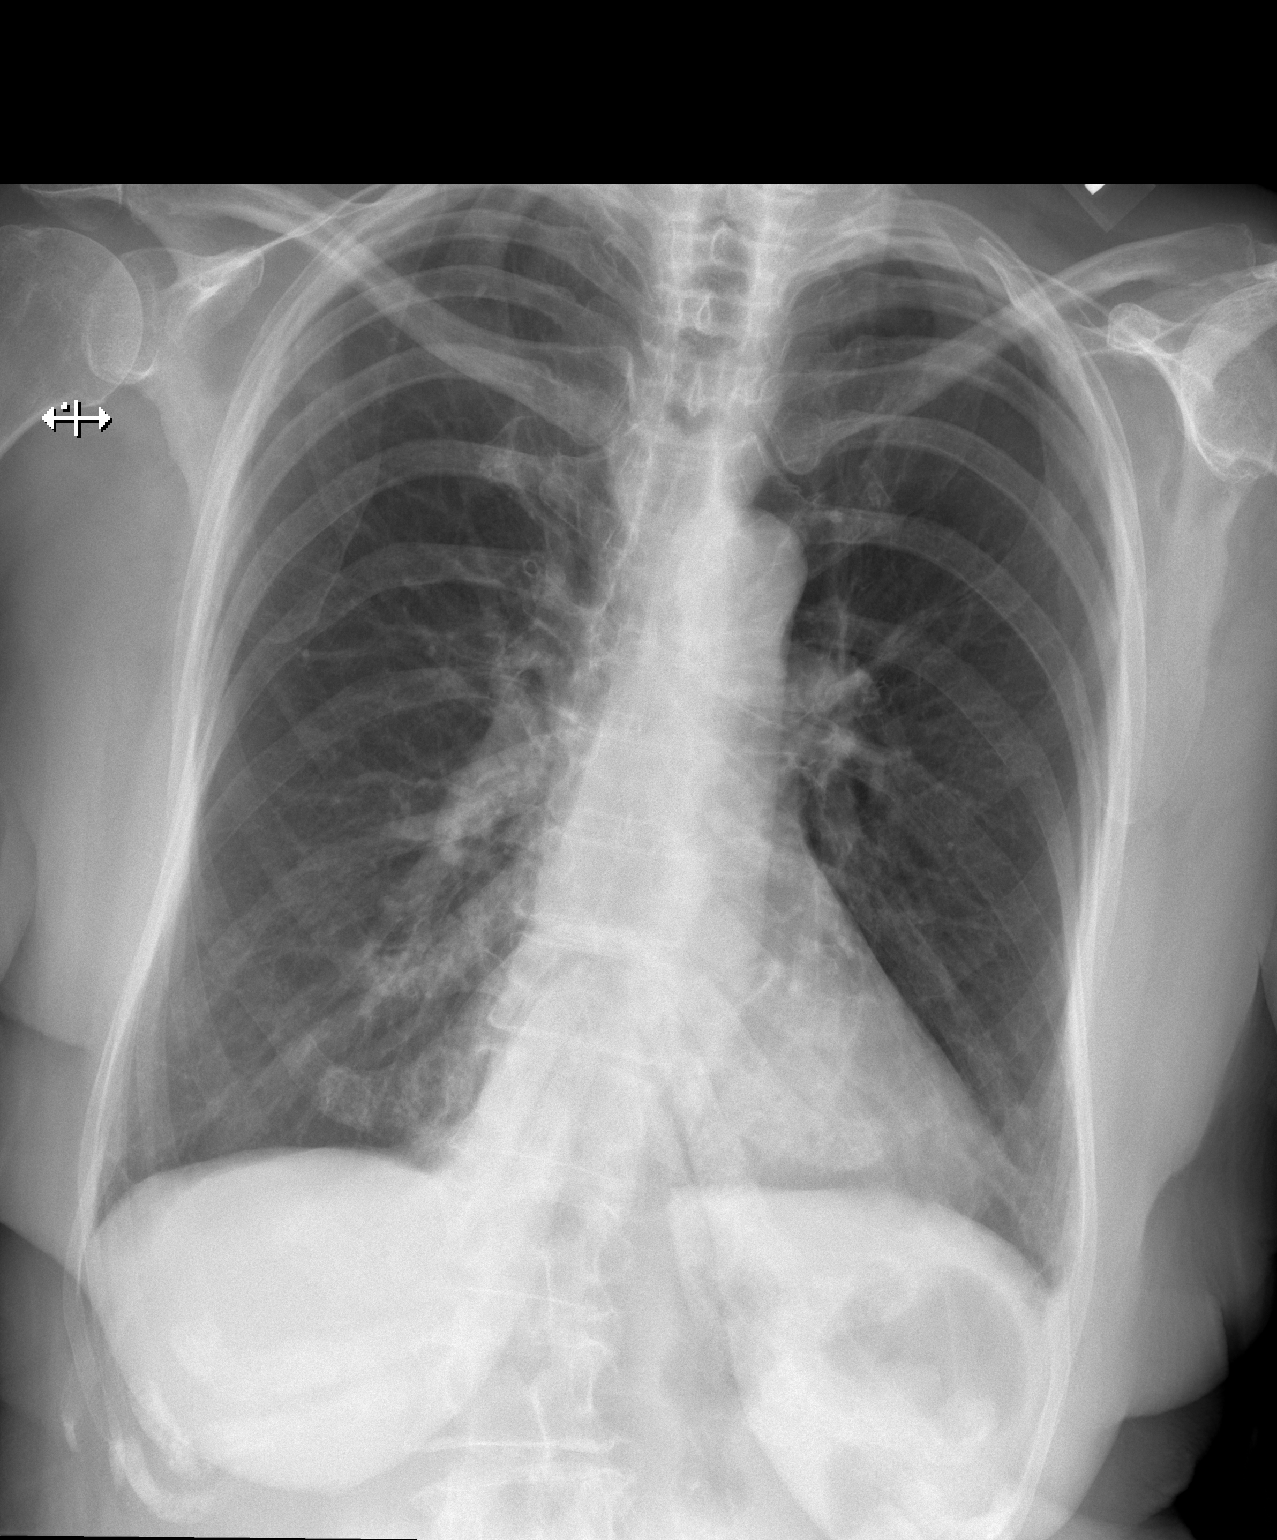

[w chest lat]
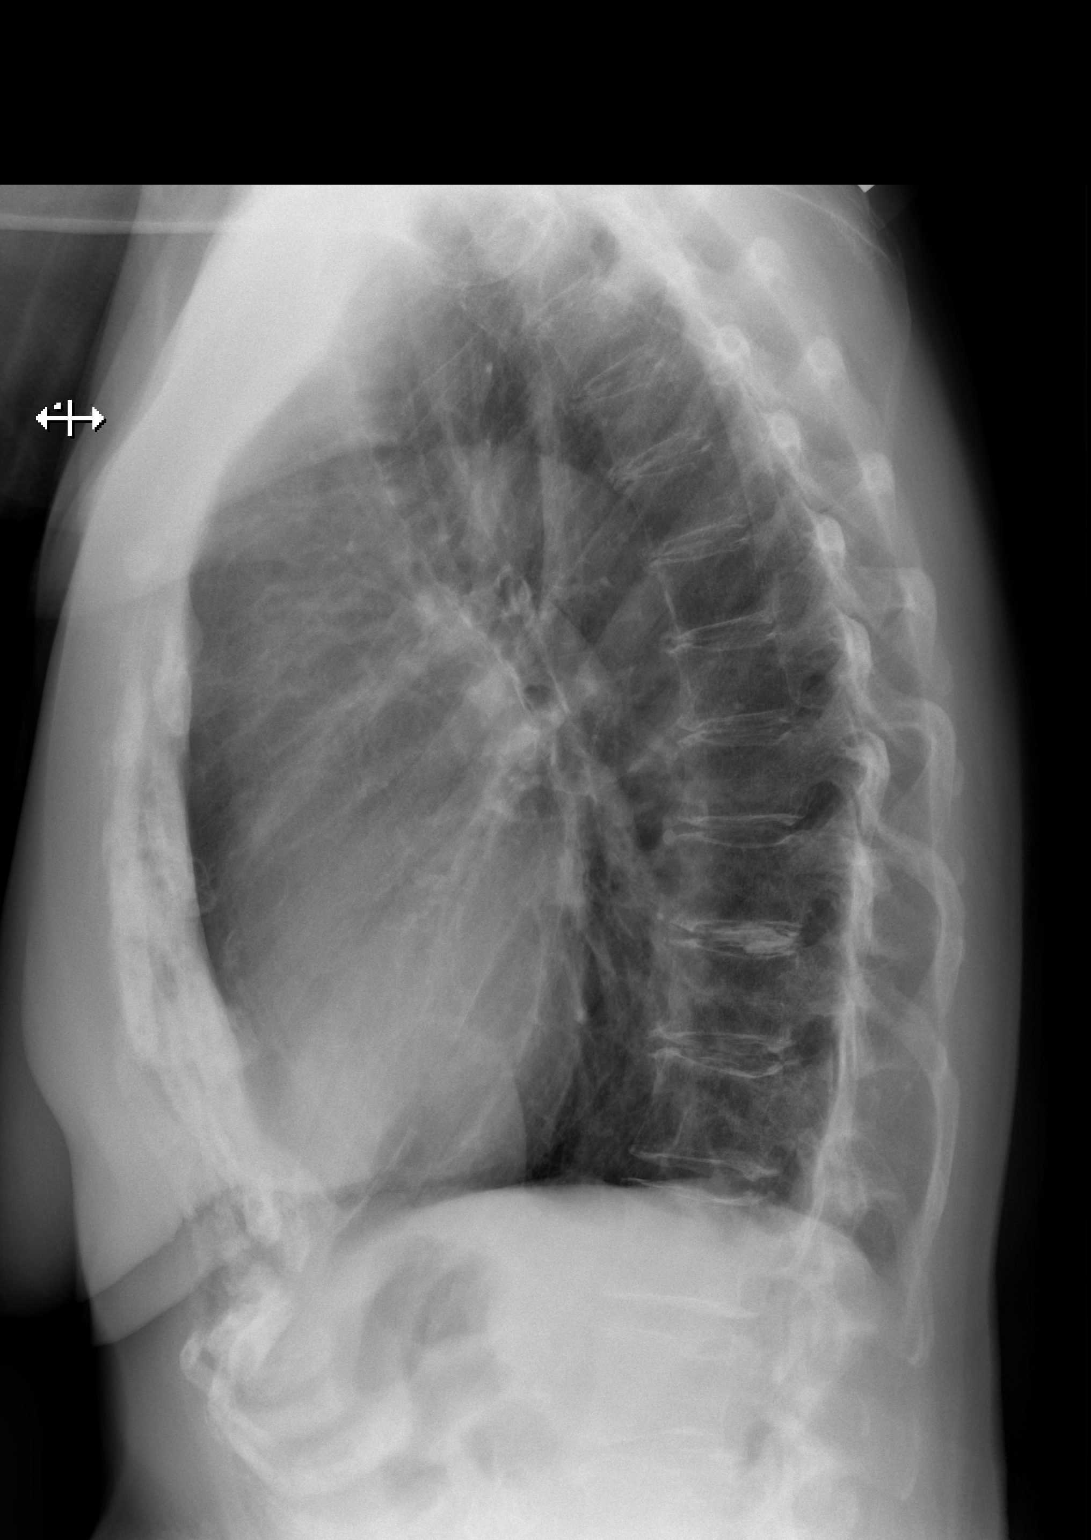

[2 of 2 positions shown; findings below may reference images not displayed]

FINDINGS: Lung volumes are normal. No consolidative airspace disease. No
pleural effusions. No pneumothorax. No pulmonary nodule or mass
noted. Pulmonary vasculature and the cardiomediastinal silhouette
are within normal limits.
IMPRESSION: No radiographic evidence of acute cardiopulmonary disease.

## 2022-06-02 DIAGNOSIS — R7303 Prediabetes: Secondary | ICD-10-CM | POA: Diagnosis not present

## 2022-06-02 DIAGNOSIS — E039 Hypothyroidism, unspecified: Secondary | ICD-10-CM | POA: Diagnosis not present

## 2022-06-02 DIAGNOSIS — I2699 Other pulmonary embolism without acute cor pulmonale: Secondary | ICD-10-CM | POA: Diagnosis not present

## 2022-06-02 DIAGNOSIS — R739 Hyperglycemia, unspecified: Secondary | ICD-10-CM | POA: Diagnosis not present

## 2022-06-02 DIAGNOSIS — E538 Deficiency of other specified B group vitamins: Secondary | ICD-10-CM | POA: Diagnosis not present

## 2022-06-02 DIAGNOSIS — E782 Mixed hyperlipidemia: Secondary | ICD-10-CM | POA: Diagnosis not present

## 2022-06-02 DIAGNOSIS — I82401 Acute embolism and thrombosis of unspecified deep veins of right lower extremity: Secondary | ICD-10-CM | POA: Diagnosis not present

## 2022-06-02 DIAGNOSIS — I10 Essential (primary) hypertension: Secondary | ICD-10-CM | POA: Diagnosis not present

## 2022-06-02 DIAGNOSIS — E559 Vitamin D deficiency, unspecified: Secondary | ICD-10-CM | POA: Diagnosis not present

## 2022-06-02 DIAGNOSIS — Z0000 Encounter for general adult medical examination without abnormal findings: Secondary | ICD-10-CM | POA: Diagnosis not present

## 2022-06-02 DIAGNOSIS — D649 Anemia, unspecified: Secondary | ICD-10-CM | POA: Diagnosis not present

## 2022-07-23 ENCOUNTER — Encounter: Payer: Medicare PPO | Admitting: Nurse Practitioner

## 2022-08-06 ENCOUNTER — Ambulatory Visit: Payer: Medicare PPO | Admitting: Neurology

## 2022-08-13 ENCOUNTER — Inpatient Hospital Stay: Payer: Medicare PPO | Admitting: Nurse Practitioner

## 2022-08-13 NOTE — Progress Notes (Deleted)
New Hematology/Oncology Consult   Requesting MD: Windy Carina, NP  [phone]      Reason for Consult: DVT  HPI:      Past Medical History:  Diagnosis Date   Anxiety    Arthritis    Depression    Hypertension    Pre-diabetes    Stroke ([place])    6 years ago   Thyroid disease   :   Past Surgical History:  Procedure Laterality Date   ABDOMINAL HYSTERECTOMY     CHOLECYSTECTOMY     MULTIPLE TOOTH EXTRACTIONS     ROTATOR CUFF REPAIR Left    TOTAL HIP ARTHROPLASTY Left 10/22/2020   Procedure: LEFT TOTAL HIP ARTHROPLASTY ANTERIOR APPROACH;  Surgeon: Frederik Pear, MD;  Location: [facility];  Service: Orthopedics;  Laterality: Left;  :   Current Outpatient Medications:    apixaban (ELIQUIS) 5 MG TABS tablet, Please take 2 tablets (10 mg) by mouth twice daily, until 3/18, then start taking 1 tablet (5 mg) by mouth twice daily on 3/19., Disp: 70 tablet, Rfl: 0   Cholecalciferol (VITAMIN D3) 25 MCG (1000 UT) CAPS, Take 1,000 Units by mouth daily., Disp: , Rfl:    gabapentin (NEURONTIN) 100 MG capsule, Take 1 cap in AM, 2 caps in PM, Disp: 270 capsule, Rfl: 3   KAPSPARGO SPRINKLE 25 MG CS24, Take 25 mg by mouth daily., Disp: , Rfl:    levothyroxine (SYNTHROID) 50 MCG tablet, Take 75 mcg by mouth daily before breakfast., Disp: , Rfl:    lisinopril (ZESTRIL) 5 MG tablet, Take 5 mg by mouth in the morning., Disp: , Rfl:    memantine (NAMENDA) 5 MG tablet, Take 1 tablet (5 mg total) by mouth in the morning., Disp: 90 tablet, Rfl: 3   OLANZapine (ZYPREXA) 10 MG tablet, TAKE 1/2 TABLET IN THE MORNING AND TAKE 1 TABLET AT BEDTIME, Disp: 135 tablet, Rfl: 3   sertraline (ZOLOFT) 50 MG tablet, Take 1 tablet (50 mg total) by mouth daily., Disp: 90 tablet, Rfl: 3   simvastatin (ZOCOR) 20 MG tablet, Take 20 mg by mouth at bedtime. , Disp: , Rfl:    traZODone (DESYREL) 50 MG tablet, TAKE 2 TABLETS AT BEDTIME, Disp: 180 tablet, Rfl: 3   vitamin B-12 (CYANOCOBALAMIN) 1000 MCG tablet, Take 1,000 mcg  by mouth daily., Disp: , Rfl: :  :   Allergies  Allergen Reactions   Donepezil Other (See Comments)    POSSIBLE (??) Hallucinations and sleep disturbances   Escitalopram Oxalate Other (See Comments)    UNK reaction   Hydrochlorothiazide Other (See Comments)    UNK reaction  :  FH:  SOCIAL HISTORY:  Review of Systems:  Positives include:  A complete ROS was otherwise negative.   Physical Exam:  There were no vitals taken for this visit.  HEENT: *** Lungs: *** Cardiac: *** Abdomen: *** GU: ***  Vascular: *** Lymph nodes: *** Neurologic: *** Skin: *** Musculoskeletal: ***  LABS:  No results for input(s): "WBC", "HGB", "HCT", "PLT" in the last 72 hours.  No results for input(s): "NA", "K", "CL", "CO2", "GLUCOSE", "BUN", "CREATININE", "CALCIUM" in the last 72 hours.    RADIOLOGY:  No results found.  Assessment and Plan:   ***    Ned Card, NP 08/13/2022, 8:49 AM

## 2022-09-05 ENCOUNTER — Emergency Department (HOSPITAL_COMMUNITY): Payer: Medicare PPO

## 2022-09-05 ENCOUNTER — Encounter (HOSPITAL_COMMUNITY): Payer: Self-pay

## 2022-09-05 ENCOUNTER — Observation Stay (HOSPITAL_COMMUNITY)
Admission: EM | Admit: 2022-09-05 | Discharge: 2022-09-06 | Disposition: A | Discharge status: Home / Self Care | Payer: Medicare PPO | Attending: Internal Medicine | Admitting: Internal Medicine

## 2022-09-05 ENCOUNTER — Other Ambulatory Visit: Payer: Self-pay

## 2022-09-05 DIAGNOSIS — Z6829 Body mass index (BMI) 29.0-29.9, adult: Secondary | ICD-10-CM | POA: Insufficient documentation

## 2022-09-05 DIAGNOSIS — R531 Weakness: Secondary | ICD-10-CM | POA: Diagnosis not present

## 2022-09-05 DIAGNOSIS — Z1152 Encounter for screening for COVID-19: Secondary | ICD-10-CM | POA: Diagnosis not present

## 2022-09-05 DIAGNOSIS — Z79890 Hormone replacement therapy (postmenopausal): Secondary | ICD-10-CM | POA: Insufficient documentation

## 2022-09-05 DIAGNOSIS — G309 Alzheimer's disease, unspecified: Secondary | ICD-10-CM | POA: Insufficient documentation

## 2022-09-05 DIAGNOSIS — Z79899 Other long term (current) drug therapy: Secondary | ICD-10-CM | POA: Insufficient documentation

## 2022-09-05 DIAGNOSIS — R4701 Aphasia: Secondary | ICD-10-CM | POA: Diagnosis not present

## 2022-09-05 DIAGNOSIS — I10 Essential (primary) hypertension: Secondary | ICD-10-CM | POA: Insufficient documentation

## 2022-09-05 DIAGNOSIS — F03918 Unspecified dementia, unspecified severity, with other behavioral disturbance: Secondary | ICD-10-CM | POA: Diagnosis present

## 2022-09-05 DIAGNOSIS — R4781 Slurred speech: Secondary | ICD-10-CM | POA: Diagnosis not present

## 2022-09-05 DIAGNOSIS — Z8673 Personal history of transient ischemic attack (TIA), and cerebral infarction without residual deficits: Secondary | ICD-10-CM | POA: Insufficient documentation

## 2022-09-05 DIAGNOSIS — Z96642 Presence of left artificial hip joint: Secondary | ICD-10-CM | POA: Insufficient documentation

## 2022-09-05 DIAGNOSIS — J069 Acute upper respiratory infection, unspecified: Secondary | ICD-10-CM | POA: Diagnosis not present

## 2022-09-05 DIAGNOSIS — G9389 Other specified disorders of brain: Secondary | ICD-10-CM | POA: Diagnosis not present

## 2022-09-05 DIAGNOSIS — Z86718 Personal history of other venous thrombosis and embolism: Secondary | ICD-10-CM | POA: Diagnosis not present

## 2022-09-05 DIAGNOSIS — E669 Obesity, unspecified: Secondary | ICD-10-CM | POA: Diagnosis not present

## 2022-09-05 DIAGNOSIS — F02818 Dementia in other diseases classified elsewhere, unspecified severity, with other behavioral disturbance: Secondary | ICD-10-CM | POA: Insufficient documentation

## 2022-09-05 DIAGNOSIS — R29818 Other symptoms and signs involving the nervous system: Secondary | ICD-10-CM | POA: Diagnosis not present

## 2022-09-05 DIAGNOSIS — Z86711 Personal history of pulmonary embolism: Secondary | ICD-10-CM | POA: Insufficient documentation

## 2022-09-05 DIAGNOSIS — N179 Acute kidney failure, unspecified: Secondary | ICD-10-CM | POA: Diagnosis not present

## 2022-09-05 DIAGNOSIS — E039 Hypothyroidism, unspecified: Secondary | ICD-10-CM | POA: Insufficient documentation

## 2022-09-05 DIAGNOSIS — D72829 Elevated white blood cell count, unspecified: Secondary | ICD-10-CM | POA: Insufficient documentation

## 2022-09-05 DIAGNOSIS — I6523 Occlusion and stenosis of bilateral carotid arteries: Secondary | ICD-10-CM | POA: Diagnosis not present

## 2022-09-05 DIAGNOSIS — Z7901 Long term (current) use of anticoagulants: Secondary | ICD-10-CM | POA: Diagnosis not present

## 2022-09-05 DIAGNOSIS — R262 Difficulty in walking, not elsewhere classified: Secondary | ICD-10-CM | POA: Insufficient documentation

## 2022-09-05 LAB — CBC
HCT: 35.7 % — ABNORMAL LOW (ref 36.0–46.0)
Hemoglobin: 11.1 g/dL — ABNORMAL LOW (ref 12.0–15.0)
MCH: 26.9 pg (ref 26.0–34.0)
MCHC: 31.1 g/dL (ref 30.0–36.0)
MCV: 86.4 fL (ref 80.0–100.0)
Platelets: 239 10*3/uL (ref 150–400)
RBC: 4.13 MIL/uL (ref 3.87–5.11)
RDW: 14.1 % (ref 11.5–15.5)
WBC: 12.2 10*3/uL — ABNORMAL HIGH (ref 4.0–10.5)
nRBC: 0 % (ref 0.0–0.2)

## 2022-09-05 LAB — I-STAT CHEM 8, ED
BUN: 32 mg/dL — ABNORMAL HIGH (ref 8–23)
Calcium, Ion: 1.15 mmol/L (ref 1.15–1.40)
Chloride: 104 mmol/L (ref 98–111)
Creatinine, Ser: 1.3 mg/dL — ABNORMAL HIGH (ref 0.44–1.00)
Glucose, Bld: 131 mg/dL — ABNORMAL HIGH (ref 70–99)
HCT: 35 % — ABNORMAL LOW (ref 36.0–46.0)
Hemoglobin: 11.9 g/dL — ABNORMAL LOW (ref 12.0–15.0)
Potassium: 4.9 mmol/L (ref 3.5–5.1)
Sodium: 138 mmol/L (ref 135–145)
TCO2: 25 mmol/L (ref 22–32)

## 2022-09-05 LAB — DIFFERENTIAL
Abs Immature Granulocytes: 0.04 10*3/uL (ref 0.00–0.07)
Basophils Absolute: 0 10*3/uL (ref 0.0–0.1)
Basophils Relative: 0 %
Eosinophils Absolute: 0 10*3/uL (ref 0.0–0.5)
Eosinophils Relative: 0 %
Immature Granulocytes: 0 %
Lymphocytes Relative: 4 %
Lymphs Abs: 0.4 10*3/uL — ABNORMAL LOW (ref 0.7–4.0)
Monocytes Absolute: 0.6 10*3/uL (ref 0.1–1.0)
Monocytes Relative: 5 %
Neutro Abs: 11.1 10*3/uL — ABNORMAL HIGH (ref 1.7–7.7)
Neutrophils Relative %: 91 %

## 2022-09-05 LAB — COMPREHENSIVE METABOLIC PANEL
ALT: 15 U/L (ref 0–44)
AST: 17 U/L (ref 15–41)
Albumin: 3.6 g/dL (ref 3.5–5.0)
Alkaline Phosphatase: 55 U/L (ref 38–126)
Anion gap: 10 (ref 5–15)
BUN: 29 mg/dL — ABNORMAL HIGH (ref 8–23)
CO2: 22 mmol/L (ref 22–32)
Calcium: 8.9 mg/dL (ref 8.9–10.3)
Chloride: 104 mmol/L (ref 98–111)
Creatinine, Ser: 1.19 mg/dL — ABNORMAL HIGH (ref 0.44–1.00)
GFR, Estimated: 45 mL/min — ABNORMAL LOW (ref >60)
Glucose, Bld: 130 mg/dL — ABNORMAL HIGH (ref 70–99)
Potassium: 4.8 mmol/L (ref 3.5–5.1)
Sodium: 136 mmol/L (ref 135–145)
Total Bilirubin: 0.3 mg/dL (ref 0.3–1.2)
Total Protein: 6.4 g/dL — ABNORMAL LOW (ref 6.5–8.1)

## 2022-09-05 LAB — URINALYSIS, ROUTINE W REFLEX MICROSCOPIC
Bilirubin Urine: NEGATIVE
Glucose, UA: NEGATIVE mg/dL
Hgb urine dipstick: NEGATIVE
Ketones, ur: NEGATIVE mg/dL
Leukocytes,Ua: NEGATIVE
Nitrite: NEGATIVE
Protein, ur: NEGATIVE mg/dL
Specific Gravity, Urine: 1.029 (ref 1.005–1.030)
pH: 5 (ref 5.0–8.0)

## 2022-09-05 LAB — ETHANOL: Alcohol, Ethyl (B): <10 mg/dL (ref <10)

## 2022-09-05 LAB — PROTIME-INR
INR: 1.3 — ABNORMAL HIGH (ref 0.8–1.2)
Prothrombin Time: 16.2 seconds — ABNORMAL HIGH (ref 11.4–15.2)

## 2022-09-05 LAB — APTT: aPTT: 35 seconds (ref 24–36)

## 2022-09-05 LAB — CBG MONITORING, ED: Glucose-Capillary: 128 mg/dL — ABNORMAL HIGH (ref 70–99)

## 2022-09-05 MED ORDER — ONDANSETRON HCL 4 MG/2ML IJ SOLN: 4.0000 mg | Freq: Four times a day (QID) | INTRAMUSCULAR | Status: DC | PRN

## 2022-09-05 MED ORDER — IOPAMIDOL (ISOVUE-370) INJECTION 76%
75.0000 mL | Freq: Once | INTRAVENOUS | Status: AC | PRN
  Administered 2022-09-05: 75 mL via INTRAVENOUS

## 2022-09-05 MED ORDER — SIMVASTATIN 20 MG PO TABS
20.0000 mg | ORAL_TABLET | Freq: Every day | ORAL | Status: DC
  Administered 2022-09-05: 20 mg via ORAL
  Filled 2022-09-05: qty 1

## 2022-09-05 MED ORDER — ACETAMINOPHEN 325 MG PO TABS: 650.0000 mg | ORAL_TABLET | ORAL | Status: DC | PRN

## 2022-09-05 MED ORDER — ACETAMINOPHEN 160 MG/5ML PO SOLN: 650.0000 mg | ORAL | Status: DC | PRN

## 2022-09-05 MED ORDER — LEVOTHYROXINE SODIUM 75 MCG PO TABS
75.0000 ug | ORAL_TABLET | Freq: Every day | ORAL | Status: DC
  Administered 2022-09-06: 75 ug via ORAL
  Filled 2022-09-05: qty 1

## 2022-09-05 MED ORDER — APIXABAN 5 MG PO TABS
5.0000 mg | ORAL_TABLET | Freq: Two times a day (BID) | ORAL | Status: DC
  Administered 2022-09-05 – 2022-09-06 (×2): 5 mg via ORAL
  Filled 2022-09-05: qty 1

## 2022-09-05 MED ORDER — SODIUM CHLORIDE 0.9 % IV SOLN: INTRAVENOUS | Status: AC

## 2022-09-05 MED ORDER — GABAPENTIN 100 MG PO CAPS: 100.0000 mg | ORAL_CAPSULE | Freq: Two times a day (BID) | ORAL | Status: DC

## 2022-09-05 MED ORDER — OLANZAPINE 10 MG PO TABS: 5.0000 mg | ORAL_TABLET | Freq: Two times a day (BID) | ORAL | Status: DC

## 2022-09-05 MED ORDER — STROKE: EARLY STAGES OF RECOVERY BOOK
Freq: Once | Status: AC
  Filled 2022-09-05: qty 1

## 2022-09-05 MED ORDER — GABAPENTIN 100 MG PO CAPS
100.0000 mg | ORAL_CAPSULE | Freq: Every day | ORAL | Status: DC
  Administered 2022-09-06: 100 mg via ORAL
  Filled 2022-09-05: qty 1

## 2022-09-05 MED ORDER — GABAPENTIN 100 MG PO CAPS
200.0000 mg | ORAL_CAPSULE | Freq: Every day | ORAL | Status: DC
  Administered 2022-09-05: 200 mg via ORAL
  Filled 2022-09-05: qty 2

## 2022-09-05 MED ORDER — TRAZODONE HCL 50 MG PO TABS
100.0000 mg | ORAL_TABLET | Freq: Every day | ORAL | Status: DC
  Administered 2022-09-05: 100 mg via ORAL
  Filled 2022-09-05: qty 2

## 2022-09-05 MED ORDER — SENNOSIDES-DOCUSATE SODIUM 8.6-50 MG PO TABS: 1.0000 | ORAL_TABLET | Freq: Every evening | ORAL | Status: DC | PRN

## 2022-09-05 MED ORDER — APIXABAN 5 MG PO TABS
5.0000 mg | ORAL_TABLET | Freq: Two times a day (BID) | ORAL | Status: DC
  Filled 2022-09-05: qty 1

## 2022-09-05 MED ORDER — ACETAMINOPHEN 650 MG RE SUPP: 650.0000 mg | RECTAL | Status: DC | PRN

## 2022-09-05 MED ORDER — OLANZAPINE 5 MG PO TABS
10.0000 mg | ORAL_TABLET | Freq: Every day | ORAL | Status: DC
  Administered 2022-09-05: 10 mg via ORAL
  Filled 2022-09-05: qty 1

## 2022-09-05 MED ORDER — OLANZAPINE 5 MG PO TABS
5.0000 mg | ORAL_TABLET | Freq: Every day | ORAL | Status: DC
  Administered 2022-09-06: 5 mg via ORAL
  Filled 2022-09-05: qty 1

## 2022-09-05 MED ORDER — SERTRALINE HCL 50 MG PO TABS
50.0000 mg | ORAL_TABLET | Freq: Every day | ORAL | Status: DC
  Administered 2022-09-06: 50 mg via ORAL
  Filled 2022-09-05: qty 1

## 2022-09-05 MED ORDER — MEMANTINE HCL 10 MG PO TABS
5.0000 mg | ORAL_TABLET | Freq: Every morning | ORAL | Status: DC
  Administered 2022-09-06: 5 mg via ORAL
  Filled 2022-09-05: qty 1

## 2022-09-05 NOTE — Assessment & Plan Note (Signed)
Mildly elevated WBC 12.2.  No obvious infectious process.  UA negative for UTI.  CXR without evidence of pneumonia.  Rectal temp 99.6 F. -Check COVID, flu, RSV panel -Repeat CBC in a.m.

## 2022-09-05 NOTE — Assessment & Plan Note (Signed)
Presenting with expressive aphasia, slurred speech, leaning to her left.  Neurology following. -CTA head/neck negative for LVO or significant stenosis -MRI brain negative for acute abnormality -Keep on telemetry, continue neurochecks -PT/OT/SLP eval -Check A1c and lipid panel -Routine EEG per neurology -Follow ammonia, B12, folate, thiamine -Allow permissive hypertension for now -IV fluid hydration overnight

## 2022-09-05 NOTE — Code Documentation (Signed)
Stroke Response Nurse Documentation Code Documentation  [name] is a 87 y.o. female arriving to [facility]  via [facility] on 09-05-2022 with past medical hx of dementia, HTN, CVA. On Eliquis (apixaban) daily. Code stroke was activated by EMS.   Patient from home where she was LKW at 1300 and now complaining of difficulty speaking and leaning to her left.  Per her granddaughter she was her normal self sitting in her recliner after lunch.  Then about an hour later when she came back in to check on her she was speaking incoherently  Stroke team at the bedside on patient arrival. Labs drawn and patient cleared for CT by Dr. Vanita Panda. Patient to CT with team. NIHSS 6, see documentation for details and code stroke times. Patient with disoriented, left leg weakness, Expressive aphasia , and dysarthria  on exam. The following imaging was completed:  CT Head and CTA. Patient is not a candidate for IV Thrombolytic due to being on Eliquis. Patient is not a candidate for IR due to no LVO on CTA.   Care Plan: VS and NIHSS q 2 hours x 12 hours.   Bedside handoff with ED RN Alana.    Raliegh Ip  Stroke Response RN

## 2022-09-05 NOTE — Consult Note (Signed)
NEUROLOGY CONSULTATION NOTE   Date of service: September 05, 2022 Patient Name: [name] MRN:  [id number] DOB:  1935/08/26 Reason for consult: "confusion, aphasia, leaning left, code stroke" Requesting Provider: Carmin Muskrat, MD _ _ _   _ __   _ __ _ _  __ __   _ __   __ _  History of Present Illness  [name] is a 87 y.o. 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 She was brought in as a code stroke from home for aphasia, slurred speech, leaning to her left.  Per grand daughter, was fine and had lunch around 1230-1pm. Then sitting in recliner bent over and leaning to her left. Came back an hour later and was still bent. [place] daughter tried to converse and speech was not coherent. Felt a little warm to touch. Speech was not making sense so called EMS. Did something like this in the past in the setting of infection.  LKW: 1300 mRS: 3(walks independently, needs help with buttons when dressing/undressing, unable to do groceries/finances. Can fix herself a small sandwich, showers by herself, cleans after herself after using bathroom) tNKASE: not offered, she is on Eliquis and took it this AM. Thrombectomy: not offered, no LVO NIHSS components Score: Comment  1a Level of Conscious 0[x]  1[]  2[]  3[]      1b LOC Questions 0[]  1[]  2[x]       1c LOC Commands 0[x]  1[]  2[]       2 Best Gaze 0[x]  1[]  2[]       3 Visual 0[x]  1[]  2[]  3[]      4 Facial Palsy 0[x]  1[]  2[]  3[]      5a Motor Arm - left 0[x]  1[]  2[]  3[]  4[]  UN[]    5b Motor Arm - Right 0[x]  1[]  2[]  3[]  4[]  UN[]    6a Motor Leg - Left 0[]  1[x]  2[]  3[]  4[]  UN[]    6b Motor Leg - Right 0[x]  1[]  2[]  3[]  4[]  UN[]    7 Limb Ataxia 0[x]  1[]  2[]  3[]  UN[]     8 Sensory 0[x]  1[]  2[]  UN[]      9 Best Language 0[]  1[]  2[x]  3[]      10 Dysarthria 0[]  1[x]  2[]  UN[]      11 Extinct. and Inattention 0[x]  1[]  2[]       TOTAL: 6        ROS   Unable to get detailed ROS 2/2 aphasia.  Past History   Past Medical History:  Diagnosis Date   Anxiety    Arthritis    Depression    Hypertension    Pre-diabetes    Stroke ([place])    6 years ago   Thyroid disease    Past Surgical History:  Procedure Laterality Date   ABDOMINAL HYSTERECTOMY     CHOLECYSTECTOMY     MULTIPLE TOOTH EXTRACTIONS     ROTATOR CUFF REPAIR Left    TOTAL HIP ARTHROPLASTY Left 10/22/2020   Procedure: LEFT TOTAL HIP ARTHROPLASTY ANTERIOR APPROACH;  Surgeon: Frederik Pear, MD;  Location: [facility];  Service: Orthopedics;  Laterality: Left;   No family history on file. Social History   Socioeconomic History   Marital status: Widowed    Spouse name: Not on file   Number of children: Not on file   Years of education: Not on file   Highest education level: Not on file  Occupational History   Not on file  Tobacco Use  Smoking status: Never   Smokeless tobacco: Never  Vaping Use   Vaping Use: Never used  Substance and Sexual Activity   Alcohol use: Never   Drug use: Never   Sexual activity: Not on file    Comment: Hysterectomy  Other Topics Concern   Not on file  Social History Narrative   Right Handed   Two Story Home   Drinks Caffeine    Social Determinants of Health   Financial Resource Strain: Not on file  Food Insecurity: Not on file  Transportation Needs: Not on file  Physical Activity: Not on file  Stress: Not on file  Social Connections: Not on file   Allergies  Allergen Reactions   Donepezil Other (See Comments)    POSSIBLE (??) Hallucinations and sleep disturbances   Escitalopram Oxalate Other (See Comments)    UNK reaction   Hydrochlorothiazide Other (See Comments)    UNK reaction    Medications  (Not in a hospital admission)    Vitals   Vitals:   09/05/22 1842  Weight: 71.4 kg     Body mass index is 29.74 kg/m.  Physical Exam   General: Laying comfortably in bed; in no acute distress.  HENT: Normal  oropharynx and mucosa. Normal external appearance of ears and nose.  Neck: Supple, no pain or tenderness  CV: No JVD. No peripheral edema.  Pulmonary: Symmetric Chest rise. Normal respiratory effort.  Abdomen: Soft to touch, non-tender.  Ext: No cyanosis, edema, or deformity  Skin: No rash. Normal palpation of skin.   Musculoskeletal: Normal digits and nails by inspection. No clubbing.   Neurologic Examination  Mental status/Cognition: Alert, oriented to self only. Speech/language: dysarthric speech, non fluent, comprehension intact to simple commands, unable to name objects, gets stuck on several words. Speech is essentially word salad. Cranial nerves:   CN II Pupils equal and reactive to light, blinks to threat BL   CN III,IV,VI EOM intact to dolls eyes, no gaze preference or deviation, no nystagmus   CN V normal sensation in V1, V2, and V3 segments bilaterally   CN VII no asymmetry, no nasolabial fold flattening   CN VIII Makes eye contact to speech   CN IX & X normal palatal elevation, no uvular deviation   CN XI Head midline   CN XII midline tongue protrusion   Motor:  Muscle bulk: poor, tone normal, pronator drift none  Mvmt Root Nerve  Muscle Right Left Comments  SA C5/6 Ax Deltoid     EF C5/6 Mc Biceps 4 4   EE C6/7/8 Rad Triceps 4 4   WF C6/7 Med FCR     WE C7/8 PIN ECU     F Ab C8/T1 U ADM/FDI 4+ 4+   HF L1/2/3 Fem Illopsoas 4 3   KE L2/3/4 Fem Quad     DF L4/5 D Peron Tib Ant 4 4   PF S1/2 Tibial Grc/Sol 4 4    Sensation:  Light touch Regard touch in all extremities and localizes to mild pinch in all extremities.   Pin prick    Temperature    Vibration   Proprioception    Coordination/Complex Motor:  - Finger to Nose intact BL - Heel to shin unable to get her to do. - Rapid alternating movement: unable to do despite attempts. - Gait: deferred for patient safety.  Labs   CBC:  Recent Labs  Lab 09/05/22 1842  HGB 11.9*  HCT 35.0*    Basic Metabolic  Panel:  Lab Results  Component Value Date   NA 138 09/05/2022   K 4.9 09/05/2022   CO2 25 08/29/2021   GLUCOSE 131 (H) 09/05/2022   BUN 32 (H) 09/05/2022   CREATININE 1.30 (H) 09/05/2022   CALCIUM 7.9 (L) 08/29/2021   GFRNONAA >60 08/29/2021   GFRAA 50 (L) 02/16/2020   Lipid Panel: No results found for: "LDLCALC" HgbA1c:  Lab Results  Component Value Date   HGBA1C 5.7 (H) 10/16/2020   Urine Drug Screen:     Component Value Date/Time   LABOPIA NONE DETECTED 01/04/2020 2018   COCAINSCRNUR NONE DETECTED 01/04/2020 2018   LABBENZ NONE DETECTED 01/04/2020 2018   AMPHETMU NONE DETECTED 01/04/2020 2018   THCU NONE DETECTED 01/04/2020 2018   LABBARB NONE DETECTED 01/04/2020 2018    Alcohol Level     Component Value Date/Time   ETH <10 01/04/2020 1637    CT Head without contrast(Personally reviewed): CTH was negative for a large hypodensity concerning for a large territory infarct or hyperdensity concerning for an ICH  CT angio Head and Neck with contrast(Personally reviewed): No LVO.  MRI Brain: pending  rEEG:  If MRI is non revealing.  Impression   [name] is a 87 y.o. 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 She was brought in as a code stroke from home for aphasia, slurred speech, leaning to her left. Her neurologic examination is notable for expressive aphasia significantly out of proportion to confusion along with mild LLE weakness.  She was not a candidate for tnkase as she is on eliquis and LKW was over 4.5 hours ago and not a candidate for thrombectomy 2/2 no LVO.  Suspect she has a stroke given abrupt onset of her presentation and concern for aphasia out of proportion to confusion. However, per daughter has had a similar episode in the past in the setting of infection.  Recommendations  - MRI Brain w/o contrast - UA, CXR, CBC,  chemistry. - Folate, Thiamine, B12, Ammonia. - routine EEG. ______________________________________________________________________   Thank you for the opportunity to take part in the care of this patient. If you have any further questions, please contact the neurology consultation attending.  Signed,  Re[place] Pager Number [phone] _ _ _   _ __   _ __ _ _  __ __   _ __   __ _

## 2022-09-05 NOTE — ED Notes (Signed)
Pt feels warm-to-touch.  Rectal temp 99.6.

## 2022-09-05 NOTE — Hospital Course (Signed)
[name] is a 87 y.o. female with medical history significant for Alzheimer's dementia with behavioral disturbance, prior stroke without residual deficit, PE/DVT on Eliquis, HTN, HLD, hypothyroidism, depression/anxiety who presented with expressive aphasia and is admitted for CVA workup.

## 2022-09-05 NOTE — Assessment & Plan Note (Signed)
Holding lisinopril and metoprolol to allow permissive hypertension.

## 2022-09-05 NOTE — ED Provider Notes (Signed)
[facility] Provider Note   CSN: [id number] Arrival date & time: 09/05/22  1836  An emergency department physician performed an initial assessment on this suspected stroke patient at 77.  History  Chief Complaint  Patient presents with   Code Stroke    [name] is a 87 y.o. female.  HPI Patient presents as a code stroke.  She has dementia, but about 5 hours prior to ED arrival the patient was noticed to have difficulty with speech.  Unclear why there was a delay in bringing the patient to the emergency department. The patient self cannot provide any details, offers only 1 correct response when shown pictures to assess orientation and cognition.  Level 5 caveat secondary to acuity of condition. EMS reports the patient was confused and route with speech difficulty.    Home Medications Prior to Admission medications   Medication Sig Start Date End Date Taking? Authorizing Provider  apixaban (ELIQUIS) 5 MG TABS tablet Please take 2 tablets (10 mg) by mouth twice daily, until 3/18, then start taking 1 tablet (5 mg) by mouth twice daily on 3/19. 08/31/21   Elgergawy, Silver Huguenin, MD  Cholecalciferol (VITAMIN D3) 25 MCG (1000 UT) CAPS Take 1,000 Units by mouth daily. 05/29/21   [provider]  gabapentin (NEURONTIN) 100 MG capsule Take 1 cap in AM, 2 caps in PM 01/25/22   Cameron Sprang, MD  [facility] SPRINKLE 25 MG CS24 Take 25 mg by mouth daily. 05/21/21   [provider]  levothyroxine (SYNTHROID) 50 MCG tablet Take 75 mcg by mouth daily before breakfast.    [provider]  lisinopril (ZESTRIL) 5 MG tablet Take 5 mg by mouth in the morning.    [provider]  memantine (NAMENDA) 5 MG tablet Take 1 tablet (5 mg total) by mouth in the morning. 01/25/22   Cameron Sprang, MD  OLANZapine (ZYPREXA) 10 MG tablet TAKE 1/2 TABLET IN THE MORNING AND TAKE 1 TABLET AT BEDTIME 01/25/22   Cameron Sprang, MD  sertraline  (ZOLOFT) 50 MG tablet Take 1 tablet (50 mg total) by mouth daily. 01/25/22   Cameron Sprang, MD  simvastatin (ZOCOR) 20 MG tablet Take 20 mg by mouth at bedtime.     [provider]  traZODone (DESYREL) 50 MG tablet TAKE 2 TABLETS AT BEDTIME 01/25/22   Cameron Sprang, MD  vitamin B-12 (CYANOCOBALAMIN) 1000 MCG tablet Take 1,000 mcg by mouth daily. 05/29/21   [provider]      Allergies    Donepezil, Escitalopram oxalate, and Hydrochlorothiazide    Review of Systems   Review of Systems  Unable to perform ROS: Acuity of condition    Physical Exam Updated Vital Signs BP (!) 136/55   Pulse 89   Temp 99.6 F (37.6 C) (Rectal)   Resp 18   Wt 71.4 kg   SpO2 93%   BMI 29.74 kg/m  Physical Exam Vitals and nursing note reviewed.  Constitutional:      Appearance: She is well-developed.     Comments: Unwell appearing thin elderly female  HENT:     Head: Normocephalic and atraumatic.  Eyes:     Conjunctiva/sclera: Conjunctivae normal.  Cardiovascular:     Rate and Rhythm: Normal rate and regular rhythm.  Pulmonary:     Effort: Pulmonary effort is normal. No respiratory distress.     Breath sounds: Normal breath sounds. No stridor.  Abdominal:  General: There is no distension.  Skin:    General: Skin is warm and dry.  Neurological:     Cranial Nerves: No cranial nerve deficit.     Comments: Confused, delayed speech.  Face is symmetric, speech when offered is brief but clear Age-appropriate atrophy and she seems to move all extremity spontaneously, inconsistently to command.  Psychiatric:        Behavior: Behavior is slowed and withdrawn.        Cognition and Memory: Cognition is impaired. Memory is impaired.     ED Results / Procedures / Treatments   Labs (all labs ordered are listed, but only abnormal results are displayed) Labs Reviewed  PROTIME-INR - Abnormal; Notable for the following components:      Result Value   Prothrombin Time 16.2 (*)     INR 1.3 (*)    All other components within normal limits  CBC - Abnormal; Notable for the following components:   WBC 12.2 (*)    Hemoglobin 11.1 (*)    HCT 35.7 (*)    All other components within normal limits  DIFFERENTIAL - Abnormal; Notable for the following components:   Neutro Abs 11.1 (*)    Lymphs Abs 0.4 (*)    All other components within normal limits  COMPREHENSIVE METABOLIC PANEL - Abnormal; Notable for the following components:   Glucose, Bld 130 (*)    BUN 29 (*)    Creatinine, Ser 1.19 (*)    Total Protein 6.4 (*)    GFR, Estimated 45 (*)    All other components within normal limits  I-STAT CHEM 8, ED - Abnormal; Notable for the following components:   BUN 32 (*)    Creatinine, Ser 1.30 (*)    Glucose, Bld 131 (*)    Hemoglobin 11.9 (*)    HCT 35.0 (*)    All other components within normal limits  CBG MONITORING, ED - Abnormal; Notable for the following components:   Glucose-Capillary 128 (*)    All other components within normal limits  APTT  ETHANOL  URINALYSIS, ROUTINE W REFLEX MICROSCOPIC  AMMONIA  TSH  VITAMIN B12  FOLATE    EKG EKG Interpretation  Date/Time:  Sunday September 05 2022 19:21:25 EDT Ventricular Rate:  90 PR Interval:  174 QRS Duration: 101 QT Interval:  358 QTC Calculation: 438 R Axis:   67 Text Interpretation: Sinus rhythm Prominent P waves, nondiagnostic Baseline wander Abnormal ECG Confirmed by Carmin Muskrat [phone]) on 09/05/2022 9:28:02 PM  Radiology DG Chest 2 View  Result Date: 09/05/2022 CLINICAL DATA:  Possible infection EXAM: CHEST - 2 VIEW COMPARISON:  09/07/2021 FINDINGS: Heart borderline in size. Mild hyperinflation. No confluent airspace opacity or effusion. No acute bony abnormality. IMPRESSION: No active cardiopulmonary disease. Electronically Signed   By: Rolm Baptise M.D.   On: 09/05/2022 20:12   CT HEAD CODE STROKE WO CONTRAST  Result Date: 09/05/2022 CLINICAL DATA:  Aphasia EXAM: CT HEAD WITHOUT CONTRAST CT  ANGIOGRAPHY OF THE HEAD AND NECK TECHNIQUE: Contiguous axial images were obtained from the base of the skull through the vertex without intravenous contrast. Multidetector CT imaging of the head and neck was performed using the standard protocol during bolus administration of intravenous contrast. Multiplanar CT image reconstructions and MIPs were obtained to evaluate the vascular anatomy. Carotid stenosis measurements (when applicable) are obtained utilizing NASCET criteria, using the distal internal carotid diameter as the denominator. RADIATION DOSE REDUCTION: This exam was performed according to the departmental dose-optimization program  which includes automated exposure control, adjustment of the mA and/or kV according to patient size and/or use of iterative reconstruction technique. CONTRAST:  46mL ISOVUE-370 IOPAMIDOL (ISOVUE-370) INJECTION 76% COMPARISON:  None Available. FINDINGS: CT HEAD Brain: There is no mass, hemorrhage or extra-axial collection. The size and configuration of the ventricles and extra-axial CSF spaces are normal. There is hypoattenuation of the periventricular white matter, most commonly indicating chronic ischemic microangiopathy. Skull: The visualized skull base, calvarium and extracranial soft tissues are normal. Sinuses/Orbits: No fluid levels or advanced mucosal thickening of the visualized paranasal sinuses. No mastoid or middle ear effusion. The orbits are normal. ASPECTS ([place] Stroke Program Early CT Score) - Ganglionic level infarction (caudate, lentiform nuclei, internal capsule, insula, M1-M3 cortex): 7 - Supraganglionic infarction (M4-M6 cortex): 3 Total score (0-10 with 10 being normal): 10 CTA NECK FINDINGS SKELETON: There is no bony spinal canal stenosis. No lytic or blastic lesion. OTHER NECK: Normal pharynx, larynx and major salivary glands. No cervical lymphadenopathy. Unremarkable thyroid gland. UPPER CHEST: No pneumothorax or pleural effusion. No nodules or masses.  AORTIC ARCH: There is no calcific atherosclerosis of the aortic arch. There is no aneurysm, dissection or hemodynamically significant stenosis of the visualized portion of the aorta. Conventional 3 vessel aortic branching pattern. The visualized proximal subclavian arteries are widely patent. RIGHT CAROTID SYSTEM: No dissection, occlusion or aneurysm. Mild atherosclerotic calcification at the carotid bifurcation without hemodynamically significant stenosis. LEFT CAROTID SYSTEM: No dissection, occlusion or aneurysm. Mild atherosclerotic calcification at the carotid bifurcation without hemodynamically significant stenosis. VERTEBRAL ARTERIES: Left dominant configuration. Both origins are clearly patent. There is no dissection, occlusion or flow-limiting stenosis to the skull base (V1-V3 segments). CTA HEAD FINDINGS POSTERIOR CIRCULATION: --Vertebral arteries: Bilateral atherosclerotic calcification with moderate right and mild left stenosis. --Inferior cerebellar arteries: Normal. --Basilar artery: Normal. --Superior cerebellar arteries: Normal. --Posterior cerebral arteries (PCA): Normal. ANTERIOR CIRCULATION: --Intracranial internal carotid arteries: Atherosclerotic calcification of the internal carotid arteries at the skull base without hemodynamically significant stenosis. --Anterior cerebral arteries (ACA): Normal. Both A1 segments are present. Patent anterior communicating artery (a-comm). --Middle cerebral arteries (MCA): Normal. VENOUS SINUSES: As permitted by contrast timing, patent. ANATOMIC VARIANTS: None Review of the MIP images confirms the above findings. IMPRESSION: 1. No intracranial hemorrhage. ASPECTS is 10. 2. No emergent large vessel occlusion. 3. Moderate right and mild left vertebral artery segment V4 stenosis secondary to atherosclerotic calcification. 4. Mild bilateral carotid bifurcation atherosclerosis without hemodynamically significant stenosis. These results were called by telephone at the  time of interpretation on 09/05/2022 at 7:15 pm to provider [facility] , who verbally acknowledged these results. Electronically Signed   By: Ulyses Jarred M.D.   On: 09/05/2022 19:22   CT ANGIO HEAD NECK W WO CM (CODE STROKE)  Result Date: 09/05/2022 CLINICAL DATA:  Aphasia EXAM: CT HEAD WITHOUT CONTRAST CT ANGIOGRAPHY OF THE HEAD AND NECK TECHNIQUE: Contiguous axial images were obtained from the base of the skull through the vertex without intravenous contrast. Multidetector CT imaging of the head and neck was performed using the standard protocol during bolus administration of intravenous contrast. Multiplanar CT image reconstructions and MIPs were obtained to evaluate the vascular anatomy. Carotid stenosis measurements (when applicable) are obtained utilizing NASCET criteria, using the distal internal carotid diameter as the denominator. RADIATION DOSE REDUCTION: This exam was performed according to the departmental dose-optimization program which includes automated exposure control, adjustment of the mA and/or kV according to patient size and/or use of iterative reconstruction technique. CONTRAST:  58mL ISOVUE-370  IOPAMIDOL (ISOVUE-370) INJECTION 76% COMPARISON:  None Available. FINDINGS: CT HEAD Brain: There is no mass, hemorrhage or extra-axial collection. The size and configuration of the ventricles and extra-axial CSF spaces are normal. There is hypoattenuation of the periventricular white matter, most commonly indicating chronic ischemic microangiopathy. Skull: The visualized skull base, calvarium and extracranial soft tissues are normal. Sinuses/Orbits: No fluid levels or advanced mucosal thickening of the visualized paranasal sinuses. No mastoid or middle ear effusion. The orbits are normal. ASPECTS ([place] Stroke Program Early CT Score) - Ganglionic level infarction (caudate, lentiform nuclei, internal capsule, insula, M1-M3 cortex): 7 - Supraganglionic infarction (M4-M6 cortex): 3 Total  score (0-10 with 10 being normal): 10 CTA NECK FINDINGS SKELETON: There is no bony spinal canal stenosis. No lytic or blastic lesion. OTHER NECK: Normal pharynx, larynx and major salivary glands. No cervical lymphadenopathy. Unremarkable thyroid gland. UPPER CHEST: No pneumothorax or pleural effusion. No nodules or masses. AORTIC ARCH: There is no calcific atherosclerosis of the aortic arch. There is no aneurysm, dissection or hemodynamically significant stenosis of the visualized portion of the aorta. Conventional 3 vessel aortic branching pattern. The visualized proximal subclavian arteries are widely patent. RIGHT CAROTID SYSTEM: No dissection, occlusion or aneurysm. Mild atherosclerotic calcification at the carotid bifurcation without hemodynamically significant stenosis. LEFT CAROTID SYSTEM: No dissection, occlusion or aneurysm. Mild atherosclerotic calcification at the carotid bifurcation without hemodynamically significant stenosis. VERTEBRAL ARTERIES: Left dominant configuration. Both origins are clearly patent. There is no dissection, occlusion or flow-limiting stenosis to the skull base (V1-V3 segments). CTA HEAD FINDINGS POSTERIOR CIRCULATION: --Vertebral arteries: Bilateral atherosclerotic calcification with moderate right and mild left stenosis. --Inferior cerebellar arteries: Normal. --Basilar artery: Normal. --Superior cerebellar arteries: Normal. --Posterior cerebral arteries (PCA): Normal. ANTERIOR CIRCULATION: --Intracranial internal carotid arteries: Atherosclerotic calcification of the internal carotid arteries at the skull base without hemodynamically significant stenosis. --Anterior cerebral arteries (ACA): Normal. Both A1 segments are present. Patent anterior communicating artery (a-comm). --Middle cerebral arteries (MCA): Normal. VENOUS SINUSES: As permitted by contrast timing, patent. ANATOMIC VARIANTS: None Review of the MIP images confirms the above findings. IMPRESSION: 1. No intracranial  hemorrhage. ASPECTS is 10. 2. No emergent large vessel occlusion. 3. Moderate right and mild left vertebral artery segment V4 stenosis secondary to atherosclerotic calcification. 4. Mild bilateral carotid bifurcation atherosclerosis without hemodynamically significant stenosis. These results were called by telephone at the time of interpretation on 09/05/2022 at 7:15 pm to provider [facility] , who verbally acknowledged these results. Electronically Signed   By: Ulyses Jarred M.D.   On: 09/05/2022 19:22    Procedures Procedures    Medications Ordered in ED Medications  iopamidol (ISOVUE-370) 76 % injection 75 mL (75 mLs Intravenous Contrast Given 09/05/22 1913)    ED Course/ Medical Decision Making/ A&P                             Medical Decision Making Patient with multiple medical issues most notably dementia, prior stroke, prior infection presents with expressive aphasia.  Concern for progression of disease versus stroke versus infection versus encephalopathy.  Patient designated code stroke, head expeditious evaluation, head CT reviewed in the radiology suite, no evidence for hemorrhage.  Case discussed, manage with our neurology colleagues.   Amount and/or Complexity of Data Reviewed Independent Historian: caregiver    Details: Daughter arrives after initial evaluation External Data Reviewed: notes. Labs: ordered. Decision-making details documented in ED Course. Radiology: ordered and independent interpretation performed. Decision-making details documented  in ED Course. ECG/medicine tests: ordered and independent interpretation performed. Decision-making details documented in ED Course.  Risk Prescription drug management. Decision regarding hospitalization.   9:29 PM Daughter now at bedside.  We reviewed findings thus far no evidence for urinary tract infection, no obvious pneumonia.  CT without hemorrhage, obvious mass, voice clear evidence for degenerative changes.  MRI  pending.  With concern for ongoing expressive aphasia and this patient with dementia, multiple other medical problems including prior stroke patient will be admitted for further monitoring, management.        Final Clinical Impression(s) / ED Diagnoses Final diagnoses:  Expressive aphasia     Carmin Muskrat, MD 09/05/22 2129

## 2022-09-05 NOTE — Assessment & Plan Note (Signed)
Mild with creatinine 1.19 on admission compared to baseline around 0.9.  Appears to be mildly dehydrated.  Review of labs suggests she may have borderline CKD stage II-IIIa. -IV fluid hydration overnight -Hold lisinopril for now

## 2022-09-05 NOTE — Assessment & Plan Note (Signed)
-   Continue Eliquis 

## 2022-09-05 NOTE — Assessment & Plan Note (Signed)
Lives at home with family.  Normally ambulates without assistance, feed self, uses restroom on her own at baseline. -Delirium and fall precautions -Continue Namenda, Zyprexa, Zoloft, trazodone

## 2022-09-05 NOTE — ED Triage Notes (Signed)
Pt arrived via [facility] from home as a code stroke. Pt LKW 1300 today per family. Per EMS, pt has aphasia, left sided weakness and slurred speech. Pt takes eliquis. Per EMS, pt is A&Ox4, but does have a hx of dementia and sundowns.

## 2022-09-05 NOTE — H&P (Signed)
History and Physical    [name] [id number] DOB: [birth year] DOA: 09/05/2022  PCP: Maurice Small, MD  Patient coming from: Home  I have personally briefly reviewed patient's old medical records in [place]  Chief Complaint: Aphasia  HPI: [name] is a 87 y.o. female with medical history significant for Alzheimer's dementia with behavioral disturbance, prior stroke without residual deficit, PE/DVT on Eliquis, HTN, HLD, hypothyroidism, depression/anxiety who presented to the ED for evaluation of aphasia.  History is limited from patient due to dementia and expressive aphasia and is otherwise supplemented by EDP, chart review, and granddaughter at bedside.  Patient lives with granddaughter.  Per family patient has dementia.  At baseline she ambulates on her own, feed self, uses bathroom on her own.  Family does assist with bathing due to safety concerns with water temperature.  After lunch today around 1230-1 PM patient was noted to be sitting in her recliner bent over staring at her feet and leaning to her left.  Family thought she may be having a bad day related to her dementia but patient persisted in this position for over an hour.  Her granddaughter tried to speak with her but speech was not coherent.  She is try to stand her up but patient cannot stand on her own or with assistance.  EMS were called and she was brought to the ED for further evaluation.  Patient otherwise denies any nausea, vomiting, chest pain, dyspnea, abdominal pain, dysuria.  Family has not noticed any skin changes, wounds, rash.  Granddaughter states she thoroughly inspects her skin during bathing due to a prior history of wound infection.  She has not had any recent medication changes.  She has been adherent to her Eliquis.  ED Course  Labs/Imaging on admission: I have personally reviewed following labs and imaging studies.  Initial vitals showed BP 136/55, pulse 89, RR 18, temp 99.2 F, SpO2 93%  on room air.  Labs show sodium 136, potassium 4.8, bicarb 22, BUN 29, creatinine 1.19, serum glucose 130, LFTs within normal limits, WBC 12.2, hemoglobin 11.1, platelets 239,000, serum ethanol <10.  Urinalysis is negative for UTI.  CT head and CTA head/neck were obtained.  These were negative for intracranial hemorrhage.  No emergent LVO.  Moderate right and mild left vertebral artery segment V4 stenosis and mild bilateral carotid bifurcation atherosclerosis without hemodynamically significant stenosis noted.  2 view chest x-ray negative for focal consolidation, edema, effusion.  Neurology were consulted and recommended medical admission for further CVA workup as well as routine EEG.  The hospitalist service was consulted to admit for further evaluation and management.  Review of Systems:  All systems reviewed and are negative except as documented in history of present illness above.   Past Medical History:  Diagnosis Date   Anxiety    Arthritis    Depression    Hypertension    Pre-diabetes    Stroke ([place])    6 years ago   Thyroid disease     Past Surgical History:  Procedure Laterality Date   ABDOMINAL HYSTERECTOMY     CHOLECYSTECTOMY     MULTIPLE TOOTH EXTRACTIONS     ROTATOR CUFF REPAIR Left    TOTAL HIP ARTHROPLASTY Left 10/22/2020   Procedure: LEFT TOTAL HIP ARTHROPLASTY ANTERIOR APPROACH;  Surgeon: Frederik Pear, MD;  Location: [facility];  Service: Orthopedics;  Laterality: Left;    Social History:  reports that she has never smoked. She has never used smokeless tobacco. She  reports that she does not drink alcohol and does not use drugs.  Allergies  Allergen Reactions   Donepezil Other (See Comments)    POSSIBLE (??) Hallucinations and sleep disturbances   Escitalopram Oxalate Other (See Comments)    UNK reaction   Hydrochlorothiazide Other (See Comments)    UNK reaction    History reviewed. No pertinent family history.   Prior to Admission medications    Medication Sig Start Date End Date Taking? Authorizing Provider  apixaban (ELIQUIS) 5 MG TABS tablet Please take 2 tablets (10 mg) by mouth twice daily, until 3/18, then start taking 1 tablet (5 mg) by mouth twice daily on 3/19. 08/31/21   Elgergawy, Silver Huguenin, MD  Cholecalciferol (VITAMIN D3) 25 MCG (1000 UT) CAPS Take 1,000 Units by mouth daily. 05/29/21   [provider]  gabapentin (NEURONTIN) 100 MG capsule Take 1 cap in AM, 2 caps in PM 01/25/22   Cameron Sprang, MD  [facility] SPRINKLE 25 MG CS24 Take 25 mg by mouth daily. 05/21/21   [provider]  levothyroxine (SYNTHROID) 50 MCG tablet Take 75 mcg by mouth daily before breakfast.    [provider]  lisinopril (ZESTRIL) 5 MG tablet Take 5 mg by mouth in the morning.    [provider]  memantine (NAMENDA) 5 MG tablet Take 1 tablet (5 mg total) by mouth in the morning. 01/25/22   Cameron Sprang, MD  OLANZapine (ZYPREXA) 10 MG tablet TAKE 1/2 TABLET IN THE MORNING AND TAKE 1 TABLET AT BEDTIME 01/25/22   Cameron Sprang, MD  sertraline (ZOLOFT) 50 MG tablet Take 1 tablet (50 mg total) by mouth daily. 01/25/22   Cameron Sprang, MD  simvastatin (ZOCOR) 20 MG tablet Take 20 mg by mouth at bedtime.     [provider]  traZODone (DESYREL) 50 MG tablet TAKE 2 TABLETS AT BEDTIME 01/25/22   Cameron Sprang, MD  vitamin B-12 (CYANOCOBALAMIN) 1000 MCG tablet Take 1,000 mcg by mouth daily. 05/29/21   [provider]    Physical Exam: Vitals:   09/05/22 1842 09/05/22 1917 09/05/22 1920 09/05/22 2018  BP:   (!) 136/55   Pulse:   89   Resp:   18   Temp:   99.2 F (37.3 C) 99.6 F (37.6 C)  TempSrc:   Oral Rectal  SpO2:  94% 93%   Weight: 71.4 kg      Constitutional: Resting in bed, NAD, calm, comfortable Eyes: PERRL, EOMI, lids and conjunctivae normal ENMT: Mucous membranes are moist. Posterior pharynx clear of any exudate or lesions.Normal dentition.  Neck: normal, supple, no masses. Respiratory:  clear to auscultation bilaterally, no wheezing, no crackles. Normal respiratory effort. No accessory muscle use.  Cardiovascular: Regular rate and rhythm, no murmurs / rubs / gallops. No extremity edema. 2+ pedal pulses. Abdomen: no tenderness, no masses palpated.  Musculoskeletal: no clubbing / cyanosis. No joint deformity upper and lower extremities. Good ROM, no contractures. Normal muscle tone.  Skin: no rashes, lesions, ulcers. No induration Neurologic: Exhibits intermittent episodes of expressive aphasia, no dysarthria. Sensation intact. Strength 4/5 LUE otherwise 5/5 all other extremities. Psychiatric: Alert and oriented to self only, recognizes granddaughter at bedside.  EKG: Personally reviewed. Sinus rhythm, rate 90, no acute ischemic changes, wandering leads.  Rate is faster when compared to prior.  Assessment/Plan Principal Problem:   Expressive aphasia Active Problems:   AKI (acute kidney injury) ([place])   Leukocytosis   Essential hypertension   History  of pulmonary embolism   Dementia with behavioral disturbance   Hx of completed stroke   Hypothyroidism, unspecified   TALIAH THULIN is a 87 y.o. female with medical history significant for Alzheimer's dementia with behavioral disturbance, prior stroke without residual deficit, PE/DVT on Eliquis, HTN, HLD, hypothyroidism, depression/anxiety who presented with expressive aphasia and is admitted for CVA workup.  Assessment and Plan: * Expressive aphasia Presenting with expressive aphasia, slurred speech, leaning to her left.  Neurology following. -CTA head/neck negative for LVO or significant stenosis -MRI brain negative for acute abnormality -Keep on telemetry, continue neurochecks -PT/OT/SLP eval -Check A1c and lipid panel -Routine EEG per neurology -Follow ammonia, B12, folate, thiamine -Allow permissive hypertension for now -IV fluid hydration overnight  AKI (acute kidney injury) ([place]) Mild with creatinine 1.19 on  admission compared to baseline around 0.9.  Appears to be mildly dehydrated.  Review of labs suggests she may have borderline CKD stage II-IIIa. -IV fluid hydration overnight -Hold lisinopril for now  Leukocytosis Mildly elevated WBC 12.2.  No obvious infectious process.  UA negative for UTI.  CXR without evidence of pneumonia.  Rectal temp 99.6 F. -Check COVID, flu, RSV panel -Repeat CBC in a.m.  Essential hypertension Holding lisinopril and metoprolol to allow permissive hypertension.  History of pulmonary embolism Continue Eliquis.  Hypothyroidism, unspecified Continue Synthroid, follow-up TSH.  Dementia with behavioral disturbance Lives at home with family.  Normally ambulates without assistance, feed self, uses restroom on her own at baseline. -Delirium and fall precautions -Continue Namenda, Zyprexa, Zoloft, trazodone  DVT prophylaxis:  apixaban (ELIQUIS) tablet 5 mg   Code Status: Full code, discussed with family on admission Family Communication: Granddaughter at bedside Disposition Plan: From home and likely discharge to home pending clinical progress Consults called: Neurology Severity of Illness: The appropriate patient status for this patient is OBSERVATION. Observation status is judged to be reasonable and necessary in order to provide the required intensity of service to ensure the patient's safety. The patient's presenting symptoms, physical exam findings, and initial radiographic and laboratory data in the context of their medical condition is felt to place them at decreased risk for further clinical deterioration. Furthermore, it is anticipated that the patient will be medically stable for discharge from the hospital within 2 midnights of admission.   Zada Finders MD [facility]  If 7PM-7AM, please contact night-coverage [email or web address]  09/05/2022, 10:28 PM

## 2022-09-05 NOTE — Assessment & Plan Note (Signed)
Continue Synthroid, follow-up TSH.

## 2022-09-05 NOTE — ED Notes (Signed)
Pt taken to MRI  

## 2022-09-06 ENCOUNTER — Observation Stay (HOSPITAL_COMMUNITY): Payer: Medicare PPO

## 2022-09-06 DIAGNOSIS — N179 Acute kidney failure, unspecified: Secondary | ICD-10-CM | POA: Diagnosis not present

## 2022-09-06 DIAGNOSIS — F03918 Unspecified dementia, unspecified severity, with other behavioral disturbance: Secondary | ICD-10-CM | POA: Diagnosis not present

## 2022-09-06 DIAGNOSIS — I10 Essential (primary) hypertension: Secondary | ICD-10-CM

## 2022-09-06 DIAGNOSIS — R4701 Aphasia: Secondary | ICD-10-CM | POA: Diagnosis not present

## 2022-09-06 DIAGNOSIS — E039 Hypothyroidism, unspecified: Secondary | ICD-10-CM | POA: Diagnosis not present

## 2022-09-06 DIAGNOSIS — R2990 Unspecified symptoms and signs involving the nervous system: Secondary | ICD-10-CM | POA: Diagnosis not present

## 2022-09-06 DIAGNOSIS — G9341 Metabolic encephalopathy: Secondary | ICD-10-CM

## 2022-09-06 LAB — RESP PANEL BY RT-PCR (RSV, FLU A&B, COVID)  RVPGX2
Influenza A by PCR: NEGATIVE
Influenza B by PCR: NEGATIVE
Resp Syncytial Virus by PCR: NEGATIVE
SARS Coronavirus 2 by RT PCR: NEGATIVE

## 2022-09-06 LAB — LIPID PANEL
Cholesterol: 123 mg/dL (ref 0–200)
HDL: 63 mg/dL (ref >40)
LDL Cholesterol: 49 mg/dL (ref 0–99)
Total CHOL/HDL Ratio: 2 RATIO
Triglycerides: 54 mg/dL (ref <150)
VLDL: 11 mg/dL (ref 0–40)

## 2022-09-06 LAB — BASIC METABOLIC PANEL
Anion gap: 9 (ref 5–15)
BUN: 24 mg/dL — ABNORMAL HIGH (ref 8–23)
CO2: 24 mmol/L (ref 22–32)
Calcium: 8.3 mg/dL — ABNORMAL LOW (ref 8.9–10.3)
Chloride: 102 mmol/L (ref 98–111)
Creatinine, Ser: 1.06 mg/dL — ABNORMAL HIGH (ref 0.44–1.00)
GFR, Estimated: 51 mL/min — ABNORMAL LOW (ref >60)
Glucose, Bld: 105 mg/dL — ABNORMAL HIGH (ref 70–99)
Potassium: 3.8 mmol/L (ref 3.5–5.1)
Sodium: 135 mmol/L (ref 135–145)

## 2022-09-06 LAB — CBC
HCT: 30.7 % — ABNORMAL LOW (ref 36.0–46.0)
Hemoglobin: 9.8 g/dL — ABNORMAL LOW (ref 12.0–15.0)
MCH: 27.2 pg (ref 26.0–34.0)
MCHC: 31.9 g/dL (ref 30.0–36.0)
MCV: 85.3 fL (ref 80.0–100.0)
Platelets: 184 10*3/uL (ref 150–400)
RBC: 3.6 MIL/uL — ABNORMAL LOW (ref 3.87–5.11)
RDW: 14.2 % (ref 11.5–15.5)
WBC: 10.2 10*3/uL (ref 4.0–10.5)
nRBC: 0 % (ref 0.0–0.2)

## 2022-09-06 LAB — FOLATE: Folate: 8 ng/mL (ref >5.9)

## 2022-09-06 LAB — AMMONIA: Ammonia: 17 umol/L (ref 9–35)

## 2022-09-06 LAB — HEMOGLOBIN A1C
Hgb A1c MFr Bld: 6.1 % — ABNORMAL HIGH (ref 4.8–5.6)
Mean Plasma Glucose: 128 mg/dL

## 2022-09-06 LAB — VITAMIN B12: Vitamin B-12: 786 pg/mL (ref 180–914)

## 2022-09-06 LAB — TSH: TSH: 2.031 u[IU]/mL (ref 0.350–4.500)

## 2022-09-06 MED ORDER — APIXABAN 5 MG PO TABS: 5.0000 mg | ORAL_TABLET | Freq: Two times a day (BID) | ORAL | Status: DC

## 2022-09-06 NOTE — Discharge Summary (Signed)
PATIENT DETAILS Name: [name] Age: 87 y.o. Sex: female Date of Birth: Dec 15, 1935 MRN: [id number]. Admitting Physician: Lenore Cordia, MD [id number], Arbie Cookey, MD  Admit Date: 09/05/2022 Discharge date: 09/06/2022  Recommendations for Outpatient Follow-up:  Follow up with PCP in 1-2 weeks  Admitted From:  Home  Disposition: Home   Discharge Condition: good  CODE STATUS:   Code Status: Full Code   Diet recommendation:  Diet Order             Diet Heart Room service appropriate? No; Fluid consistency: Thin  Diet effective now           Diet - low sodium heart healthy                    Brief Summary: 87 year old with history of dementia, VTE on Eliquis, HTN, HLD who presented with expressive aphasia.    3/17>> MRI brain: No acute intracranial abnormality 3/17>> CTA head/neck: No emergent LVO, moderate right mild left vertebral artery V4 stenosis. 3/18>> LDL: 49 3/18>> A1c: Pending 3/18>>EEG:No seizures   Brief Hospital Course: Expressive aphasia Resolved-speaking fluently today MRI brain negative for CVA EEG negative for seizures Discussed with neurology-no further recommendations-stable for discharge-possibly etiology related to underlying dementia Has appt scheduled with Neuro-Dr Delice Lesch on 5/14  AKI Mild Hemodynamically mediated Improved with IVF  HTN Resume lisinopril/metoprolol on discharge  VTE Continue Eliquis  Hypothyroidism Synthroid  Dementia Pleasantly confused-speech slow but clear this morning Answers most of my questions appropriately Continue outpatient follow-up with PCP Resume usual outpatient regimen   Obesity: Estimated body mass index is 29.74 kg/m as calculated from the following:   Height as of 01/25/22: 5\' 1"  (1.549 m).   Weight as of this encounter: 71.4 kg.    Discharge Diagnoses:  Principal Problem:   Expressive aphasia Active Problems:   AKI (acute kidney injury) ([place])   Leukocytosis   Essential  hypertension   History of pulmonary embolism   Dementia with behavioral disturbance   Hx of completed stroke   Hypothyroidism, unspecified   Discharge Instructions:  Activity:  As tolerated with Full fall precautions use walker/cane & assistance as needed  Discharge Instructions     Diet - low sodium heart healthy   Complete by: As directed    Discharge instructions   Complete by: As directed    Follow with Primary MD  Maurice Small, MD in 1-2 weeks  Please get a complete blood count and chemistry panel checked by your Primary MD at your next visit, and again as instructed by your Primary MD.  Get Medicines reviewed and adjusted: Please take all your medications with you for your next visit with your Primary MD  Laboratory/radiological data: Please request your Primary MD to go over all hospital tests and procedure/radiological results at the follow up, please ask your Primary MD to get all Hospital records sent to his/her office.  In some cases, they will be blood work, cultures and biopsy results pending at the time of your discharge. Please request that your primary care M.D. follows up on these results.  Also Note the following: If you experience worsening of your admission symptoms, develop shortness of breath, life threatening emergency, suicidal or homicidal thoughts you must seek medical attention immediately by calling 911 or calling your MD immediately  if symptoms less severe.  You must read complete instructions/literature along with all the possible adverse reactions/side effects for all the Medicines you take and that have been prescribed  to you. Take any new Medicines after you have completely understood and accpet all the possible adverse reactions/side effects.   Do not drive when taking Pain medications or sleeping medications (Benzodaizepines)  Do not take more than prescribed Pain, Sleep and Anxiety Medications. It is not advisable to combine anxiety,sleep and  pain medications without talking with your primary care practitioner  Special Instructions: If you have smoked or chewed Tobacco  in the last 2 yrs please stop smoking, stop any regular Alcohol  and or any Recreational drug use.  Wear Seat belts while driving.  Please note: You were cared for by a hospitalist during your hospital stay. Once you are discharged, your primary care physician will handle any further medical issues. Please note that NO REFILLS for any discharge medications will be authorized once you are discharged, as it is imperative that you return to your primary care physician (or establish a relationship with a primary care physician if you do not have one) for your post hospital discharge needs so that they can reassess your need for medications and monitor your lab values.   Increase activity slowly   Complete by: As directed       Allergies as of 09/06/2022       Reactions   Donepezil Other (See Comments)   POSSIBLE (??) Hallucinations and sleep disturbances   Escitalopram Oxalate Other (See Comments)   UNK reaction   Hydrochlorothiazide Other (See Comments)   UNK reaction        Medication List     TAKE these medications    apixaban 5 MG Tabs tablet Commonly known as: ELIQUIS Take 1 tablet (5 mg total) by mouth 2 (two) times daily.   cyanocobalamin 1000 MCG tablet Commonly known as: VITAMIN B12 Take 1,000 mcg by mouth daily.   gabapentin 100 MG capsule Commonly known as: Neurontin Take 1 cap in AM, 2 caps in PM What changed:  how much to take how to take this when to take this additional instructions   levothyroxine 75 MCG tablet Commonly known as: SYNTHROID Take 75 mcg by mouth daily before breakfast.   lisinopril 5 MG tablet Commonly known as: ZESTRIL Take 5 mg by mouth in the morning.   memantine 5 MG tablet Commonly known as: NAMENDA Take 1 tablet (5 mg total) by mouth in the morning.   metoprolol succinate 25 MG 24 hr  tablet Commonly known as: TOPROL-XL Take 25 mg by mouth daily.   OLANZapine 10 MG tablet Commonly known as: ZYPREXA TAKE 1/2 TABLET IN THE MORNING AND TAKE 1 TABLET AT BEDTIME What changed:  how much to take how to take this when to take this additional instructions   sertraline 50 MG tablet Commonly known as: ZOLOFT Take 1 tablet (50 mg total) by mouth daily.   simvastatin 20 MG tablet Commonly known as: ZOCOR Take 20 mg by mouth at bedtime.   traZODone 50 MG tablet Commonly known as: DESYREL TAKE 2 TABLETS AT BEDTIME What changed:  how much to take how to take this when to take this additional instructions   Vitamin D3 25 MCG (1000 UT) capsule Generic drug: Cholecalciferol Take 1,000 Units by mouth daily.        Follow-up Information     Maurice Small, MD. Schedule an appointment as soon as possible for a visit in 1 week(s).   Specialty: Family Medicine Contact information: [address] [place] [address] [phone]  Cameron Sprang, MD. Daphane Shepherd on 11/02/2022.   Specialty: Neurology Why: appt at 2:30 pm Contact information: [address] [place] [address] [phone]                Allergies  Allergen Reactions   Donepezil Other (See Comments)    POSSIBLE (??) Hallucinations and sleep disturbances   Escitalopram Oxalate Other (See Comments)    UNK reaction   Hydrochlorothiazide Other (See Comments)    UNK reaction     Other Procedures/Studies: EEG adult  Result Date: September 13, 2022 Lora Havens, MD     13-Sep-2022 12:19 PM Patient Name: TORYN GUNDER MRN: [id number] Epilepsy Attending: Lora Havens Referring Physician/Provider: Donnetta Simpers, MD Date: 2022-09-13 Duration: 23.32 mins Patient history: 87yo F with ams. EEG to evaluate for seizure Level of alertness: Awake AEDs during EEG study: GBP Technical aspects: This EEG study was done with scalp electrodes positioned according to the  10-20 International system of electrode placement. Electrical activity was reviewed with band pass filter of 1-70Hz , sensitivity of 7 uV/mm, display speed of 66mm/sec with a 60Hz  notched filter applied as appropriate. EEG data were recorded continuously and digitally stored.  Video monitoring was available and reviewed as appropriate. Description: The posterior dominant rhythm consists of 8 Hz activity of moderate voltage (25-35 uV) seen predominantly in posterior head regions, symmetric and reactive to eye opening and eye closing.Photic driving was not seen during photic stimulation. Hyperventilation was not performed.   IMPRESSION: This study is within normal limits. No seizures or epileptiform discharges were seen throughout the recording. A normal interictal EEG does not exclude the diagnosis of epilepsy. Lora Havens   MR Brain Wo Contrast (neuro protocol)  Result Date: 09/05/2022 CLINICAL DATA:  Acute neurologic deficit EXAM: MRI HEAD WITHOUT CONTRAST TECHNIQUE: Multiplanar, multiecho pulse sequences of the brain and surrounding structures were obtained without intravenous contrast. COMPARISON:  None Available. FINDINGS: Brain: No acute infarct, mass effect or extra-axial collection. Chronic microhemorrhages in the left temporal and occipital lobes. There is multifocal hyperintense T2-weighted signal within the white matter. Parenchymal volume and CSF spaces are normal. The midline structures are normal. Vascular: Normal flow voids. Skull and upper cervical spine: Normal marrow signal. Sinuses/Orbits: Negative. Other: None. IMPRESSION: 1. No acute intracranial abnormality. 2. Findings of chronic small vessel ischemia. Electronically Signed   By: Ulyses Jarred M.D.   On: 09/05/2022 21:48   DG Chest 2 View  Result Date: 09/05/2022 CLINICAL DATA:  Possible infection EXAM: CHEST - 2 VIEW COMPARISON:  09/07/2021 FINDINGS: Heart borderline in size. Mild hyperinflation. No confluent airspace opacity or  effusion. No acute bony abnormality. IMPRESSION: No active cardiopulmonary disease. Electronically Signed   By: Rolm Baptise M.D.   On: 09/05/2022 20:12   CT HEAD CODE STROKE WO CONTRAST  Result Date: 09/05/2022 CLINICAL DATA:  Aphasia EXAM: CT HEAD WITHOUT CONTRAST CT ANGIOGRAPHY OF THE HEAD AND NECK TECHNIQUE: Contiguous axial images were obtained from the base of the skull through the vertex without intravenous contrast. Multidetector CT imaging of the head and neck was performed using the standard protocol during bolus administration of intravenous contrast. Multiplanar CT image reconstructions and MIPs were obtained to evaluate the vascular anatomy. Carotid stenosis measurements (when applicable) are obtained utilizing NASCET criteria, using the distal internal carotid diameter as the denominator. RADIATION DOSE REDUCTION: This exam was performed according to the departmental dose-optimization program which includes automated exposure control, adjustment of the mA and/or kV according to patient  size and/or use of iterative reconstruction technique. CONTRAST:  68mL ISOVUE-370 IOPAMIDOL (ISOVUE-370) INJECTION 76% COMPARISON:  None Available. FINDINGS: CT HEAD Brain: There is no mass, hemorrhage or extra-axial collection. The size and configuration of the ventricles and extra-axial CSF spaces are normal. There is hypoattenuation of the periventricular white matter, most commonly indicating chronic ischemic microangiopathy. Skull: The visualized skull base, calvarium and extracranial soft tissues are normal. Sinuses/Orbits: No fluid levels or advanced mucosal thickening of the visualized paranasal sinuses. No mastoid or middle ear effusion. The orbits are normal. ASPECTS ([place] Stroke Program Early CT Score) - Ganglionic level infarction (caudate, lentiform nuclei, internal capsule, insula, M1-M3 cortex): 7 - Supraganglionic infarction (M4-M6 cortex): 3 Total score (0-10 with 10 being normal): 10 CTA NECK  FINDINGS SKELETON: There is no bony spinal canal stenosis. No lytic or blastic lesion. OTHER NECK: Normal pharynx, larynx and major salivary glands. No cervical lymphadenopathy. Unremarkable thyroid gland. UPPER CHEST: No pneumothorax or pleural effusion. No nodules or masses. AORTIC ARCH: There is no calcific atherosclerosis of the aortic arch. There is no aneurysm, dissection or hemodynamically significant stenosis of the visualized portion of the aorta. Conventional 3 vessel aortic branching pattern. The visualized proximal subclavian arteries are widely patent. RIGHT CAROTID SYSTEM: No dissection, occlusion or aneurysm. Mild atherosclerotic calcification at the carotid bifurcation without hemodynamically significant stenosis. LEFT CAROTID SYSTEM: No dissection, occlusion or aneurysm. Mild atherosclerotic calcification at the carotid bifurcation without hemodynamically significant stenosis. VERTEBRAL ARTERIES: Left dominant configuration. Both origins are clearly patent. There is no dissection, occlusion or flow-limiting stenosis to the skull base (V1-V3 segments). CTA HEAD FINDINGS POSTERIOR CIRCULATION: --Vertebral arteries: Bilateral atherosclerotic calcification with moderate right and mild left stenosis. --Inferior cerebellar arteries: Normal. --Basilar artery: Normal. --Superior cerebellar arteries: Normal. --Posterior cerebral arteries (PCA): Normal. ANTERIOR CIRCULATION: --Intracranial internal carotid arteries: Atherosclerotic calcification of the internal carotid arteries at the skull base without hemodynamically significant stenosis. --Anterior cerebral arteries (ACA): Normal. Both A1 segments are present. Patent anterior communicating artery (a-comm). --Middle cerebral arteries (MCA): Normal. VENOUS SINUSES: As permitted by contrast timing, patent. ANATOMIC VARIANTS: None Review of the MIP images confirms the above findings. IMPRESSION: 1. No intracranial hemorrhage. ASPECTS is 10. 2. No emergent  large vessel occlusion. 3. Moderate right and mild left vertebral artery segment V4 stenosis secondary to atherosclerotic calcification. 4. Mild bilateral carotid bifurcation atherosclerosis without hemodynamically significant stenosis. These results were called by telephone at the time of interpretation on 09/05/2022 at 7:15 pm to provider [facility] , who verbally acknowledged these results. Electronically Signed   By: Ulyses Jarred M.D.   On: 09/05/2022 19:22   CT ANGIO HEAD NECK W WO CM (CODE STROKE)  Result Date: 09/05/2022 CLINICAL DATA:  Aphasia EXAM: CT HEAD WITHOUT CONTRAST CT ANGIOGRAPHY OF THE HEAD AND NECK TECHNIQUE: Contiguous axial images were obtained from the base of the skull through the vertex without intravenous contrast. Multidetector CT imaging of the head and neck was performed using the standard protocol during bolus administration of intravenous contrast. Multiplanar CT image reconstructions and MIPs were obtained to evaluate the vascular anatomy. Carotid stenosis measurements (when applicable) are obtained utilizing NASCET criteria, using the distal internal carotid diameter as the denominator. RADIATION DOSE REDUCTION: This exam was performed according to the departmental dose-optimization program which includes automated exposure control, adjustment of the mA and/or kV according to patient size and/or use of iterative reconstruction technique. CONTRAST:  50mL ISOVUE-370 IOPAMIDOL (ISOVUE-370) INJECTION 76% COMPARISON:  None Available. FINDINGS: CT HEAD Brain: There is  no mass, hemorrhage or extra-axial collection. The size and configuration of the ventricles and extra-axial CSF spaces are normal. There is hypoattenuation of the periventricular white matter, most commonly indicating chronic ischemic microangiopathy. Skull: The visualized skull base, calvarium and extracranial soft tissues are normal. Sinuses/Orbits: No fluid levels or advanced mucosal thickening of the visualized  paranasal sinuses. No mastoid or middle ear effusion. The orbits are normal. ASPECTS ([place] Stroke Program Early CT Score) - Ganglionic level infarction (caudate, lentiform nuclei, internal capsule, insula, M1-M3 cortex): 7 - Supraganglionic infarction (M4-M6 cortex): 3 Total score (0-10 with 10 being normal): 10 CTA NECK FINDINGS SKELETON: There is no bony spinal canal stenosis. No lytic or blastic lesion. OTHER NECK: Normal pharynx, larynx and major salivary glands. No cervical lymphadenopathy. Unremarkable thyroid gland. UPPER CHEST: No pneumothorax or pleural effusion. No nodules or masses. AORTIC ARCH: There is no calcific atherosclerosis of the aortic arch. There is no aneurysm, dissection or hemodynamically significant stenosis of the visualized portion of the aorta. Conventional 3 vessel aortic branching pattern. The visualized proximal subclavian arteries are widely patent. RIGHT CAROTID SYSTEM: No dissection, occlusion or aneurysm. Mild atherosclerotic calcification at the carotid bifurcation without hemodynamically significant stenosis. LEFT CAROTID SYSTEM: No dissection, occlusion or aneurysm. Mild atherosclerotic calcification at the carotid bifurcation without hemodynamically significant stenosis. VERTEBRAL ARTERIES: Left dominant configuration. Both origins are clearly patent. There is no dissection, occlusion or flow-limiting stenosis to the skull base (V1-V3 segments). CTA HEAD FINDINGS POSTERIOR CIRCULATION: --Vertebral arteries: Bilateral atherosclerotic calcification with moderate right and mild left stenosis. --Inferior cerebellar arteries: Normal. --Basilar artery: Normal. --Superior cerebellar arteries: Normal. --Posterior cerebral arteries (PCA): Normal. ANTERIOR CIRCULATION: --Intracranial internal carotid arteries: Atherosclerotic calcification of the internal carotid arteries at the skull base without hemodynamically significant stenosis. --Anterior cerebral arteries (ACA): Normal. Both  A1 segments are present. Patent anterior communicating artery (a-comm). --Middle cerebral arteries (MCA): Normal. VENOUS SINUSES: As permitted by contrast timing, patent. ANATOMIC VARIANTS: None Review of the MIP images confirms the above findings. IMPRESSION: 1. No intracranial hemorrhage. ASPECTS is 10. 2. No emergent large vessel occlusion. 3. Moderate right and mild left vertebral artery segment V4 stenosis secondary to atherosclerotic calcification. 4. Mild bilateral carotid bifurcation atherosclerosis without hemodynamically significant stenosis. These results were called by telephone at the time of interpretation on 09/05/2022 at 7:15 pm to provider [facility] , who verbally acknowledged these results. Electronically Signed   By: Ulyses Jarred M.D.   On: 09/05/2022 19:22     TODAY-DAY OF DISCHARGE:  Subjective:   [name] today has no headache,no chest abdominal pain,no new weakness tingling or numbness, feels much better wants to go home today.   Objective:   Blood pressure (!) 113/96, pulse 70, temperature 98.4 F (36.9 C), temperature source Oral, resp. rate 17, weight 71.4 kg, SpO2 (!) 73 %. No intake or output data in the 24 hours ending 2022/09/27 1243 Filed Weights   09/05/22 1842  Weight: 71.4 kg    Exam: Awake Alert, Oriented *3, No new F.N deficits, Normal affect [place].AT,PERRAL Supple Neck,No JVD, No cervical lymphadenopathy appriciated.  Symmetrical Chest wall movement, Good air movement bilaterally, CTAB RRR,No Gallops,Rubs or new Murmurs, No Parasternal Heave +ve B.Sounds, Abd Soft, Non tender, No organomegaly appriciated, No rebound -guarding or rigidity. No Cyanosis, Clubbing or edema, No new Rash or bruise   PERTINENT RADIOLOGIC STUDIES: EEG adult  Result Date: 27-Sep-2022 Lora Havens, MD     09-27-2022 12:19 PM Patient Name: [name] MRN: [id number] Epilepsy  Attending: Lora Havens Referring Physician/Provider: Donnetta Simpers, MD Date:  09/06/2022 Duration: 23.32 mins Patient history: 87yo F with ams. EEG to evaluate for seizure Level of alertness: Awake AEDs during EEG study: GBP Technical aspects: This EEG study was done with scalp electrodes positioned according to the 10-20 International system of electrode placement. Electrical activity was reviewed with band pass filter of 1-70Hz , sensitivity of 7 uV/mm, display speed of 21mm/sec with a 60Hz  notched filter applied as appropriate. EEG data were recorded continuously and digitally stored.  Video monitoring was available and reviewed as appropriate. Description: The posterior dominant rhythm consists of 8 Hz activity of moderate voltage (25-35 uV) seen predominantly in posterior head regions, symmetric and reactive to eye opening and eye closing.Photic driving was not seen during photic stimulation. Hyperventilation was not performed.   IMPRESSION: This study is within normal limits. No seizures or epileptiform discharges were seen throughout the recording. A normal interictal EEG does not exclude the diagnosis of epilepsy. Lora Havens   MR Brain Wo Contrast (neuro protocol)  Result Date: 09/05/2022 CLINICAL DATA:  Acute neurologic deficit EXAM: MRI HEAD WITHOUT CONTRAST TECHNIQUE: Multiplanar, multiecho pulse sequences of the brain and surrounding structures were obtained without intravenous contrast. COMPARISON:  None Available. FINDINGS: Brain: No acute infarct, mass effect or extra-axial collection. Chronic microhemorrhages in the left temporal and occipital lobes. There is multifocal hyperintense T2-weighted signal within the white matter. Parenchymal volume and CSF spaces are normal. The midline structures are normal. Vascular: Normal flow voids. Skull and upper cervical spine: Normal marrow signal. Sinuses/Orbits: Negative. Other: None. IMPRESSION: 1. No acute intracranial abnormality. 2. Findings of chronic small vessel ischemia. Electronically Signed   By: Ulyses Jarred M.D.    On: 09/05/2022 21:48   DG Chest 2 View  Result Date: 09/05/2022 CLINICAL DATA:  Possible infection EXAM: CHEST - 2 VIEW COMPARISON:  09/07/2021 FINDINGS: Heart borderline in size. Mild hyperinflation. No confluent airspace opacity or effusion. No acute bony abnormality. IMPRESSION: No active cardiopulmonary disease. Electronically Signed   By: Rolm Baptise M.D.   On: 09/05/2022 20:12   CT HEAD CODE STROKE WO CONTRAST  Result Date: 09/05/2022 CLINICAL DATA:  Aphasia EXAM: CT HEAD WITHOUT CONTRAST CT ANGIOGRAPHY OF THE HEAD AND NECK TECHNIQUE: Contiguous axial images were obtained from the base of the skull through the vertex without intravenous contrast. Multidetector CT imaging of the head and neck was performed using the standard protocol during bolus administration of intravenous contrast. Multiplanar CT image reconstructions and MIPs were obtained to evaluate the vascular anatomy. Carotid stenosis measurements (when applicable) are obtained utilizing NASCET criteria, using the distal internal carotid diameter as the denominator. RADIATION DOSE REDUCTION: This exam was performed according to the departmental dose-optimization program which includes automated exposure control, adjustment of the mA and/or kV according to patient size and/or use of iterative reconstruction technique. CONTRAST:  25mL ISOVUE-370 IOPAMIDOL (ISOVUE-370) INJECTION 76% COMPARISON:  None Available. FINDINGS: CT HEAD Brain: There is no mass, hemorrhage or extra-axial collection. The size and configuration of the ventricles and extra-axial CSF spaces are normal. There is hypoattenuation of the periventricular white matter, most commonly indicating chronic ischemic microangiopathy. Skull: The visualized skull base, calvarium and extracranial soft tissues are normal. Sinuses/Orbits: No fluid levels or advanced mucosal thickening of the visualized paranasal sinuses. No mastoid or middle ear effusion. The orbits are normal. ASPECTS  [facility] Stroke Program Early CT Score) - Ganglionic level infarction (caudate, lentiform nuclei, internal capsule, insula, M1-M3 cortex): 7 - Supraganglionic  infarction (M4-M6 cortex): 3 Total score (0-10 with 10 being normal): 10 CTA NECK FINDINGS SKELETON: There is no bony spinal canal stenosis. No lytic or blastic lesion. OTHER NECK: Normal pharynx, larynx and major salivary glands. No cervical lymphadenopathy. Unremarkable thyroid gland. UPPER CHEST: No pneumothorax or pleural effusion. No nodules or masses. AORTIC ARCH: There is no calcific atherosclerosis of the aortic arch. There is no aneurysm, dissection or hemodynamically significant stenosis of the visualized portion of the aorta. Conventional 3 vessel aortic branching pattern. The visualized proximal subclavian arteries are widely patent. RIGHT CAROTID SYSTEM: No dissection, occlusion or aneurysm. Mild atherosclerotic calcification at the carotid bifurcation without hemodynamically significant stenosis. LEFT CAROTID SYSTEM: No dissection, occlusion or aneurysm. Mild atherosclerotic calcification at the carotid bifurcation without hemodynamically significant stenosis. VERTEBRAL ARTERIES: Left dominant configuration. Both origins are clearly patent. There is no dissection, occlusion or flow-limiting stenosis to the skull base (V1-V3 segments). CTA HEAD FINDINGS POSTERIOR CIRCULATION: --Vertebral arteries: Bilateral atherosclerotic calcification with moderate right and mild left stenosis. --Inferior cerebellar arteries: Normal. --Basilar artery: Normal. --Superior cerebellar arteries: Normal. --Posterior cerebral arteries (PCA): Normal. ANTERIOR CIRCULATION: --Intracranial internal carotid arteries: Atherosclerotic calcification of the internal carotid arteries at the skull base without hemodynamically significant stenosis. --Anterior cerebral arteries (ACA): Normal. Both A1 segments are present. Patent anterior communicating artery (a-comm). --Middle  cerebral arteries (MCA): Normal. VENOUS SINUSES: As permitted by contrast timing, patent. ANATOMIC VARIANTS: None Review of the MIP images confirms the above findings. IMPRESSION: 1. No intracranial hemorrhage. ASPECTS is 10. 2. No emergent large vessel occlusion. 3. Moderate right and mild left vertebral artery segment V4 stenosis secondary to atherosclerotic calcification. 4. Mild bilateral carotid bifurcation atherosclerosis without hemodynamically significant stenosis. These results were called by telephone at the time of interpretation on 09/05/2022 at 7:15 pm to provider [facility] , who verbally acknowledged these results. Electronically Signed   By: Ulyses Jarred M.D.   On: 09/05/2022 19:22   CT ANGIO HEAD NECK W WO CM (CODE STROKE)  Result Date: 09/05/2022 CLINICAL DATA:  Aphasia EXAM: CT HEAD WITHOUT CONTRAST CT ANGIOGRAPHY OF THE HEAD AND NECK TECHNIQUE: Contiguous axial images were obtained from the base of the skull through the vertex without intravenous contrast. Multidetector CT imaging of the head and neck was performed using the standard protocol during bolus administration of intravenous contrast. Multiplanar CT image reconstructions and MIPs were obtained to evaluate the vascular anatomy. Carotid stenosis measurements (when applicable) are obtained utilizing NASCET criteria, using the distal internal carotid diameter as the denominator. RADIATION DOSE REDUCTION: This exam was performed according to the departmental dose-optimization program which includes automated exposure control, adjustment of the mA and/or kV according to patient size and/or use of iterative reconstruction technique. CONTRAST:  64mL ISOVUE-370 IOPAMIDOL (ISOVUE-370) INJECTION 76% COMPARISON:  None Available. FINDINGS: CT HEAD Brain: There is no mass, hemorrhage or extra-axial collection. The size and configuration of the ventricles and extra-axial CSF spaces are normal. There is hypoattenuation of the periventricular  white matter, most commonly indicating chronic ischemic microangiopathy. Skull: The visualized skull base, calvarium and extracranial soft tissues are normal. Sinuses/Orbits: No fluid levels or advanced mucosal thickening of the visualized paranasal sinuses. No mastoid or middle ear effusion. The orbits are normal. ASPECTS ([place] Stroke Program Early CT Score) - Ganglionic level infarction (caudate, lentiform nuclei, internal capsule, insula, M1-M3 cortex): 7 - Supraganglionic infarction (M4-M6 cortex): 3 Total score (0-10 with 10 being normal): 10 CTA NECK FINDINGS SKELETON: There is no bony spinal canal stenosis. No lytic  or blastic lesion. OTHER NECK: Normal pharynx, larynx and major salivary glands. No cervical lymphadenopathy. Unremarkable thyroid gland. UPPER CHEST: No pneumothorax or pleural effusion. No nodules or masses. AORTIC ARCH: There is no calcific atherosclerosis of the aortic arch. There is no aneurysm, dissection or hemodynamically significant stenosis of the visualized portion of the aorta. Conventional 3 vessel aortic branching pattern. The visualized proximal subclavian arteries are widely patent. RIGHT CAROTID SYSTEM: No dissection, occlusion or aneurysm. Mild atherosclerotic calcification at the carotid bifurcation without hemodynamically significant stenosis. LEFT CAROTID SYSTEM: No dissection, occlusion or aneurysm. Mild atherosclerotic calcification at the carotid bifurcation without hemodynamically significant stenosis. VERTEBRAL ARTERIES: Left dominant configuration. Both origins are clearly patent. There is no dissection, occlusion or flow-limiting stenosis to the skull base (V1-V3 segments). CTA HEAD FINDINGS POSTERIOR CIRCULATION: --Vertebral arteries: Bilateral atherosclerotic calcification with moderate right and mild left stenosis. --Inferior cerebellar arteries: Normal. --Basilar artery: Normal. --Superior cerebellar arteries: Normal. --Posterior cerebral arteries (PCA): Normal.  ANTERIOR CIRCULATION: --Intracranial internal carotid arteries: Atherosclerotic calcification of the internal carotid arteries at the skull base without hemodynamically significant stenosis. --Anterior cerebral arteries (ACA): Normal. Both A1 segments are present. Patent anterior communicating artery (a-comm). --Middle cerebral arteries (MCA): Normal. VENOUS SINUSES: As permitted by contrast timing, patent. ANATOMIC VARIANTS: None Review of the MIP images confirms the above findings. IMPRESSION: 1. No intracranial hemorrhage. ASPECTS is 10. 2. No emergent large vessel occlusion. 3. Moderate right and mild left vertebral artery segment V4 stenosis secondary to atherosclerotic calcification. 4. Mild bilateral carotid bifurcation atherosclerosis without hemodynamically significant stenosis. These results were called by telephone at the time of interpretation on 09/05/2022 at 7:15 pm to provider [facility] , who verbally acknowledged these results. Electronically Signed   By: Ulyses Jarred M.D.   On: 09/05/2022 19:22     PERTINENT LAB RESULTS: CBC: Recent Labs    09/05/22 1837 09/05/22 1842 09/06/22 0436  WBC 12.2*  --  10.2  HGB 11.1* 11.9* 9.8*  HCT 35.7* 35.0* 30.7*  PLT 239  --  184   CMET CMP     Component Value Date/Time   NA 135 09/06/2022 0436   K 3.8 09/06/2022 0436   CL 102 09/06/2022 0436   CO2 24 09/06/2022 0436   GLUCOSE 105 (H) 09/06/2022 0436   BUN 24 (H) 09/06/2022 0436   CREATININE 1.06 (H) 09/06/2022 0436   CALCIUM 8.3 (L) 09/06/2022 0436   PROT 6.4 (L) 09/05/2022 1837   ALBUMIN 3.6 09/05/2022 1837   AST 17 09/05/2022 1837   ALT 15 09/05/2022 1837   ALKPHOS 55 09/05/2022 1837   BILITOT 0.3 09/05/2022 1837   GFRNONAA 51 (L) 09/06/2022 0436   GFRAA 50 (L) 02/16/2020 1714    GFR CrCl cannot be calculated (Unknown ideal weight.). No results for input(s): "LIPASE", "AMYLASE" in the last 72 hours. No results for input(s): "CKTOTAL", "CKMB", "CKMBINDEX",  "TROPONINI" in the last 72 hours. Invalid input(s): "POCBNP" No results for input(s): "DDIMER" in the last 72 hours. No results for input(s): "HGBA1C" in the last 72 hours. Recent Labs    09/06/22 0436  CHOL 123  HDL 63  LDLCALC 49  TRIG 54  CHOLHDL 2.0   Recent Labs    09/05/22 2254  TSH 2.031   Recent Labs    09/05/22 2254  VITAMINB12 786  FOLATE 8.0   Coags: Recent Labs    09/05/22 1837  INR 1.3*   Microbiology: Recent Results (from the past 240 hour(s))  Resp panel by RT-PCR (RSV,  Flu A&B, Covid) Anterior Nasal Swab     Status: None   Collection Time: 09/05/22 11:27 PM   Specimen: Anterior Nasal Swab  Result Value Ref Range Status   SARS Coronavirus 2 by RT PCR NEGATIVE NEGATIVE Final   Influenza A by PCR NEGATIVE NEGATIVE Final   Influenza B by PCR NEGATIVE NEGATIVE Final    Comment: (NOTE) The Xpert Xpress SARS-CoV-2/FLU/RSV plus assay is intended as an aid in the diagnosis of influenza from Nasopharyngeal swab specimens and should not be used as a sole basis for treatment. Nasal washings and aspirates are unacceptable for Xpert Xpress SARS-CoV-2/FLU/RSV testing.  Fact Sheet for Patients: EntrepreneurPulse.com.au  Fact Sheet for Healthcare Providers: IncredibleEmployment.be  This test is not yet approved or cleared by the [place] FDA and has been authorized for detection and/or diagnosis of SARS-CoV-2 by FDA under an Emergency Use Authorization (EUA). This EUA will remain in effect (meaning this test can be used) for the duration of the COVID-19 declaration under Section 564(b)(1) of the Act, 21 U.S.C. section 360bbb-3(b)(1), unless the authorization is terminated or revoked.     Resp Syncytial Virus by PCR NEGATIVE NEGATIVE Final    Comment: (NOTE) Fact Sheet for Patients: EntrepreneurPulse.com.au  Fact Sheet for Healthcare Providers: IncredibleEmployment.be  This  test is not yet approved or cleared by the [place] FDA and has been authorized for detection and/or diagnosis of SARS-CoV-2 by FDA under an Emergency Use Authorization (EUA). This EUA will remain in effect (meaning this test can be used) for the duration of the COVID-19 declaration under Section 564(b)(1) of the Act, 21 U.S.C. section 360bbb-3(b)(1), unless the authorization is terminated or revoked.  Performed at [facility], [place] [address] [place], [place] [address]     FURTHER DISCHARGE INSTRUCTIONS:  Get Medicines reviewed and adjusted: Please take all your medications with you for your next visit with your Primary MD  Laboratory/radiological data: Please request your Primary MD to go over all hospital tests and procedure/radiological results at the follow up, please ask your Primary MD to get all Hospital records sent to his/her office.  In some cases, they will be blood work, cultures and biopsy results pending at the time of your discharge. Please request that your primary care M.D. goes through all the records of your hospital data and follows up on these results.  Also Note the following: If you experience worsening of your admission symptoms, develop shortness of breath, life threatening emergency, suicidal or homicidal thoughts you must seek medical attention immediately by calling 911 or calling your MD immediately  if symptoms less severe.  You must read complete instructions/literature along with all the possible adverse reactions/side effects for all the Medicines you take and that have been prescribed to you. Take any new Medicines after you have completely understood and accpet all the possible adverse reactions/side effects.   Do not drive when taking Pain medications or sleeping medications (Benzodaizepines)  Do not take more than prescribed Pain, Sleep and Anxiety Medications. It is not advisable to combine anxiety,sleep and pain medications without  talking with your primary care practitioner  Special Instructions: If you have smoked or chewed Tobacco  in the last 2 yrs please stop smoking, stop any regular Alcohol  and or any Recreational drug use.  Wear Seat belts while driving.  Please note: You were cared for by a hospitalist during your hospital stay. Once you are discharged, your primary care physician will handle any further medical issues. Please  note that NO REFILLS for any discharge medications will be authorized once you are discharged, as it is imperative that you return to your primary care physician (or establish a relationship with a primary care physician if you do not have one) for your post hospital discharge needs so that they can reassess your need for medications and monitor your lab values.  Total Time spent coordinating discharge including counseling, education and face to face time equals greater than 30 minutes.  SignedOren Binet 09/06/2022 12:43 PM

## 2022-09-06 NOTE — Progress Notes (Signed)
EEG complete - results pending 

## 2022-09-06 NOTE — ED Notes (Signed)
Pt report received from previous nurse. Pt A&O x2, vitals stable, denies needs/complaints. Call bell in reach. No acute distress noted.

## 2022-09-06 NOTE — Evaluation (Signed)
Occupational Therapy Evaluation Patient Details Name: [name] MRN: [id number] DOB: 1936-01-03 Today's Date: 09/06/2022   History of Present Illness [name] is an 87 y.o. female who presented with aphasia, left sided weakness and slurred speech. Stroke workup negative for acute findings. PMHx: Alzheimer's dementia with behavioral disturbance, prior stroke without residual deficit, PE/DVT on Eliquis, HTN, HLD, hypothyroidism, depression/anxiety   Clinical Impression   [name] was evaluated s/p the above admission list. She lives with family who assist with IADLs and ADLs as needed and she ambulates with a RW at baseline. Upon evaluation she was limited by baseline cognitive deficits, decreased activity tolerance, R mild R inattention and generalized weakness. Overall she required superivsion- min guard for all mobility and Adls with RW. Pt will benefit from continued acute OT services. Recommend d/c to home with continued support of family.       Recommendations for follow up therapy are one component of a multi-disciplinary discharge planning process, led by the attending physician.  Recommendations may be updated based on patient status, additional functional criteria and insurance authorization.   Follow Up Recommendations  No OT follow up     Assistance Recommended at Discharge Frequent or constant Supervision/Assistance  Patient can return home with the following A little help with walking and/or transfers;A little help with bathing/dressing/bathroom;Assistance with cooking/housework;Assistance with feeding;Direct supervision/assist for medications management;Direct supervision/assist for financial management;Assist for transportation;Help with stairs or ramp for entrance    Functional Status Assessment  Patient has had a recent decline in their functional status and demonstrates the ability to make significant improvements in function in a reasonable and predictable amount of  time.  Equipment Recommendations  None recommended by OT       Precautions / Restrictions Precautions Precautions: Fall Restrictions Weight Bearing Restrictions: No      Mobility Bed Mobility Overal bed mobility: Needs Assistance Bed Mobility: Supine to Sit     Supine to sit: Supervision          Transfers Overall transfer level: Needs assistance Equipment used: Rolling walker (2 wheels) Transfers: Sit to/from Stand Sit to Stand: Min guard                  Balance Overall balance assessment: Needs assistance Sitting-balance support: Feet supported Sitting balance-Leahy Scale: Good     Standing balance support: Bilateral upper extremity supported, During functional activity Standing balance-Leahy Scale: Poor                             ADL either performed or assessed with clinical judgement   ADL Overall ADL's : Needs assistance/impaired Eating/Feeding: Set up;Sitting   Grooming: Set up;Sitting   Upper Body Bathing: Supervision/ safety;Sitting   Lower Body Bathing: Min guard;Sit to/from stand   Upper Body Dressing : Supervision/safety;Sitting   Lower Body Dressing: Min guard;Sit to/from stand   Toilet Transfer: Min guard;Ambulation;Rolling walker (2 wheels)   Toileting- Clothing Manipulation and Hygiene: Supervision/safety;Sitting/lateral lean       Functional mobility during ADLs: Rolling walker (2 wheels);Min guard General ADL Comments: R inattention noted throughout. superivsion A and verbal cues needed for baseline cognitive deficits     Vision Baseline Vision/History: 0 No visual deficits Vision Assessment?: No apparent visual deficits     Perception Perception Perception Tested?: No   Praxis Praxis Praxis tested?: Not tested    Pertinent Vitals/Pain Pain Assessment Pain Assessment: No/denies pain     Hand Dominance Right  Extremity/Trunk Assessment Upper Extremity Assessment Upper Extremity Assessment:  Overall WFL for tasks assessed   Lower Extremity Assessment Lower Extremity Assessment: Defer to PT evaluation   Cervical / Trunk Assessment Cervical / Trunk Assessment: Normal   Communication Communication Communication: No difficulties   Cognition Arousal/Alertness: Awake/alert Behavior During Therapy: WFL for tasks assessed/performed Overall Cognitive Status: History of cognitive impairments - at baseline                                 General Comments: Oriented to person and place. Followed simple one step directions, difficulty wtih delayed recall, problem solving and attention. R inattention noted throughout,.     General Comments  VSS on RA, no family present     Home Living Family/patient expects to be discharged to:: Private residence Living Arrangements: Other relatives;Children (granddaughter and daughter) Available Help at Discharge: Family;Available PRN/intermittently Type of Home: House Home Access: Level entry   Entrance Stairs-Rails: None Home Layout: One level     Bathroom Shower/Tub: Occupational psychologist: Handicapped height     Home Equipment: Conservation officer, nature (2 wheels);Cane - single point   Additional Comments: information gleaned from chart; pt report gave conflicting information      Prior Functioning/Environment Prior Level of Function : Needs assist             Mobility Comments: Uses RW for ambulation ADLs Comments: assist with bathing, able to perform own ADLs        OT Problem List: Impaired balance (sitting and/or standing);Decreased activity tolerance;Decreased cognition;Decreased safety awareness      OT Treatment/Interventions: Self-care/ADL training;DME and/or AE instruction;Therapeutic activities;Balance training;Patient/family education    OT Goals(Current goals can be found in the care plan section) Acute Rehab OT Goals Patient Stated Goal: did not state OT Goal Formulation: With patient Time  For Goal Achievement: 09/20/22 Potential to Achieve Goals: Good ADL Goals Pt Will Perform Grooming: with modified independence;standing Pt Will Perform Lower Body Dressing: with modified independence;sit to/from stand Pt Will Transfer to Toilet: with modified independence;ambulating  OT Frequency: Min 2X/week       AM-PAC OT "6 Clicks" Daily Activity     Outcome Measure Help from another person eating meals?: A Little Help from another person taking care of personal grooming?: A Little Help from another person toileting, which includes using toliet, bedpan, or urinal?: A Little Help from another person bathing (including washing, rinsing, drying)?: A Little Help from another person to put on and taking off regular upper body clothing?: A Little Help from another person to put on and taking off regular lower body clothing?: A Little 6 Click Score: 18   End of Session Equipment Utilized During Treatment: Rolling walker (2 wheels) Nurse Communication: Mobility status  Activity Tolerance: Patient tolerated treatment well Patient left: in chair;with call bell/phone within reach;with chair alarm set  OT Visit Diagnosis: Other abnormalities of gait and mobility (R26.89)                Time: [id number] OT Time Calculation (min): 20 min Charges:  OT General Charges $OT Visit: 1 Visit OT Evaluation $OT Eval Moderate Complexity: 1 Mod  Shade Flood, OTR/L [place] Office [phone] Secure Chat Communication Preferred   Elliot Cousin 09/06/2022, 9:00 AM

## 2022-09-06 NOTE — Plan of Care (Signed)
  Problem: Acute Rehab OT Goals (only OT should resolve) Goal: Pt. Will Perform Grooming Outcome: Adequate for Discharge Goal: Pt. Will Perform Lower Body Dressing Outcome: Adequate for Discharge Goal: Pt. Will Transfer To Toilet Outcome: Adequate for Discharge   Problem: Acute Rehab PT Goals(only PT should resolve) Goal: Pt Will Go Supine/Side To Sit Outcome: Adequate for Discharge Goal: Pt Will Go Sit To Supine/Side Outcome: Adequate for Discharge Goal: Patient Will Transfer Sit To/From Stand Outcome: Adequate for Discharge Goal: Pt Will Ambulate Outcome: Adequate for Discharge

## 2022-09-06 NOTE — Procedures (Signed)
Patient Name: [name]  MRN: [id number]  Epilepsy Attending: Lora Havens  Referring Physician/Provider: Donnetta Simpers, MD  Date: 09/06/2022 Duration: 23.32 mins  Patient history: 87yo F with ams. EEG to evaluate for seizure  Level of alertness: Awake  AEDs during EEG study: GBP  Technical aspects: This EEG study was done with scalp electrodes positioned according to the 10-20 International system of electrode placement. Electrical activity was reviewed with band pass filter of 1-70Hz , sensitivity of 7 uV/mm, display speed of 80mm/sec with a 60Hz  notched filter applied as appropriate. EEG data were recorded continuously and digitally stored.  Video monitoring was available and reviewed as appropriate.  Description: The posterior dominant rhythm consists of 8 Hz activity of moderate voltage (25-35 uV) seen predominantly in posterior head regions, symmetric and reactive to eye opening and eye closing.Photic driving was not seen during photic stimulation. Hyperventilation was not performed.     IMPRESSION: This study is within normal limits. No seizures or epileptiform discharges were seen throughout the recording.  A normal interictal EEG does not exclude the diagnosis of epilepsy.  Haille Pardi Barbra Sarks

## 2022-09-06 NOTE — Progress Notes (Signed)
STROKE TEAM PROGRESS NOTE   SUBJECTIVE (INTERVAL HISTORY) Her granddaughter is at the bedside. Per granddaughter, pt has memory issue at home but able to carry normal conversation. However, yesterday, after lunch, she was bending over in couch and leaning towards left for a prolonged time. When they tried to talk her, her speech did not make sense. EMS called and she was sent to ED for evaluation. So far work up negative but she did have some low grade fever yesterday evening but resolved over night. Today, she is awake alert pleasant and able to carry very short limited conversation but when come down to more complicated sentences or meaningful sentences, she started to have word salad. MRI negative for stroke and EEG pending.    OBJECTIVE Temp:  [97.7 F (36.5 C)-99.6 F (37.6 C)] 98.4 F (36.9 C) (03/18 0800) Pulse Rate:  [64-89] 64 (03/18 0800) Cardiac Rhythm: Normal sinus rhythm (03/18 0845) Resp:  [13-18] 14 (03/18 0800) BP: (94-136)/(48-56) 123/53 (03/18 0800) SpO2:  [93 %-97 %] 96 % (03/18 0800) Weight:  [71.4 kg] 71.4 kg (03/17 1842)  Recent Labs  Lab 09/05/22 1840  GLUCAP 128*   Recent Labs  Lab 09/05/22 1837 09/05/22 1842 09/06/22 0436  NA 136 138 135  K 4.8 4.9 3.8  CL 104 104 102  CO2 22  --  24  GLUCOSE 130* 131* 105*  BUN 29* 32* 24*  CREATININE 1.19* 1.30* 1.06*  CALCIUM 8.9  --  8.3*   Recent Labs  Lab 09/05/22 1837  AST 17  ALT 15  ALKPHOS 55  BILITOT 0.3  PROT 6.4*  ALBUMIN 3.6   Recent Labs  Lab 09/05/22 1837 09/05/22 1842 09/06/22 0436  WBC 12.2*  --  10.2  NEUTROABS 11.1*  --   --   HGB 11.1* 11.9* 9.8*  HCT 35.7* 35.0* 30.7*  MCV 86.4  --  85.3  PLT 239  --  184   No results for input(s): "CKTOTAL", "CKMB", "CKMBINDEX", "TROPONINI" in the last 168 hours. Recent Labs    09/05/22 1837  LABPROT 16.2*  INR 1.3*   Recent Labs    09/05/22 1932  COLORURINE YELLOW  LABSPEC 1.029  PHURINE 5.0  GLUCOSEU NEGATIVE  HGBUR NEGATIVE   BILIRUBINUR NEGATIVE  KETONESUR NEGATIVE  PROTEINUR NEGATIVE  NITRITE NEGATIVE  LEUKOCYTESUR NEGATIVE       Component Value Date/Time   CHOL 123 09/06/2022 0436   TRIG 54 09/06/2022 0436   HDL 63 09/06/2022 0436   CHOLHDL 2.0 09/06/2022 0436   VLDL 11 09/06/2022 0436   LDLCALC 49 09/06/2022 0436   Lab Results  Component Value Date   HGBA1C 5.7 (H) 10/16/2020      Component Value Date/Time   LABOPIA NONE DETECTED 01/04/2020 2018   COCAINSCRNUR NONE DETECTED 01/04/2020 2018   LABBENZ NONE DETECTED 01/04/2020 2018   AMPHETMU NONE DETECTED 01/04/2020 2018   THCU NONE DETECTED 01/04/2020 2018   LABBARB NONE DETECTED 01/04/2020 2018    Recent Labs  Lab 09/05/22 1837  ETH <10    I have personally reviewed the radiological images below and agree with the radiology interpretations.  MR Brain Wo Contrast (neuro protocol)  Result Date: 09/05/2022 CLINICAL DATA:  Acute neurologic deficit EXAM: MRI HEAD WITHOUT CONTRAST TECHNIQUE: Multiplanar, multiecho pulse sequences of the brain and surrounding structures were obtained without intravenous contrast. COMPARISON:  None Available. FINDINGS: Brain: No acute infarct, mass effect or extra-axial collection. Chronic microhemorrhages in the left temporal and occipital lobes. There is  multifocal hyperintense T2-weighted signal within the white matter. Parenchymal volume and CSF spaces are normal. The midline structures are normal. Vascular: Normal flow voids. Skull and upper cervical spine: Normal marrow signal. Sinuses/Orbits: Negative. Other: None. IMPRESSION: 1. No acute intracranial abnormality. 2. Findings of chronic small vessel ischemia. Electronically Signed   By: Ulyses Jarred M.D.   On: 09/05/2022 21:48   DG Chest 2 View  Result Date: 09/05/2022 CLINICAL DATA:  Possible infection EXAM: CHEST - 2 VIEW COMPARISON:  09/07/2021 FINDINGS: Heart borderline in size. Mild hyperinflation. No confluent airspace opacity or effusion. No acute  bony abnormality. IMPRESSION: No active cardiopulmonary disease. Electronically Signed   By: Rolm Baptise M.D.   On: 09/05/2022 20:12   CT HEAD CODE STROKE WO CONTRAST  Result Date: 09/05/2022 CLINICAL DATA:  Aphasia EXAM: CT HEAD WITHOUT CONTRAST CT ANGIOGRAPHY OF THE HEAD AND NECK TECHNIQUE: Contiguous axial images were obtained from the base of the skull through the vertex without intravenous contrast. Multidetector CT imaging of the head and neck was performed using the standard protocol during bolus administration of intravenous contrast. Multiplanar CT image reconstructions and MIPs were obtained to evaluate the vascular anatomy. Carotid stenosis measurements (when applicable) are obtained utilizing NASCET criteria, using the distal internal carotid diameter as the denominator. RADIATION DOSE REDUCTION: This exam was performed according to the departmental dose-optimization program which includes automated exposure control, adjustment of the mA and/or kV according to patient size and/or use of iterative reconstruction technique. CONTRAST:  4mL ISOVUE-370 IOPAMIDOL (ISOVUE-370) INJECTION 76% COMPARISON:  None Available. FINDINGS: CT HEAD Brain: There is no mass, hemorrhage or extra-axial collection. The size and configuration of the ventricles and extra-axial CSF spaces are normal. There is hypoattenuation of the periventricular white matter, most commonly indicating chronic ischemic microangiopathy. Skull: The visualized skull base, calvarium and extracranial soft tissues are normal. Sinuses/Orbits: No fluid levels or advanced mucosal thickening of the visualized paranasal sinuses. No mastoid or middle ear effusion. The orbits are normal. ASPECTS ([place] Stroke Program Early CT Score) - Ganglionic level infarction (caudate, lentiform nuclei, internal capsule, insula, M1-M3 cortex): 7 - Supraganglionic infarction (M4-M6 cortex): 3 Total score (0-10 with 10 being normal): 10 CTA NECK FINDINGS SKELETON:  There is no bony spinal canal stenosis. No lytic or blastic lesion. OTHER NECK: Normal pharynx, larynx and major salivary glands. No cervical lymphadenopathy. Unremarkable thyroid gland. UPPER CHEST: No pneumothorax or pleural effusion. No nodules or masses. AORTIC ARCH: There is no calcific atherosclerosis of the aortic arch. There is no aneurysm, dissection or hemodynamically significant stenosis of the visualized portion of the aorta. Conventional 3 vessel aortic branching pattern. The visualized proximal subclavian arteries are widely patent. RIGHT CAROTID SYSTEM: No dissection, occlusion or aneurysm. Mild atherosclerotic calcification at the carotid bifurcation without hemodynamically significant stenosis. LEFT CAROTID SYSTEM: No dissection, occlusion or aneurysm. Mild atherosclerotic calcification at the carotid bifurcation without hemodynamically significant stenosis. VERTEBRAL ARTERIES: Left dominant configuration. Both origins are clearly patent. There is no dissection, occlusion or flow-limiting stenosis to the skull base (V1-V3 segments). CTA HEAD FINDINGS POSTERIOR CIRCULATION: --Vertebral arteries: Bilateral atherosclerotic calcification with moderate right and mild left stenosis. --Inferior cerebellar arteries: Normal. --Basilar artery: Normal. --Superior cerebellar arteries: Normal. --Posterior cerebral arteries (PCA): Normal. ANTERIOR CIRCULATION: --Intracranial internal carotid arteries: Atherosclerotic calcification of the internal carotid arteries at the skull base without hemodynamically significant stenosis. --Anterior cerebral arteries (ACA): Normal. Both A1 segments are present. Patent anterior communicating artery (a-comm). --Middle cerebral arteries (MCA): Normal. VENOUS SINUSES: As permitted by  contrast timing, patent. ANATOMIC VARIANTS: None Review of the MIP images confirms the above findings. IMPRESSION: 1. No intracranial hemorrhage. ASPECTS is 10. 2. No emergent large vessel occlusion.  3. Moderate right and mild left vertebral artery segment V4 stenosis secondary to atherosclerotic calcification. 4. Mild bilateral carotid bifurcation atherosclerosis without hemodynamically significant stenosis. These results were called by telephone at the time of interpretation on 09/05/2022 at 7:15 pm to provider [facility] , who verbally acknowledged these results. Electronically Signed   By: Ulyses Jarred M.D.   On: 09/05/2022 19:22   CT ANGIO HEAD NECK W WO CM (CODE STROKE)  Result Date: 09/05/2022 CLINICAL DATA:  Aphasia EXAM: CT HEAD WITHOUT CONTRAST CT ANGIOGRAPHY OF THE HEAD AND NECK TECHNIQUE: Contiguous axial images were obtained from the base of the skull through the vertex without intravenous contrast. Multidetector CT imaging of the head and neck was performed using the standard protocol during bolus administration of intravenous contrast. Multiplanar CT image reconstructions and MIPs were obtained to evaluate the vascular anatomy. Carotid stenosis measurements (when applicable) are obtained utilizing NASCET criteria, using the distal internal carotid diameter as the denominator. RADIATION DOSE REDUCTION: This exam was performed according to the departmental dose-optimization program which includes automated exposure control, adjustment of the mA and/or kV according to patient size and/or use of iterative reconstruction technique. CONTRAST:  61mL ISOVUE-370 IOPAMIDOL (ISOVUE-370) INJECTION 76% COMPARISON:  None Available. FINDINGS: CT HEAD Brain: There is no mass, hemorrhage or extra-axial collection. The size and configuration of the ventricles and extra-axial CSF spaces are normal. There is hypoattenuation of the periventricular white matter, most commonly indicating chronic ischemic microangiopathy. Skull: The visualized skull base, calvarium and extracranial soft tissues are normal. Sinuses/Orbits: No fluid levels or advanced mucosal thickening of the visualized paranasal sinuses. No  mastoid or middle ear effusion. The orbits are normal. ASPECTS ([place] Stroke Program Early CT Score) - Ganglionic level infarction (caudate, lentiform nuclei, internal capsule, insula, M1-M3 cortex): 7 - Supraganglionic infarction (M4-M6 cortex): 3 Total score (0-10 with 10 being normal): 10 CTA NECK FINDINGS SKELETON: There is no bony spinal canal stenosis. No lytic or blastic lesion. OTHER NECK: Normal pharynx, larynx and major salivary glands. No cervical lymphadenopathy. Unremarkable thyroid gland. UPPER CHEST: No pneumothorax or pleural effusion. No nodules or masses. AORTIC ARCH: There is no calcific atherosclerosis of the aortic arch. There is no aneurysm, dissection or hemodynamically significant stenosis of the visualized portion of the aorta. Conventional 3 vessel aortic branching pattern. The visualized proximal subclavian arteries are widely patent. RIGHT CAROTID SYSTEM: No dissection, occlusion or aneurysm. Mild atherosclerotic calcification at the carotid bifurcation without hemodynamically significant stenosis. LEFT CAROTID SYSTEM: No dissection, occlusion or aneurysm. Mild atherosclerotic calcification at the carotid bifurcation without hemodynamically significant stenosis. VERTEBRAL ARTERIES: Left dominant configuration. Both origins are clearly patent. There is no dissection, occlusion or flow-limiting stenosis to the skull base (V1-V3 segments). CTA HEAD FINDINGS POSTERIOR CIRCULATION: --Vertebral arteries: Bilateral atherosclerotic calcification with moderate right and mild left stenosis. --Inferior cerebellar arteries: Normal. --Basilar artery: Normal. --Superior cerebellar arteries: Normal. --Posterior cerebral arteries (PCA): Normal. ANTERIOR CIRCULATION: --Intracranial internal carotid arteries: Atherosclerotic calcification of the internal carotid arteries at the skull base without hemodynamically significant stenosis. --Anterior cerebral arteries (ACA): Normal. Both A1 segments are  present. Patent anterior communicating artery (a-comm). --Middle cerebral arteries (MCA): Normal. VENOUS SINUSES: As permitted by contrast timing, patent. ANATOMIC VARIANTS: None Review of the MIP images confirms the above findings. IMPRESSION: 1. No intracranial hemorrhage. ASPECTS is 10. 2.  No emergent large vessel occlusion. 3. Moderate right and mild left vertebral artery segment V4 stenosis secondary to atherosclerotic calcification. 4. Mild bilateral carotid bifurcation atherosclerosis without hemodynamically significant stenosis. These results were called by telephone at the time of interpretation on 09/05/2022 at 7:15 pm to provider [facility] , who verbally acknowledged these results. Electronically Signed   By: Ulyses Jarred M.D.   On: 09/05/2022 19:22     PHYSICAL EXAM  Temp:  [97.7 F (36.5 C)-99.6 F (37.6 C)] 98.4 F (36.9 C) (03/18 0800) Pulse Rate:  [64-89] 64 (03/18 0800) Resp:  [13-18] 14 (03/18 0800) BP: (94-136)/(48-56) 123/53 (03/18 0800) SpO2:  [93 %-97 %] 96 % (03/18 0800) Weight:  [71.4 kg] 71.4 kg (03/17 1842)  General - Well nourished, well developed, in no apparent distress.  Ophthalmologic - fundi not visualized due to noncooperation.  Cardiovascular - Regular rhythm and rate.  Neuro - awake, alert, eyes open, orientated to her first name only, incorrect on other orientation questions. No aphasia, fluent language but lack of content with word salad for meaningful sentences but fluent with common short words, following most simple commands. Able to name "nose" and "ear" but not "thumb". No gaze palsy, tracking bilaterally, blinking to visual threat bilaterally. Right mild nasolabial fold flattening but chronic per granddaughter. Tongue midline. Bilateral UEs 4/5, no drift. Bilaterally LEs 3/5, no drift. Sensation symmetrical bilaterally, b/l FTN intact grossly, gait not tested.     ASSESSMENT/PLAN [name] is a 87 y.o. female with history of HTN,  dementia, stroke without residue, b/l PE in 08/2021 on eliquis admitted for AMS, nonsensical speech, bending over for prolonged time with left leaning. No tPA given due to outside window.    Encephalopathy in the setting of low grade fever and advance dementia CT no acute abnormalities CTA head and neck right V4 moderate stenosis, bilateral ICA bulb atherosclerosis MRI  no acute infarct EEG normal limits LDL 49 HgbA1c pending Ammonia, B12, TSH all WNL UA neg SCDs for VTE prophylaxis Eliquis (apixaban) daily prior to admission, now on Eliquis (apixaban) daily.  Patient and family counseled to be compliant with her antithrombotic medications Ongoing aggressive stroke risk factor management Therapy recommendations:  none Disposition:  home today likely  Advanced dementia Follows with Dr. Delice Lesch at [place] with behavior disturbance On nemanda, zyprexa, zoloft and trazadone Seems like continues to progress now with more impaired speech, anomia Follow up with Dr. Delice Lesch  Hypertension Stable Long term BP goal normotensive  Hyperlipidemia Home meds:  zocor 20  LDL 49, goal < 70 Now on zocor 20 Continue statin at discharge  Other Stroke Risk Factors Advanced age Hx stroke without residue Hx of b/l PE on eliquis  Other Active Problems Low grade fever, Tmax 37.6, resolved over night. No source found so far  Hospital day # 0  Neurology will sign off. Please call with questions. Pt will follow up with Dr. Delice Lesch at [facility] on 11/02/22. Thanks for the consult.   Rosalin Hawking, MD PhD Stroke Neurology 09/06/2022 11:37 AM    To contact Stroke Continuity provider, please refer to [email or web address] After hours, contact General Neurology

## 2022-09-06 NOTE — Evaluation (Signed)
Physical Therapy Evaluation Patient Details Name: [name] MRN: [id number] DOB: [birth year] Today's Date: 09/06/2022  History of Present Illness  [name] is an 87 y.o. female who presented with aphasia, left sided weakness and slurred speech. Stroke workup negative for acute findings. PMHx: Alzheimer's dementia with behavioral disturbance, prior stroke without residual deficit, PE/DVT on Eliquis, HTN, HLD, hypothyroidism, depression/anxiety  Clinical Impression  Patient presents with possible new language deficits?, generalized weakness, baseline cognitive deficits,  mild Rt inattention, impaired balance and impaired mobility s/p above. Pt lives at home with her daughter and granddaughter and reports using RW for ambulation PTA. Pt reports independent with ADLs but needs assist for getting in/out of shower and supervision per note. Today, pt requires Min A for standing and Min guard assist for ambulation with use of RW for support. Noted to veer right throughout gait bumping into things on right side x4. No family present so history is taken partly from last admission and per pt report this admission. Likely close to baseline? Will follow acutely to maximize independence and mobility prior to return home.     Recommendations for follow up therapy are one component of a multi-disciplinary discharge planning process, led by the attending physician.  Recommendations may be updated based on patient status, additional functional criteria and insurance authorization.  Follow Up Recommendations No PT follow up      Assistance Recommended at Discharge Intermittent Supervision/Assistance  Patient can return home with the following  A little help with walking and/or transfers;A little help with bathing/dressing/bathroom;Help with stairs or ramp for entrance;Assist for transportation;Assistance with cooking/housework;Direct supervision/assist for medications management;Direct supervision/assist for  financial management    Equipment Recommendations None recommended by PT  Recommendations for Other Services       Functional Status Assessment Patient has had a recent decline in their functional status and demonstrates the ability to make significant improvements in function in a reasonable and predictable amount of time.     Precautions / Restrictions Precautions Precautions: Fall Restrictions Weight Bearing Restrictions: No      Mobility  Bed Mobility Overal bed mobility: Needs Assistance Bed Mobility: Supine to Sit     Supine to sit: Supervision, HOB elevated     General bed mobility comments: SUpervision for safety. Increased time.    Transfers Overall transfer level: Needs assistance Equipment used: Rolling walker (2 wheels) Transfers: Sit to/from Stand Sit to Stand: Min assist           General transfer comment: Min A to power to standing from EOB x1. Transferred to chair post ambulation.    Ambulation/Gait Ambulation/Gait assistance: Min guard Gait Distance (Feet): 150 Feet Assistive device: Rolling walker (2 wheels) Gait Pattern/deviations: Step-through pattern, Decreased stride length, Staggering right Gait velocity: decreased Gait velocity interpretation: 1.31 - 2.62 ft/sec, indicative of limited community ambulator   General Gait Details: Slow, mostly steady gait with veering towards right, bumping into things on right x3.  Stairs            Wheelchair Mobility    Modified Rankin (Stroke Patients Only) Modified Rankin (Stroke Patients Only) Pre-Morbid Rankin Score: Moderately severe disability Modified Rankin: Moderately severe disability     Balance Overall balance assessment: Needs assistance Sitting-balance support: Feet supported, No upper extremity supported Sitting balance-Leahy Scale: Good     Standing balance support: During functional activity, Bilateral upper extremity supported Standing balance-Leahy Scale:  Poor Standing balance comment: Use of RW  Pertinent Vitals/Pain Pain Assessment Pain Assessment: No/denies pain    Home Living Family/patient expects to be discharged to:: Private residence Living Arrangements: Other relatives;Children (daughter/granddaugter) Available Help at Discharge: Family;Available PRN/intermittently Type of Home: House Home Access: Level entry Entrance Stairs-Rails: None     Home Layout: One level Home Equipment: Conservation officer, nature (2 wheels);Cane - single point Additional Comments: information gleaned from chart; pt report gave conflicting information    Prior Function Prior Level of Function : Needs assist             Mobility Comments: Uses RW for ambulation ADLs Comments: assist with bathing, able to perform own ADLs     Hand Dominance   Dominant Hand: Right    Extremity/Trunk Assessment   Upper Extremity Assessment Upper Extremity Assessment: Defer to OT evaluation    Lower Extremity Assessment Lower Extremity Assessment: Generalized weakness (but functional)    Cervical / Trunk Assessment Cervical / Trunk Assessment: Normal  Communication   Communication: Expressive difficulties (aphasia?)  Cognition Arousal/Alertness: Awake/alert Behavior During Therapy: WFL for tasks assessed/performed Overall Cognitive Status: History of cognitive impairments - at baseline                                 General Comments: Oriented to person and place. Followed simple one step directions, difficulty wtih delayed recall, problem solving and attention. R inattention noted throughout,.        General Comments General comments (skin integrity, edema, etc.): VSS on RA, no family present.    Exercises     Assessment/Plan    PT Assessment Patient needs continued PT services  PT Problem List Decreased mobility;Decreased safety awareness;Decreased balance;Decreased cognition       PT  Treatment Interventions Therapeutic activities;Gait training;Therapeutic exercise;Patient/family education;Balance training;Functional mobility training;Neuromuscular re-education;DME instruction    PT Goals (Current goals can be found in the Care Plan section)  Acute Rehab PT Goals Patient Stated Goal: to go home PT Goal Formulation: With patient Time For Goal Achievement: 09/20/22 Potential to Achieve Goals: Good    Frequency Min 4X/week     Co-evaluation               AM-PAC PT "6 Clicks" Mobility  Outcome Measure Help needed turning from your back to your side while in a flat bed without using bedrails?: A Little Help needed moving from lying on your back to sitting on the side of a flat bed without using bedrails?: A Little Help needed moving to and from a bed to a chair (including a wheelchair)?: A Little Help needed standing up from a chair using your arms (e.g., wheelchair or bedside chair)?: A Little Help needed to walk in hospital room?: A Little Help needed climbing 3-5 steps with a railing? : A Little 6 Click Score: 18    End of Session Equipment Utilized During Treatment: Gait belt Activity Tolerance: Patient tolerated treatment well Patient left: in chair;with call bell/phone within reach;with chair alarm set Nurse Communication: Mobility status PT Visit Diagnosis: Difficulty in walking, not elsewhere classified (R26.2);Unsteadiness on feet (R26.81)    Time: [id number] PT Time Calculation (min) (ACUTE ONLY): 20 min   Charges:   PT Evaluation $PT Eval Moderate Complexity: 1 Mod          Marisa Severin, PT, DPT Acute Rehabilitation Services Secure chat preferred Office [place] 09/06/2022, 9:41 AM

## 2022-09-06 NOTE — ED Notes (Signed)
ED TO INPATIENT HANDOFF REPORT  ED Nurse Name and Phone #: [phone]  S Name/Age/Gender [name] 87 y.o. female Room/Bed: 041C/041C  Code Status   Code Status: Full Code  Home/SNF/Other Skilled nursing facility Patient oriented to: self and place Is this baseline? Yes   Triage Complete: Triage complete  Chief Complaint Expressive aphasia [R47.01]  Triage Note Pt arrived via [facility] from home as a code stroke. Pt LKW 1300 today per family. Per EMS, pt has aphasia, left sided weakness and slurred speech. Pt takes eliquis. Per EMS, pt is A&Ox4, but does have a hx of dementia and sundowns.   Allergies Allergies  Allergen Reactions   Donepezil Other (See Comments)    POSSIBLE (??) Hallucinations and sleep disturbances   Escitalopram Oxalate Other (See Comments)    UNK reaction   Hydrochlorothiazide Other (See Comments)    UNK reaction    Level of Care/Admitting Diagnosis ED Disposition     ED Disposition  Admit   Condition  --   Comment  Hospital Area: [facility] [[id number]]  Level of Care: Telemetry Medical [104]  May place patient in observation at [facility] or [facility] if equivalent level of care is available:: No  Covid Evaluation: Asymptomatic - no recent exposure (last 10 days) testing not required  Diagnosis: Expressive aphasia [637005]  Admitting Physician: Lenore Cordia [id number]  Attending Physician: Lenore Cordia [id number]          B Medical/Surgery History Past Medical History:  Diagnosis Date   Anxiety    Arthritis    Depression    Hypertension    Pre-diabetes    Stroke ([place])    6 years ago   Thyroid disease    Past Surgical History:  Procedure Laterality Date   ABDOMINAL HYSTERECTOMY     CHOLECYSTECTOMY     MULTIPLE TOOTH EXTRACTIONS     ROTATOR CUFF REPAIR Left    TOTAL HIP ARTHROPLASTY Left 10/22/2020   Procedure: LEFT TOTAL HIP ARTHROPLASTY ANTERIOR APPROACH;  Surgeon: Frederik Pear, MD;  Location: [facility];   Service: Orthopedics;  Laterality: Left;     A IV Location/Drains/Wounds Patient Lines/Drains/Airways Status     Active Line/Drains/Airways     Name Placement date Placement time Site Days   Peripheral IV 09/05/22 18 G Posterior;Right Forearm 09/05/22  --  Forearm  1   Peripheral IV 09/05/22 18 G Left;Posterior Forearm 09/05/22  --  Forearm  1   External Urinary Catheter 08/27/21  1734  --  375   Incision (Closed) 10/22/20 Hip Left 10/22/20  0933  -- 684            Intake/Output Last 24 hours No intake or output data in the 24 hours ending 09/06/22 0103  Labs/Imaging Results for orders placed or performed during the hospital encounter of 09/05/22 (from the past 48 hour(s))  Protime-INR     Status: Abnormal   Collection Time: 09/05/22  6:37 PM  Result Value Ref Range   Prothrombin Time 16.2 (H) 11.4 - 15.2 seconds   INR 1.3 (H) 0.8 - 1.2    Comment: (NOTE) INR goal varies based on device and disease states. Performed at [facility], [place] [address] [place], [place] [address]   APTT     Status: None   Collection Time: 09/05/22  6:37 PM  Result Value Ref Range   aPTT 35 24 - 36 seconds    Comment: Performed at [facility], [address] [place], [place] [address]  CBC     Status: Abnormal   Collection Time: 09/05/22  6:37 PM  Result Value Ref Range   WBC 12.2 (H) 4.0 - 10.5 K/uL   RBC 4.13 3.87 - 5.11 MIL/uL   Hemoglobin 11.1 (L) 12.0 - 15.0 g/dL   HCT 35.7 (L) 36.0 - 46.0 %   MCV 86.4 80.0 - 100.0 fL   MCH 26.9 26.0 - 34.0 pg   MCHC 31.1 30.0 - 36.0 g/dL   RDW 14.1 11.5 - 15.5 %   Platelets 239 150 - 400 K/uL   nRBC 0.0 0.0 - 0.2 %    Comment: Performed at [facility], [place] [address] [place], [place] [address]  Differential     Status: Abnormal   Collection Time: 09/05/22  6:37 PM  Result Value Ref Range   Neutrophils Relative % 91 %   Neutro Abs 11.1 (H) 1.7 - 7.7 K/uL   Lymphocytes Relative 4 %   Lymphs Abs 0.4 (L) 0.7 - 4.0 K/uL    Monocytes Relative 5 %   Monocytes Absolute 0.6 0.1 - 1.0 K/uL   Eosinophils Relative 0 %   Eosinophils Absolute 0.0 0.0 - 0.5 K/uL   Basophils Relative 0 %   Basophils Absolute 0.0 0.0 - 0.1 K/uL   Immature Granulocytes 0 %   Abs Immature Granulocytes 0.04 0.00 - 0.07 K/uL    Comment: Performed at [facility] [address], [place], [place] [address]  Comprehensive metabolic panel     Status: Abnormal   Collection Time: 09/05/22  6:37 PM  Result Value Ref Range   Sodium 136 135 - 145 mmol/L   Potassium 4.8 3.5 - 5.1 mmol/L   Chloride 104 98 - 111 mmol/L   CO2 22 22 - 32 mmol/L   Glucose, Bld 130 (H) 70 - 99 mg/dL    Comment: Glucose reference range applies only to samples taken after fasting for at least 8 hours.   BUN 29 (H) 8 - 23 mg/dL   Creatinine, Ser 1.19 (H) 0.44 - 1.00 mg/dL   Calcium 8.9 8.9 - 10.3 mg/dL   Total Protein 6.4 (L) 6.5 - 8.1 g/dL   Albumin 3.6 3.5 - 5.0 g/dL   AST 17 15 - 41 U/L   ALT 15 0 - 44 U/L   Alkaline Phosphatase 55 38 - 126 U/L   Total Bilirubin 0.3 0.3 - 1.2 mg/dL   GFR, Estimated 45 (L) >60 mL/min    Comment: (NOTE) Calculated using the CKD-EPI Creatinine Equation (2021)    Anion gap 10 5 - 15    Comment: Performed at [facility] [address], [place], [place] [address]  Ethanol     Status: None   Collection Time: 09/05/22  6:37 PM  Result Value Ref Range   Alcohol, Ethyl (B) <10 <10 mg/dL    Comment: (NOTE) Lowest detectable limit for serum alcohol is 10 mg/dL.  For medical purposes only. Performed at [facility], [place] [address], [place], [place] [address]   CBG monitoring, ED     Status: Abnormal   Collection Time: 09/05/22  6:40 PM  Result Value Ref Range   Glucose-Capillary 128 (H) 70 - 99 mg/dL    Comment: Glucose reference range applies only to samples taken after fasting for at least 8 hours.  I-stat chem 8, ED     Status: Abnormal   Collection Time: 09/05/22  6:42 PM  Result Value Ref Range    Sodium 138 135 - 145 mmol/L   Potassium 4.9 3.5 - 5.1 mmol/L   Chloride 104 98 - 111 mmol/L   BUN 32 (H) 8 - 23 mg/dL   Creatinine, Ser 1.30 (H) 0.44 - 1.00 mg/dL   Glucose, Bld 131 (H) 70 - 99 mg/dL    Comment: Glucose reference range applies only to samples taken after fasting for at least 8 hours.   Calcium, Ion 1.15 1.15 - 1.40 mmol/L   TCO2 25 22 - 32 mmol/L   Hemoglobin 11.9 (L) 12.0 - 15.0 g/dL   HCT 35.0 (L) 36.0 - 46.0 %  Urinalysis, Routine w reflex microscopic -Urine, Clean Catch     Status: None   Collection Time: 09/05/22  7:32 PM  Result Value Ref Range   Color, Urine YELLOW YELLOW   APPearance CLEAR CLEAR   Specific Gravity, Urine 1.029 1.005 - 1.030   pH 5.0 5.0 - 8.0   Glucose, UA NEGATIVE NEGATIVE mg/dL   Hgb urine dipstick NEGATIVE NEGATIVE   Bilirubin Urine NEGATIVE NEGATIVE   Ketones, ur NEGATIVE NEGATIVE mg/dL   Protein, ur NEGATIVE NEGATIVE mg/dL   Nitrite NEGATIVE NEGATIVE   Leukocytes,Ua NEGATIVE NEGATIVE    Comment: Performed at [facility], [place] [address], [place], [place] [address]  Ammonia     Status: None   Collection Time: 09/05/22 10:54 PM  Result Value Ref Range   Ammonia 17 9 - 35 umol/L    Comment: HEMOLYSIS AT THIS LEVEL MAY AFFECT RESULT Performed at [facility], [place] [address] [place], [place] [address]   TSH     Status: None   Collection Time: 09/05/22 10:54 PM  Result Value Ref Range   TSH 2.031 0.350 - 4.500 uIU/mL    Comment: Performed by a 3rd Generation assay with a functional sensitivity of <=0.01 uIU/mL. Performed at [facility], [place] [address] [place], [place] [address]   Vitamin B12     Status: None   Collection Time: 09/05/22 10:54 PM  Result Value Ref Range   Vitamin B-12 786 180 - 914 pg/mL    Comment: (NOTE) This assay is not validated for testing neonatal or myeloproliferative syndrome specimens for Vitamin B12 levels. Performed at [facility], [place] [address], [place],  [place] [address]   Folate     Status: None   Collection Time: 09/05/22 10:54 PM  Result Value Ref Range   Folate 8.0 >5.9 ng/mL    Comment: Performed at [facility], [place] [address] [place], [place] [address]  Resp panel by RT-PCR (RSV, Flu A&B, Covid) Anterior Nasal Swab     Status: None   Collection Time: 09/05/22 11:27 PM   Specimen: Anterior Nasal Swab  Result Value Ref Range   SARS Coronavirus 2 by RT PCR NEGATIVE NEGATIVE   Influenza A by PCR NEGATIVE NEGATIVE   Influenza B by PCR NEGATIVE NEGATIVE    Comment: (NOTE) The Xpert Xpress SARS-CoV-2/FLU/RSV plus assay is intended as an aid in the diagnosis of influenza from Nasopharyngeal swab specimens and should not be used as a sole basis for treatment. Nasal washings and aspirates are unacceptable for Xpert Xpress SARS-CoV-2/FLU/RSV testing.  Fact Sheet for Patients: EntrepreneurPulse.com.au  Fact Sheet for Healthcare Providers: IncredibleEmployment.be  This test is not yet approved or cleared by the [place] FDA and has been authorized for detection and/or diagnosis of SARS-CoV-2 by FDA  under an Emergency Use Authorization (EUA). This EUA will remain in effect (meaning this test can be used) for the duration of the COVID-19 declaration under Section 564(b)(1) of the Act, 21 U.S.C. section 360bbb-3(b)(1), unless the authorization is terminated or revoked.     Resp Syncytial Virus by PCR NEGATIVE NEGATIVE    Comment: (NOTE) Fact Sheet for Patients: EntrepreneurPulse.com.au  Fact Sheet for Healthcare Providers: IncredibleEmployment.be  This test is not yet approved or cleared by the [place] FDA and has been authorized for detection and/or diagnosis of SARS-CoV-2 by FDA under an Emergency Use Authorization (EUA). This EUA will remain in effect (meaning this test can be used) for the duration of the COVID-19 declaration under Section  564(b)(1) of the Act, 21 U.S.C. section 360bbb-3(b)(1), unless the authorization is terminated or revoked.  Performed at [facility], [place] [address] [place], [place] [address]    MR Brain Wo Contrast (neuro protocol)  Result Date: 09/05/2022 CLINICAL DATA:  Acute neurologic deficit EXAM: MRI HEAD WITHOUT CONTRAST TECHNIQUE: Multiplanar, multiecho pulse sequences of the brain and surrounding structures were obtained without intravenous contrast. COMPARISON:  None Available. FINDINGS: Brain: No acute infarct, mass effect or extra-axial collection. Chronic microhemorrhages in the left temporal and occipital lobes. There is multifocal hyperintense T2-weighted signal within the white matter. Parenchymal volume and CSF spaces are normal. The midline structures are normal. Vascular: Normal flow voids. Skull and upper cervical spine: Normal marrow signal. Sinuses/Orbits: Negative. Other: None. IMPRESSION: 1. No acute intracranial abnormality. 2. Findings of chronic small vessel ischemia. Electronically Signed   By: Ulyses Jarred M.D.   On: 09/05/2022 21:48   DG Chest 2 View  Result Date: 09/05/2022 CLINICAL DATA:  Possible infection EXAM: CHEST - 2 VIEW COMPARISON:  09/07/2021 FINDINGS: Heart borderline in size. Mild hyperinflation. No confluent airspace opacity or effusion. No acute bony abnormality. IMPRESSION: No active cardiopulmonary disease. Electronically Signed   By: Rolm Baptise M.D.   On: 09/05/2022 20:12   CT HEAD CODE STROKE WO CONTRAST  Result Date: 09/05/2022 CLINICAL DATA:  Aphasia EXAM: CT HEAD WITHOUT CONTRAST CT ANGIOGRAPHY OF THE HEAD AND NECK TECHNIQUE: Contiguous axial images were obtained from the base of the skull through the vertex without intravenous contrast. Multidetector CT imaging of the head and neck was performed using the standard protocol during bolus administration of intravenous contrast. Multiplanar CT image reconstructions and MIPs were obtained to evaluate the  vascular anatomy. Carotid stenosis measurements (when applicable) are obtained utilizing NASCET criteria, using the distal internal carotid diameter as the denominator. RADIATION DOSE REDUCTION: This exam was performed according to the departmental dose-optimization program which includes automated exposure control, adjustment of the mA and/or kV according to patient size and/or use of iterative reconstruction technique. CONTRAST:  20mL ISOVUE-370 IOPAMIDOL (ISOVUE-370) INJECTION 76% COMPARISON:  None Available. FINDINGS: CT HEAD Brain: There is no mass, hemorrhage or extra-axial collection. The size and configuration of the ventricles and extra-axial CSF spaces are normal. There is hypoattenuation of the periventricular white matter, most commonly indicating chronic ischemic microangiopathy. Skull: The visualized skull base, calvarium and extracranial soft tissues are normal. Sinuses/Orbits: No fluid levels or advanced mucosal thickening of the visualized paranasal sinuses. No mastoid or middle ear effusion. The orbits are normal. ASPECTS ([place] Stroke Program Early CT Score) - Ganglionic level infarction (caudate, lentiform nuclei, internal capsule, insula, M1-M3 cortex): 7 - Supraganglionic infarction (M4-M6 cortex): 3 Total score (0-10 with 10 being normal): 10 CTA NECK FINDINGS SKELETON: There is no  bony spinal canal stenosis. No lytic or blastic lesion. OTHER NECK: Normal pharynx, larynx and major salivary glands. No cervical lymphadenopathy. Unremarkable thyroid gland. UPPER CHEST: No pneumothorax or pleural effusion. No nodules or masses. AORTIC ARCH: There is no calcific atherosclerosis of the aortic arch. There is no aneurysm, dissection or hemodynamically significant stenosis of the visualized portion of the aorta. Conventional 3 vessel aortic branching pattern. The visualized proximal subclavian arteries are widely patent. RIGHT CAROTID SYSTEM: No dissection, occlusion or aneurysm. Mild atherosclerotic  calcification at the carotid bifurcation without hemodynamically significant stenosis. LEFT CAROTID SYSTEM: No dissection, occlusion or aneurysm. Mild atherosclerotic calcification at the carotid bifurcation without hemodynamically significant stenosis. VERTEBRAL ARTERIES: Left dominant configuration. Both origins are clearly patent. There is no dissection, occlusion or flow-limiting stenosis to the skull base (V1-V3 segments). CTA HEAD FINDINGS POSTERIOR CIRCULATION: --Vertebral arteries: Bilateral atherosclerotic calcification with moderate right and mild left stenosis. --Inferior cerebellar arteries: Normal. --Basilar artery: Normal. --Superior cerebellar arteries: Normal. --Posterior cerebral arteries (PCA): Normal. ANTERIOR CIRCULATION: --Intracranial internal carotid arteries: Atherosclerotic calcification of the internal carotid arteries at the skull base without hemodynamically significant stenosis. --Anterior cerebral arteries (ACA): Normal. Both A1 segments are present. Patent anterior communicating artery (a-comm). --Middle cerebral arteries (MCA): Normal. VENOUS SINUSES: As permitted by contrast timing, patent. ANATOMIC VARIANTS: None Review of the MIP images confirms the above findings. IMPRESSION: 1. No intracranial hemorrhage. ASPECTS is 10. 2. No emergent large vessel occlusion. 3. Moderate right and mild left vertebral artery segment V4 stenosis secondary to atherosclerotic calcification. 4. Mild bilateral carotid bifurcation atherosclerosis without hemodynamically significant stenosis. These results were called by telephone at the time of interpretation on 09/05/2022 at 7:15 pm to provider [facility] , who verbally acknowledged these results. Electronically Signed   By: Ulyses Jarred M.D.   On: 09/05/2022 19:22   CT ANGIO HEAD NECK W WO CM (CODE STROKE)  Result Date: 09/05/2022 CLINICAL DATA:  Aphasia EXAM: CT HEAD WITHOUT CONTRAST CT ANGIOGRAPHY OF THE HEAD AND NECK TECHNIQUE: Contiguous  axial images were obtained from the base of the skull through the vertex without intravenous contrast. Multidetector CT imaging of the head and neck was performed using the standard protocol during bolus administration of intravenous contrast. Multiplanar CT image reconstructions and MIPs were obtained to evaluate the vascular anatomy. Carotid stenosis measurements (when applicable) are obtained utilizing NASCET criteria, using the distal internal carotid diameter as the denominator. RADIATION DOSE REDUCTION: This exam was performed according to the departmental dose-optimization program which includes automated exposure control, adjustment of the mA and/or kV according to patient size and/or use of iterative reconstruction technique. CONTRAST:  27mL ISOVUE-370 IOPAMIDOL (ISOVUE-370) INJECTION 76% COMPARISON:  None Available. FINDINGS: CT HEAD Brain: There is no mass, hemorrhage or extra-axial collection. The size and configuration of the ventricles and extra-axial CSF spaces are normal. There is hypoattenuation of the periventricular white matter, most commonly indicating chronic ischemic microangiopathy. Skull: The visualized skull base, calvarium and extracranial soft tissues are normal. Sinuses/Orbits: No fluid levels or advanced mucosal thickening of the visualized paranasal sinuses. No mastoid or middle ear effusion. The orbits are normal. ASPECTS ([place] Stroke Program Early CT Score) - Ganglionic level infarction (caudate, lentiform nuclei, internal capsule, insula, M1-M3 cortex): 7 - Supraganglionic infarction (M4-M6 cortex): 3 Total score (0-10 with 10 being normal): 10 CTA NECK FINDINGS SKELETON: There is no bony spinal canal stenosis. No lytic or blastic lesion. OTHER NECK: Normal pharynx, larynx and major salivary glands. No cervical lymphadenopathy. Unremarkable thyroid gland. UPPER  CHEST: No pneumothorax or pleural effusion. No nodules or masses. AORTIC ARCH: There is no calcific atherosclerosis of  the aortic arch. There is no aneurysm, dissection or hemodynamically significant stenosis of the visualized portion of the aorta. Conventional 3 vessel aortic branching pattern. The visualized proximal subclavian arteries are widely patent. RIGHT CAROTID SYSTEM: No dissection, occlusion or aneurysm. Mild atherosclerotic calcification at the carotid bifurcation without hemodynamically significant stenosis. LEFT CAROTID SYSTEM: No dissection, occlusion or aneurysm. Mild atherosclerotic calcification at the carotid bifurcation without hemodynamically significant stenosis. VERTEBRAL ARTERIES: Left dominant configuration. Both origins are clearly patent. There is no dissection, occlusion or flow-limiting stenosis to the skull base (V1-V3 segments). CTA HEAD FINDINGS POSTERIOR CIRCULATION: --Vertebral arteries: Bilateral atherosclerotic calcification with moderate right and mild left stenosis. --Inferior cerebellar arteries: Normal. --Basilar artery: Normal. --Superior cerebellar arteries: Normal. --Posterior cerebral arteries (PCA): Normal. ANTERIOR CIRCULATION: --Intracranial internal carotid arteries: Atherosclerotic calcification of the internal carotid arteries at the skull base without hemodynamically significant stenosis. --Anterior cerebral arteries (ACA): Normal. Both A1 segments are present. Patent anterior communicating artery (a-comm). --Middle cerebral arteries (MCA): Normal. VENOUS SINUSES: As permitted by contrast timing, patent. ANATOMIC VARIANTS: None Review of the MIP images confirms the above findings. IMPRESSION: 1. No intracranial hemorrhage. ASPECTS is 10. 2. No emergent large vessel occlusion. 3. Moderate right and mild left vertebral artery segment V4 stenosis secondary to atherosclerotic calcification. 4. Mild bilateral carotid bifurcation atherosclerosis without hemodynamically significant stenosis. These results were called by telephone at the time of interpretation on 09/05/2022 at 7:15 pm to  provider [facility] , who verbally acknowledged these results. Electronically Signed   By: Ulyses Jarred M.D.   On: 09/05/2022 19:22    Pending Labs Unresulted Labs (From admission, onward)     Start     Ordered   09/06/22 0500  Lipid panel  (Labs)  Tomorrow morning,   R       Comments: Fasting    09/05/22 2210   09/06/22 0500  Hemoglobin A1c  (Labs)  Tomorrow morning,   R       Comments: To assess prior glycemic control    09/05/22 2210   09/06/22 0500  CBC  (Labs)  Tomorrow morning,   R        09/05/22 2210   09/06/22 3710  Basic metabolic panel  Tomorrow morning,   R        09/05/22 2210            Vitals/Pain Today's Vitals   09/05/22 1933 09/05/22 2018 09/06/22 0000 09/06/22 0009  BP:   (!) 94/48   Pulse:   75 71  Resp:   13 14  Temp:  99.6 F (37.6 C)  98.1 F (36.7 C)  TempSrc:  Rectal  Oral  SpO2:   94% 95%  Weight:      PainSc: 0-No pain       Isolation Precautions Airborne and Contact precautions  Medications Medications   stroke: early stages of recovery book (has no administration in time range)  0.9 %  sodium chloride infusion ( Intravenous New Bag/Given 09/06/22 0006)  acetaminophen (TYLENOL) tablet 650 mg (has no administration in time range)    Or  acetaminophen (TYLENOL) 160 MG/5ML solution 650 mg (has no administration in time range)    Or  acetaminophen (TYLENOL) suppository 650 mg (has no administration in time range)  senna-docusate (Senokot-S) tablet 1 tablet (has no administration in time range)  ondansetron (ZOFRAN) injection 4 mg (has no administration  in time range)  levothyroxine (SYNTHROID) tablet 75 mcg (has no administration in time range)  memantine (NAMENDA) tablet 5 mg (has no administration in time range)  sertraline (ZOLOFT) tablet 50 mg (has no administration in time range)  simvastatin (ZOCOR) tablet 20 mg (20 mg Oral Given 09/05/22 2242)  traZODone (DESYREL) tablet 100 mg (100 mg Oral Given 09/05/22 2242)  gabapentin  (NEURONTIN) capsule 200 mg (200 mg Oral Given 09/05/22 2241)  gabapentin (NEURONTIN) capsule 100 mg (has no administration in time range)  OLANZapine (ZYPREXA) tablet 10 mg (10 mg Oral Given 09/05/22 2247)  OLANZapine (ZYPREXA) tablet 5 mg (has no administration in time range)  apixaban (ELIQUIS) tablet 5 mg (5 mg Oral Given 09/05/22 2256)  iopamidol (ISOVUE-370) 76 % injection 75 mL (75 mLs Intravenous Contrast Given 09/05/22 1913)    Mobility walks with device     Focused Assessments Cardiac Assessment Handoff:    No results found for: "CKTOTAL", "CKMB", "CKMBINDEX", "TROPONINI" No results found for: "DDIMER" Does the Patient currently have chest pain? No   , Neuro Assessment Handoff:  Swallow screen pass? Yes    NIH Stroke Scale  Dizziness Present: No Headache Present: No Interval: Shift assessment Level of Consciousness (1a.)   : Alert, keenly responsive LOC Questions (1b. )   : Answers neither question correctly LOC Commands (1c. )   : Performs both tasks correctly Best Gaze (2. )  : Normal Visual (3. )  : No visual loss Facial Palsy (4. )    : Normal symmetrical movements Motor Arm, Left (5a. )   : No drift Motor Arm, Right (5b. ) : No drift Motor Leg, Left (6a. )  : Drift Motor Leg, Right (6b. ) : No drift Limb Ataxia (7. ): Absent Sensory (8. )  : Normal, no sensory loss Best Language (9. )  : Mild-to-moderate aphasia Dysarthria (10. ): Mild-to-moderate dysarthria, patient slurs at least some words and, at worst, can be understood with some difficulty Extinction/Inattention (11.)   : No Abnormality Complete NIHSS TOTAL: 6 Last date known well: 09/05/22 Last time known well: 1300 Neuro Assessment: Exceptions to WDL Neuro Checks:   Initial (09/05/22 1921)  Has TPA been given? No If patient is a Neuro Trauma and patient is going to OR before floor call report to [place] nurse: [phone] or [phone]  , Pulmonary Assessment Handoff:  Lung sounds:   O2  Device: Room Air      R Recommendations: See Admitting Provider Note  Report given to:   Additional Notes: n/a

## 2022-09-22 DIAGNOSIS — F320 Major depressive disorder, single episode, mild: Secondary | ICD-10-CM | POA: Diagnosis not present

## 2022-09-22 DIAGNOSIS — F0390 Unspecified dementia without behavioral disturbance: Secondary | ICD-10-CM | POA: Diagnosis not present

## 2022-09-22 DIAGNOSIS — N1831 Chronic kidney disease, stage 3a: Secondary | ICD-10-CM | POA: Diagnosis not present

## 2022-09-22 DIAGNOSIS — R031 Nonspecific low blood-pressure reading: Secondary | ICD-10-CM | POA: Diagnosis not present

## 2022-09-22 DIAGNOSIS — I779 Disorder of arteries and arterioles, unspecified: Secondary | ICD-10-CM | POA: Diagnosis not present

## 2022-09-22 DIAGNOSIS — D6859 Other primary thrombophilia: Secondary | ICD-10-CM | POA: Diagnosis not present

## 2022-09-23 ENCOUNTER — Inpatient Hospital Stay: Payer: Medicare PPO | Admitting: Oncology

## 2022-09-29 ENCOUNTER — Inpatient Hospital Stay: Payer: Medicare PPO | Attending: Oncology | Admitting: Oncology

## 2022-09-29 VITALS — BP 128/69 | HR 62 | Temp 97.7°F | Resp 20 | Ht 66.0 in | Wt 159.0 lb

## 2022-09-29 DIAGNOSIS — I10 Essential (primary) hypertension: Secondary | ICD-10-CM | POA: Diagnosis not present

## 2022-09-29 DIAGNOSIS — Z8673 Personal history of transient ischemic attack (TIA), and cerebral infarction without residual deficits: Secondary | ICD-10-CM

## 2022-09-29 DIAGNOSIS — I82451 Acute embolism and thrombosis of right peroneal vein: Secondary | ICD-10-CM | POA: Diagnosis not present

## 2022-09-29 DIAGNOSIS — I2699 Other pulmonary embolism without acute cor pulmonale: Secondary | ICD-10-CM | POA: Diagnosis not present

## 2022-09-29 DIAGNOSIS — E785 Hyperlipidemia, unspecified: Secondary | ICD-10-CM | POA: Diagnosis not present

## 2022-09-29 DIAGNOSIS — Z7901 Long term (current) use of anticoagulants: Secondary | ICD-10-CM

## 2022-09-29 DIAGNOSIS — F03C0 Unspecified dementia, severe, without behavioral disturbance, psychotic disturbance, mood disturbance, and anxiety: Secondary | ICD-10-CM

## 2022-09-29 DIAGNOSIS — I82401 Acute embolism and thrombosis of unspecified deep veins of right lower extremity: Secondary | ICD-10-CM

## 2022-09-29 NOTE — Progress Notes (Signed)
[facility] New Patient Consult   Requesting MD: Camie Patience, Fnp [address] [place],  [place] [address]   [name] 87 y.o.  Sep 12, 1935    Reason for Consult: DVT/pulmonary embolism   HPI: [name] was hospitalized in March 2023 with a fever, dyspnea, cough, and altered mental status.  A CT chest 08/28/2021 revealed bilateral extensive pulmonary emboli.  There was left lower lobe consolidation, potentially due to pulmonary infarction.  She was treated with heparin and transition to apixaban.  She continues apixaban. A Doppler on 08/28/2021 confirmed acute DVT of the right peroneal veins. [name] is here today with her granddaughter.  [name] has advanced dementia.  The history is from her granddaughter.  She is referred by Chari Manning to consider the indication for continuing anticoagulation therapy.  The granddaughter reports [name] has no previous history of venous thrombosis.  She currently has no bleeding.  She has experienced 1 fall over the past several months.  There is no known family history of venous thrombosis.  [name] did not have recent surgery or trauma prior to the pulmonary emboli in 2023.  She had COVID-19 in August 2021.  [name] has no specific complaint today.  The granddaughter reports [name] stays in a recliner most of the day.  She is ambulatory in the home.  He is felt to have a good quality of life.  She enjoys going out to eat and interacting with her grandchildren and pets.  She was admitted with an episode of transient expressive aphasia 09/05/2022.  Past Medical History:  Diagnosis Date   Anxiety    Arthritis    Depression    Hypertension    Pre-diabetes    Stroke (HCC)-transient facial droop and one-sided weakness-resolved    6 years ago   Thyroid disease     .  Mention   .  G3, P3   .  COVID-19 in August 2021  Past Surgical History:  Procedure Laterality Date   ABDOMINAL HYSTERECTOMY      CHOLECYSTECTOMY     MULTIPLE TOOTH EXTRACTIONS     ROTATOR CUFF REPAIR Left    TOTAL HIP ARTHROPLASTY Left 10/22/2020   Procedure: LEFT TOTAL HIP ARTHROPLASTY ANTERIOR APPROACH;  Surgeon: Gean Birchwood, MD;  Location: [facility];  Service: Orthopedics;  Laterality: Left;    Medications: Reviewed  Allergies:  Allergies  Allergen Reactions   Donepezil Other (See Comments)    POSSIBLE (??) Hallucinations and sleep disturbances   Escitalopram Oxalate Other (See Comments)    UNK reaction   Hydrochlorothiazide Other (See Comments)    UNK reaction    Family history: No family history of venous thrombosis  Social History:   She lives with her granddaughter in [name].  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.  She does not use cigarettes.  Social alcohol use.  No transfusion history.  No risk factor for hepatitis.  ROS:   Positives include: Severe memory loss  A complete ROS was otherwise negative.  Physical Exam:  Blood pressure 128/69, pulse 62, temperature 97.7 F (36.5 C), temperature source Oral, resp. rate 20, height 5\' 6"  (1.676 m), weight 159 lb (72.1 kg), SpO2 97 %.  HEENT: Oropharynx without visible mass, neck without mass Lungs: Clear bilaterally Cardiac: Regular rate and rhythm Abdomen: No hepatosplenomegaly  Vascular: No leg edema Lymph nodes: No cervical, supraclavicular, axillary, or inguinal nodes Neurologic: Alert, the motor exam appears intact  in the upper and lower extremities bilaterally.  Not oriented to place, diagnosis, or year.  She could not name her grandchildren. Skin: No rash Musculoskeletal: No spine tenderness   LAB:  CBC  Lab Results  Component Value Date   WBC 10.2 09/06/2022   HGB 9.8 (L) 09/06/2022   HCT 30.7 (L) 09/06/2022   MCV 85.3 09/06/2022   PLT 184 09/06/2022   NEUTROABS 11.1 (H) 09/05/2022        CMP  Lab Results  Component Value Date   NA 135 09/06/2022   K 3.8 09/06/2022   CL 102  09/06/2022   CO2 24 09/06/2022   GLUCOSE 105 (H) 09/06/2022   BUN 24 (H) 09/06/2022   CREATININE 1.06 (H) 09/06/2022   CALCIUM 8.3 (L) 09/06/2022   PROT 6.4 (L) 09/05/2022   ALBUMIN 3.6 09/05/2022   AST 17 09/05/2022   ALT 15 09/05/2022   ALKPHOS 55 09/05/2022   BILITOT 0.3 09/05/2022   GFRNONAA 51 (L) 09/06/2022   GFRAA 50 (L) 02/16/2020     No results found for: "CEA1", "OMB559", "CA125"  Imaging:  CT chest 08/28/2021-images reviewed with [name] granddaughter   Assessment/Plan:   Bilateral pulmonary embolism 08/29/2022 CT chest 08/29/2022-extensive bilateral pulmonary emboli without evidence of right heart strain, left lower lobe consolidation-early pulmonary infarction? Right peroneal DVT 08/29/2022 08/29/2022 heparin followed by apixaban anticoagulation Admission with transient expressive aphasia 09/05/2022-resolved, negative MRI brain Severe dementia Anemia during hospital admission March 2023 and March 2024 CVA in the remote past with no residual Hypertension Hyperlipidemia   Disposition:   [name] is referred for hematology evaluation after being diagnosed with bilateral pulmonary emboli in March 2024.  She has been maintained on apixaban anticoagulation.  She appears to have severe dementia.  I discussed the indication for continuing anticoagulation therapy with her granddaughter.  They understand the risk of continuing anticoagulation is bleeding, she has experienced at least 1 fall recently.  The risk of discontinuing anticoagulation is the development of recurrent thromboembolism.  Her granddaughter would like [name] to continue anticoagulation given the lack of a clear risk factor for the pulmonary embolism last year and her relative immobility.  We discussed reduced intensity anticoagulation and decided to change anticoagulation to apixaban 2.5 mg twice daily with her next refill.  She was noted to be anemic during hospital admission and March 2023 and again  last month.  The anemia could be related to bleeding while on apixaban or another etiology.  I recommend Chari Manning check the hemoglobin over the next few months.  I am available to see her if she develops significant or progressive anemia.  [name] plans to continue clinical follow-up with Chari Manning.  I am available to see her as needed.  Thornton Papas, MD  09/29/2022, 4:08 PM

## 2022-11-02 ENCOUNTER — Telehealth: Payer: Medicare PPO | Admitting: Neurology

## 2022-11-02 ENCOUNTER — Encounter: Payer: Self-pay | Admitting: Neurology

## 2022-11-02 VITALS — Ht 60.0 in | Wt 157.0 lb

## 2022-11-02 DIAGNOSIS — R4689 Other symptoms and signs involving appearance and behavior: Secondary | ICD-10-CM

## 2022-11-02 DIAGNOSIS — F02818 Dementia in other diseases classified elsewhere, unspecified severity, with other behavioral disturbance: Secondary | ICD-10-CM

## 2022-11-02 MED ORDER — GABAPENTIN 100 MG PO CAPS: ORAL_CAPSULE | ORAL | 3 refills | Status: DC

## 2022-11-02 MED ORDER — OLANZAPINE 10 MG PO TABS: ORAL_TABLET | ORAL | 3 refills | Status: DC

## 2022-11-02 MED ORDER — MEMANTINE HCL 5 MG PO TABS: 5.0000 mg | ORAL_TABLET | Freq: Every morning | ORAL | 3 refills | Status: DC

## 2022-11-02 MED ORDER — SERTRALINE HCL 50 MG PO TABS: 50.0000 mg | ORAL_TABLET | Freq: Every day | ORAL | 3 refills | Status: DC

## 2022-11-02 MED ORDER — TRAZODONE HCL 50 MG PO TABS: ORAL_TABLET | ORAL | 3 refills | Status: DC

## 2022-11-02 NOTE — Patient Instructions (Signed)
Good to see you. Continue all your medications, refills sent. Continue 24/7 care. Follow-up in 1 year, call for any changes.

## 2022-11-02 NOTE — Progress Notes (Signed)
Virtual Visit via Video Note The purpose of this virtual visit is to provide medical care while limiting exposure to the novel coronavirus.    Consent was obtained for video visit:  Yes.   Answered questions that patient had about telehealth interaction:  Yes.     Pt location: Home Physician Location: office Name of referring provider:  [name], MD I connected with [name] at patients initiation/request on 11/02/2022 at  2:30 PM EDT by video enabled telemedicine application and verified that I am speaking with the correct person using two identifiers. Pt MRN:  [id number] Pt DOB:  10-25-1935 Video Participants:  [name];  [name] (granddaughter)   History of Present Illness:  The patient had a virtual video visit on 11/02/2022. She was last seen in the office 9 months ago for dementia with behavioral disturbance. Her granddaughter [name] is present to provide information. [name] called our office earlier today asking to switch to a virtual visit as [name] did not want to come or get ready/take a shower, very combative. She has good and bad days. [name] was finally able to get her to shower, then she did not want to eat. For the past couple of weeks, there are a little more days of questioning why she needs to do things/confusion. She states she does not feel like doing things. She is sleeping great. No hallucinations. [name] manages medications, finances, meals. She needs help with bathing, following steps is difficult. She is able to use the bathroom independently, no incontinence. She was getting dizzy, this resolved with stopping Lisinopril.   She was admitted to [facility] last March 2024 after an episode where she was noted to be bending over staring at her feet, leaning to the left for over an hour. When they spoke to her, speech was not coherent. MRI brain no acute changes, there were chronic microhemorrhages in the left temporal and occipital lobes, moderate chronic microvascular  disease. EEG was normal. She had a CTA head/neck with moderate right and mild left vertebral artery segment V4 stenosis, LDL 49, HbA1c 6.1. UA negative. She improved during her stay, speaking fluently on hospital discharge. [name] reports she is overall stable on current regimen of Memantine 5mg  daily, Gabapentin 100mg  in AM, 200mg  in PM, olanzapine 10mg  1/2 tab in AM, 1 tab qhs, Sertraline 50mg  daily, and Trazodone 100mg  qhs without side effects. No falls, she forgets her walker everywhere so ambulates mostly without assistance.   History on Initial Assessment 01/01/2020: This is a pleasant 87 year old right-handed woman with a history of hypertension, hyperlipidemia, prior stroke with no residual deficits, presenting for evaluation of auditory hallucinations and memory loss. She states her memory "comes and goes." She lives with her granddaughter [name] and daughter-in-law [name]. [name] started noticing memory changes the last couple of years where she would be repeating herself. Recently, memory has worsened, she had 2 appointments in one day and did not remember the afternoon appointment. They have had to write down notes more. She manages her own medications and [name] has not noticed any issues with forgetting medications. She used to live with her husband then he passed away and she lived alone in [place] for 9 months, before she moved in with [place] 2 years ago. She was not missing any bill payments while alone, bills were switched to autopay 2 years ago. She continues to drive around [place] and denies getting lost. She was getting lost in [place], but they feel it was due  to being in an unfamiliar place. [name] started noticing auditory hallucinations while she was still living alone, she would say something was not right with the air, or the doors, so [name] moved her. She was saying the she would hear cars at night or someone trying to break in, barricading her door. She would say some boys were  drinking beside her condo or she had mice in the condo and put traps out but never caught any. Since moving in with [name] 2 years ago, she continued to have hallucinations, mostly at night initially, saying something was in the bed with her. Hallucinations have progressively worsened the past few months, also occurring in the daytime. She does not remember what she hears at night, [name] would remind her that she tells [name] she hears her granddaughter calling her "[name]" at night or during the day when the granddaughter is not home. She denies any visual hallucinations, however [name] reminds her the other day she thought she saw someone getting in the window, she heard things outside and said they were packing things into delivery trucks. She saw arms lifting things up, loading trucks up. She saw a shadow come back the other day on the 2nd level window. She would be afraid he would get a hold of the family.  She would be up all night, knocking on their doors telling them about the hallucinations. Melatonin did not help. Family brought her to the ER a week ago, CBC, CMP, urinalysis unremarkable except for anemia. I personally reviewed head CT without contrast which did not show any acute changes, there was diffuse cerebral and cerebellar atrophy, moderately extensive chronic microvascular disease. They were instructed to give Tylenol PM to help her sleep at night, she has been taking 2 tabs qhs and has been sleeping all night with this since then. No REM behavior disorder noted. [name] denies any prior psychiatric history. No hygiene concerns, she is able to bathe and dress independently. Her mother and father had Alzheimer's disease. No history of significant head injuries. She has not had any alcohol in a while.   She denies any headaches, dizziness, diplopia, dysarthria/dysphagia, neck/back pain, focal numbness/tingling/weakness, bowel/bladder dysfunction, anosmia, or tremors. Mood comes and goes, "but not bad."  Her last fall was in October 2020 when she tripped. She is scheduled for left hip replacement on 01/28/20.   Update 02/04/2020: Since her last visit, she was brought to the ER on 01/04/20 for worsening behaviors with auditory and visual hallucinations about babies crying while accusing family of killing the crying babies. She would run out of the house at all times at night. The police was in their home when she drove off without her phone. An IVC had to be issued so police could get her. She was brought to the ER where bloodwork was unremarkable. UA showed moderate leukocytes, 6-10 WBC, culture negative. UDS negative. She had an MRI brain on 01/05/20 which did not show any acute changes. There was diffuse cerebral atrophy, moderate chronic microvascular disease. She was admitted to [facility] from 7/20 to 8/4. She was started on gabapentin 150mg  every 8 hours (250mg /61mL taking 3 mLs TID), Memantine 5mg  daily, olanzapine 10mg  qhs, Sertraline 50mg  daily, and Trazodone 50mg  qhs. Donepezil was stopped since she had significant worsening the day she started it. She has had significant improvement since hospital discharge. No further hallucinations or delusions.     Current Outpatient Medications on File Prior to Visit  Medication Sig Dispense Refill  apixaban (ELIQUIS) 5 MG TABS tablet Take 1 tablet (5 mg total) by mouth 2 (two) times daily. (Patient taking differently: Take 2.5 mg by mouth once.)     Cholecalciferol (VITAMIN D3) 25 MCG (1000 UT) CAPS Take 1,000 Units by mouth daily.     gabapentin (NEURONTIN) 100 MG capsule Take 1 cap in AM, 2 caps in PM (Patient taking differently: Take 100-200 mg by mouth See admin instructions. Take 1 capsule by mouth in the morning and 2 capsules at bedtime) 270 capsule 3   levothyroxine (SYNTHROID) 75 MCG tablet Take 75 mcg by mouth daily before breakfast.     memantine (NAMENDA) 5 MG tablet Take 1 tablet (5 mg total) by mouth in the morning. 90 tablet 3    metoprolol succinate (TOPROL-XL) 25 MG 24 hr tablet Take 25 mg by mouth daily.     OLANZapine (ZYPREXA) 10 MG tablet TAKE 1/2 TABLET IN THE MORNING AND TAKE 1 TABLET AT BEDTIME (Patient taking differently: Take 5-10 mg by mouth See admin instructions. TAKE 1/2 TABLET BY MOUTH IN THE MORNING AND TAKE 1 TABLET AT BEDTIME) 135 tablet 3   sertraline (ZOLOFT) 50 MG tablet Take 1 tablet (50 mg total) by mouth daily. 90 tablet 3   simvastatin (ZOCOR) 20 MG tablet Take 20 mg by mouth at bedtime.      traZODone (DESYREL) 50 MG tablet TAKE 2 TABLETS AT BEDTIME (Patient taking differently: Take 100 mg by mouth at bedtime.) 180 tablet 3   vitamin B-12 (CYANOCOBALAMIN) 1000 MCG tablet Take 1,000 mcg by mouth daily.     No current facility-administered medications on file prior to visit.     Observations/Objective:   Vitals:   11/02/22 1418  Weight: 157 lb (71.2 kg)  Height: 5' (1.524 m)   GEN:  The patient appears stated age and is in NAD.  Neurological examination: Patient is awake, alert. No aphasia or dysarthria. Able to speak in full sentences, does not remember her cat's name. Cranial nerves: Extraocular movements intact. No facial asymmetry. Motor: moves all extremities symmetrically, at least anti-gravity x 4. No incoordination on finger to nose testing. Gait: slow and cautious with knees bent. No ataxia. No tremor.   Assessment and Plan:   This is a pleasant 87 yo RH woman with a history of hypertension, hyperlipidemia, prior stroke with no residual deficits, with likely Alzheimer's disease with behavioral changes. MRI brain no acute changes, there was diffuse cerebral atrophy, moderate chronic microvascular disease. Symptoms overall stable, psychosis controlled on current regimen. Continue olanzapine 10mg  1/2 tab in AM, 1 tab qhs, sertraline 50mg  daily, Trazodone 100mg  qhs, Memantine 5mg  daily, gabapentin 100mg  in AM, 200mg  qhs, refills sent. Continue 24/7 care. Follow-up in 1 year, call for  any changes.    Follow Up Instructions:   -I discussed the assessment and treatment plan with the patient's granddaughter. The patient's granddaughter was provided an opportunity to ask questions and all were answered. The patient's granddaughter agreed with the plan and demonstrated an understanding of the instructions.   The patient's granddaughter was advised to call back or seek an in-person evaluation if the symptoms worsen or if the condition fails to improve as anticipated.     Van Clines, MD  CC: Jorge Ny, FNP

## 2022-12-15 DIAGNOSIS — R4689 Other symptoms and signs involving appearance and behavior: Secondary | ICD-10-CM | POA: Diagnosis not present

## 2022-12-15 DIAGNOSIS — D649 Anemia, unspecified: Secondary | ICD-10-CM | POA: Diagnosis not present

## 2022-12-15 DIAGNOSIS — E782 Mixed hyperlipidemia: Secondary | ICD-10-CM | POA: Diagnosis not present

## 2022-12-15 DIAGNOSIS — E039 Hypothyroidism, unspecified: Secondary | ICD-10-CM | POA: Diagnosis not present

## 2022-12-15 DIAGNOSIS — I10 Essential (primary) hypertension: Secondary | ICD-10-CM | POA: Diagnosis not present

## 2022-12-15 DIAGNOSIS — R7303 Prediabetes: Secondary | ICD-10-CM | POA: Diagnosis not present

## 2022-12-15 DIAGNOSIS — F0390 Unspecified dementia without behavioral disturbance: Secondary | ICD-10-CM | POA: Diagnosis not present

## 2022-12-15 DIAGNOSIS — N1831 Chronic kidney disease, stage 3a: Secondary | ICD-10-CM | POA: Diagnosis not present

## 2022-12-15 DIAGNOSIS — D6859 Other primary thrombophilia: Secondary | ICD-10-CM | POA: Diagnosis not present

## 2022-12-16 DIAGNOSIS — E039 Hypothyroidism, unspecified: Secondary | ICD-10-CM | POA: Diagnosis not present

## 2022-12-16 DIAGNOSIS — R7303 Prediabetes: Secondary | ICD-10-CM | POA: Diagnosis not present

## 2022-12-16 DIAGNOSIS — I10 Essential (primary) hypertension: Secondary | ICD-10-CM | POA: Diagnosis not present

## 2022-12-16 DIAGNOSIS — R4689 Other symptoms and signs involving appearance and behavior: Secondary | ICD-10-CM | POA: Diagnosis not present

## 2022-12-16 DIAGNOSIS — D649 Anemia, unspecified: Secondary | ICD-10-CM | POA: Diagnosis not present

## 2023-01-02 ENCOUNTER — Other Ambulatory Visit: Payer: Self-pay | Admitting: Neurology

## 2023-01-06 ENCOUNTER — Other Ambulatory Visit: Payer: Self-pay

## 2023-01-06 MED ORDER — MEMANTINE HCL 5 MG PO TABS: 5.0000 mg | ORAL_TABLET | Freq: Every morning | ORAL | 1 refills | Status: DC

## 2023-04-05 ENCOUNTER — Emergency Department (HOSPITAL_COMMUNITY)
Admission: EM | Admit: 2023-04-05 | Discharge: 2023-04-05 | Disposition: A | Discharge status: Home / Self Care | Payer: Medicare PPO | Attending: Emergency Medicine | Admitting: Emergency Medicine

## 2023-04-05 ENCOUNTER — Other Ambulatory Visit: Payer: Self-pay

## 2023-04-05 ENCOUNTER — Encounter (HOSPITAL_COMMUNITY): Payer: Self-pay

## 2023-04-05 ENCOUNTER — Emergency Department (HOSPITAL_COMMUNITY): Payer: Medicare PPO

## 2023-04-05 DIAGNOSIS — W0110XA Fall on same level from slipping, tripping and stumbling with subsequent striking against unspecified object, initial encounter: Secondary | ICD-10-CM | POA: Insufficient documentation

## 2023-04-05 DIAGNOSIS — G319 Degenerative disease of nervous system, unspecified: Secondary | ICD-10-CM | POA: Diagnosis not present

## 2023-04-05 DIAGNOSIS — Z8673 Personal history of transient ischemic attack (TIA), and cerebral infarction without residual deficits: Secondary | ICD-10-CM | POA: Diagnosis not present

## 2023-04-05 DIAGNOSIS — Z7901 Long term (current) use of anticoagulants: Secondary | ICD-10-CM | POA: Diagnosis not present

## 2023-04-05 DIAGNOSIS — F0390 Unspecified dementia without behavioral disturbance: Secondary | ICD-10-CM | POA: Diagnosis not present

## 2023-04-05 DIAGNOSIS — S199XXA Unspecified injury of neck, initial encounter: Secondary | ICD-10-CM | POA: Diagnosis not present

## 2023-04-05 DIAGNOSIS — Z79899 Other long term (current) drug therapy: Secondary | ICD-10-CM | POA: Diagnosis not present

## 2023-04-05 DIAGNOSIS — R220 Localized swelling, mass and lump, head: Secondary | ICD-10-CM | POA: Diagnosis present

## 2023-04-05 DIAGNOSIS — Y9301 Activity, walking, marching and hiking: Secondary | ICD-10-CM | POA: Diagnosis not present

## 2023-04-05 DIAGNOSIS — R58 Hemorrhage, not elsewhere classified: Secondary | ICD-10-CM | POA: Diagnosis not present

## 2023-04-05 DIAGNOSIS — S0990XA Unspecified injury of head, initial encounter: Secondary | ICD-10-CM | POA: Diagnosis not present

## 2023-04-05 DIAGNOSIS — Z23 Encounter for immunization: Secondary | ICD-10-CM | POA: Diagnosis not present

## 2023-04-05 DIAGNOSIS — I10 Essential (primary) hypertension: Secondary | ICD-10-CM | POA: Diagnosis not present

## 2023-04-05 DIAGNOSIS — S0001XA Abrasion of scalp, initial encounter: Secondary | ICD-10-CM | POA: Insufficient documentation

## 2023-04-05 DIAGNOSIS — W19XXXA Unspecified fall, initial encounter: Secondary | ICD-10-CM | POA: Diagnosis not present

## 2023-04-05 MED ORDER — TETANUS-DIPHTH-ACELL PERTUSSIS 5-2.5-18.5 LF-MCG/0.5 IM SUSY
0.5000 mL | PREFILLED_SYRINGE | Freq: Once | INTRAMUSCULAR | Status: AC
  Administered 2023-04-05: 0.5 mL via INTRAMUSCULAR
  Filled 2023-04-05: qty 0.5

## 2023-04-05 NOTE — ED Provider Notes (Signed)
[place] EMERGENCY DEPARTMENT AT [facility] Provider Note  CSN: [id number] Arrival date & time: 04/05/23 1526  Chief Complaint(s) Fall  HPI DALAYAH DEAHL is a 87 y.o. female with past medical history as below, significant for depression, hypertension, CVA, dementia, anticoagulated who presents to the ED with complaint of fall  Patient presents from home by EMS.  She had a fall backwards earlier today.  Per EMS patient was reportedly to have tripped over an object on the floor and fell backwards hit her head.  LOC was not by family, no seizure activity reported by family.  Patient is a poor historian given underlying dementia but denies any acute complaints at this time.  She is unsure of last tetanus shot.  Past Medical History Past Medical History:  Diagnosis Date   Anxiety    Arthritis    Depression    Hypertension    Pre-diabetes    Stroke (HCC)    6 years ago   Thyroid disease    Patient Active Problem List   Diagnosis Date Noted   Expressive aphasia 09/05/2022   AKI (acute kidney injury) (HCC) 09/05/2022   Leukocytosis 09/05/2022   History of pulmonary embolism 09/05/2022   CAP (community acquired pneumonia) 08/31/2021   Acute deep vein thrombosis (DVT) of right lower extremity (HCC) 08/31/2021   Sepsis (HCC) 08/28/2021   Hypothyroidism, unspecified 08/28/2021   H/O total hip arthroplasty, left 10/22/2020   Osteoarthritis of left hip 02/06/2020   Anxiety 02/04/2020   CVA (cerebral vascular accident) (HCC) 02/04/2020   Depression 02/04/2020   Essential hypertension 02/04/2020   HSV-2 (herpes simplex virus 2) infection 02/04/2020   Insomnia 02/04/2020   Vitamin D deficiency 02/04/2020   Constipation 01/09/2020   Hearing loss 01/09/2020   Hx of completed stroke 01/09/2020   Hx of herpes simplex infection 01/09/2020   Mixed dyslipidemia 01/09/2020   Normocytic anemia 01/09/2020   Prediabetes 01/09/2020   Primary osteoarthritis involving multiple  joints 01/09/2020   Venous insufficiency 01/09/2020   Vitamin B12 deficiency 01/09/2020   Dementia with behavioral disturbance 01/08/2020   Delusional thoughts (HCC) 01/06/2020   Aggressive behavior    Hallucinations    Acquired hypothyroidism 03/18/2014   Mixed hyperlipidemia 03/18/2014   Home Medication(s) Prior to Admission medications   Medication Sig Start Date End Date Taking? Authorizing Provider  Cholecalciferol (VITAMIN D3) 25 MCG (1000 UT) CAPS Take 1,000 Units by mouth daily. 05/29/21  Yes [provider]  ELIQUIS 2.5 MG TABS tablet Take 2.5 mg by mouth 2 (two) times daily. 12/27/22  Yes [provider]  gabapentin (NEURONTIN) 100 MG capsule Take 1 cap in AM, 2 caps in PM Patient taking differently: Take 100-200 mg by mouth See admin instructions. Take 1 capsule (100mg ) in the morning and 2 capsules (200mg ) in the evening for a total daily dose of 300mg  11/02/22  Yes Van Clines, MD  levothyroxine (SYNTHROID) 75 MCG tablet Take 75 mcg by mouth daily before breakfast.   Yes [provider]  memantine (NAMENDA) 5 MG tablet Take 1 tablet (5 mg total) by mouth in the morning. 01/06/23  Yes Van Clines, MD  metoprolol succinate (TOPROL-XL) 25 MG 24 hr tablet Take 25 mg by mouth daily. 08/11/22  Yes [provider]  OLANZapine (ZYPREXA) 10 MG tablet TAKE 1/2 TABLET IN THE MORNING AND TAKE 1 TABLET AT BEDTIME Patient taking differently: Take 5-10 mg by mouth See admin instructions. Take 1/2 tablet (5mg ) in the morning and  1 tablet (10mg ) in the evening for a total daily dose of 15mg . 11/02/22  Yes Van Clines, MD  sertraline (ZOLOFT) 50 MG tablet Take 1 tablet (50 mg total) by mouth daily. 11/02/22  Yes Van Clines, MD  simvastatin (ZOCOR) 20 MG tablet Take 20 mg by mouth at bedtime.    Yes [provider]  traZODone (DESYREL) 50 MG tablet TAKE 2 TABLETS AT BEDTIME Patient taking differently: Take 100 mg by mouth at bedtime. Scheduled  01/03/23  Yes Van Clines, MD  vitamin B-12 (CYANOCOBALAMIN) 1000 MCG tablet Take 1,000 mcg by mouth daily. 05/29/21  Yes [provider]                                                                                                                                    Past Surgical History Past Surgical History:  Procedure Laterality Date   ABDOMINAL HYSTERECTOMY     CHOLECYSTECTOMY     MULTIPLE TOOTH EXTRACTIONS     ROTATOR CUFF REPAIR Left    TOTAL HIP ARTHROPLASTY Left 10/22/2020   Procedure: LEFT TOTAL HIP ARTHROPLASTY ANTERIOR APPROACH;  Surgeon: Gean Birchwood, MD;  Location: [facility];  Service: Orthopedics;  Laterality: Left;   Family History History reviewed. No pertinent family history.  Social History Social History   Tobacco Use   Smoking status: Never   Smokeless tobacco: Never  Vaping Use   Vaping status: Never Used  Substance Use Topics   Alcohol use: Never   Drug use: Never   Allergies Donepezil, Escitalopram oxalate, and Hydrochlorothiazide  Review of Systems Review of Systems  Unable to perform ROS: Dementia  Skin:  Positive for wound.    Physical Exam Vital Signs  I have reviewed the triage vital signs BP (!) 146/74 (BP Location: Left Arm)   Pulse (!) 58   Temp 97.9 F (36.6 C) (Oral)   Resp 16   SpO2 98%  Physical Exam Vitals and nursing note reviewed.  Constitutional:      General: She is not in acute distress.    Appearance: Normal appearance.  HENT:     Head: Normocephalic. No raccoon eyes, Battle's sign, right periorbital erythema or left periorbital erythema.     Jaw: There is normal jaw occlusion.     Comments: Superficial wound occiput    Right Ear: External ear normal.     Left Ear: External ear normal.     Nose: Nose normal.     Mouth/Throat:     Mouth: Mucous membranes are moist.  Eyes:     General: No scleral icterus.       Right eye: No discharge.        Left eye: No discharge.     Extraocular Movements: Extraocular  movements intact.     Pupils: Pupils are equal, round, and reactive to light.  Cardiovascular:     Rate and Rhythm: Normal rate and  regular rhythm.     Pulses: Normal pulses.     Heart sounds: Normal heart sounds.  Pulmonary:     Effort: Pulmonary effort is normal. No respiratory distress.     Breath sounds: Normal breath sounds. No stridor.  Abdominal:     General: Abdomen is flat. There is no distension.     Palpations: Abdomen is soft.     Tenderness: There is no abdominal tenderness.  Musculoskeletal:     Cervical back: No rigidity.     Right lower leg: No edema.     Left lower leg: No edema.  Skin:    General: Skin is warm and dry.     Capillary Refill: Capillary refill takes less than 2 seconds.  Neurological:     Mental Status: She is alert. She is disoriented.     GCS: GCS eye subscore is 4. GCS verbal subscore is 5. GCS motor subscore is 6.     Cranial Nerves: Cranial nerves 2-12 are intact.     Sensory: Sensation is intact.     Motor: Motor function is intact.     Coordination: Coordination is intact.     Comments: Pleasantly demented, at apparent baseline per family  Psychiatric:        Mood and Affect: Mood normal.        Behavior: Behavior normal. Behavior is cooperative.     ED Results and Treatments Labs (all labs ordered are listed, but only abnormal results are displayed) Labs Reviewed - No data to display                                                                                                                        Radiology No results found.  Pertinent labs & imaging results that were available during my care of the patient were reviewed by me and considered in my medical decision making (see MDM for details).  Medications Ordered in ED Medications  Tdap (BOOSTRIX) injection 0.5 mL (has no administration in time range)                                                                                                                                      Procedures .Critical Care  Performed by: Sloan Leiter, DO Authorized by: Sloan Leiter, DO   Critical care provider statement:    Critical care time (minutes):  30  Critical care time was exclusive of:  Separately billable procedures and treating other patients   Critical care was necessary to treat or prevent imminent or life-threatening deterioration of the following conditions:  Trauma   Critical care was time spent personally by me on the following activities:  Development of treatment plan with patient or surrogate, discussions with consultants, evaluation of patient's response to treatment, examination of patient, ordering and review of laboratory studies, ordering and review of radiographic studies, ordering and performing treatments and interventions, pulse oximetry, re-evaluation of patient's condition, review of old charts and obtaining history from patient or surrogate   (including critical care time)  Medical Decision Making / ED Course    Medical Decision Making:    ARIAM MOL is a 87 y.o. female with past medical history as below, significant for depression, hypertension, CVA, dementia, anticoagulated who presents to the ED with complaint of fall. The complaint involves an extensive differential diagnosis and also carries with it a high risk of complications and morbidity.  Serious etiology was considered. Ddx includes but is not limited to: Differential diagnoses for head trauma includes subdural hematoma, epidural hematoma, acute concussion, traumatic subarachnoid hemorrhage, cerebral contusions, etc.   Complete initial physical exam performed, notably the patient  was NAD, no complaints.    Reviewed and confirmed nursing documentation for past medical history, family history, social history.  Vital signs reviewed.        Pt with witnessed fall, apparent mechanical in nature, hit back of head  Level 2 trauma  Exam stable o/w  Get CTH CT c/spine    Update tetanus  Abrasion to scalp does not appear to require sutures, bleeding controlled   Signed out to incoming EDP pending imaging and recheck                 Additional history obtained: -Additional history obtained from ems -External records from outside source obtained and reviewed including: Chart review including previous notes, labs, imaging, consultation notes including  Home meds   Lab Tests: na  EKG   EKG Interpretation Date/Time:  Tuesday April 05 2023 15:37:49 EDT Ventricular Rate:  172 PR Interval:    QRS Duration:  101 QT Interval:  228 QTC Calculation: 370 R Axis:   53  Text Interpretation: Atrial fibrillation with rapid V-rate Paired ventricular premature complexes Repolarization abnormality, prob rate related Artifact in lead(s) I II III aVR aVL aVF V1 V2 V3 V4 V5 V6 TECHNICALLY DIFFICULT Confirmed by Tanda Rockers ([id number]) on 04/05/2023 3:43:10 PM         Imaging Studies ordered: I ordered imaging studies including Cth CT cervical >> PENDING    Medicines ordered and prescription drug management: Meds ordered this encounter  Medications   Tdap (BOOSTRIX) injection 0.5 mL    -I have reviewed the patients home medicines and have made adjustments as needed   Consultations Obtained: na   Cardiac Monitoring: Continuous pulse oximetry interpreted by myself, 99% on RA.    Social Determinants of Health:  Diagnosis or treatment significantly limited by social determinants of health: dementia, lives at home   Reevaluation: After the interventions noted above, I reevaluated the patient and found that they have stayed the same  Co morbidities that complicate the patient evaluation  Past Medical History:  Diagnosis Date   Anxiety    Arthritis    Depression    Hypertension    Pre-diabetes    Stroke (HCC)    6 years ago   Thyroid  disease       Dispostion: Disposition decision including need for hospitalization was  considered, and patient disposition pending at time of sign out.    Final Clinical Impression(s) / ED Diagnoses Final diagnoses:  Fall, initial encounter        Sloan Leiter, DO 04/05/23 1738

## 2023-04-05 NOTE — Discharge Instructions (Addendum)
Based on the events which brought you to the ER today, it is possible that you may have a concussion. A concussion occurs when there is a blow to the head or body, with enough force to shake the brain and disrupt how the brain functions. You may experience symptoms such as headaches, sensitivity to light/noise, dizziness, cognitive slowing, difficulty concentrating / remembering, trouble sleeping and drowsiness. These symptoms may last anywhere from hours/days to potentially weeks/months. While these symptoms are very frustrating and perhaps debilitating, it is important that you remember that they will improve over time. Everyone has a different rate of recovery; it is difficult to predict when your symptoms will resolve. In order to allow for your brain to heal after the injury, we recommend that you see your primary physician or a physician knowledgeable in concussion management. We also advise you to let your body and brain rest: avoid physical activities (sports, gym, and exercise) and reduce cognitive demands (reading, texting, TV watching, computer use, video games, etc). School attendance, after-school activities and work may need to be modified to avoid increasing symptoms. We recommend against driving until until all symptoms have resolved. You should take 650mg  of Acetaminophen (Tylenol) every 4 hours as needed for pain control; however, taking anti-inflammatory medication (Motrin/Advil/Ibuprofen) is not advised. Come back to the ER right away if you are having repeated episodes of vomiting, severe/worsening headache/dizziness or any other symptom that alarms you. We recommended that someone stay with you for the next 24 hours to monitor for these worrisome symptoms.

## 2023-04-05 NOTE — ED Provider Notes (Signed)
87 yo female w/ dementia presenting with mechanical fall, on eliquis Pending CT imaging and discharge  Physical Exam  BP (!) 146/74 (BP Location: Left Arm)   Pulse (!) 58   Temp 97.9 F (36.6 C) (Oral)   Resp 16   SpO2 98%   Physical Exam  Procedures  Procedures  ED Course / MDM    Medical Decision Making Amount and/or Complexity of Data Reviewed Radiology: ordered.  Risk Prescription drug management.   CT imaging reviewed with no emergent findings.  Patient is at baseline mental status according to her granddaughter who is her caretaker and POA at bedside.  Granddaughter is comfortable taking her home.  Patient is stable for discharge       Terald Sleeper, MD 04/05/23 [phone]

## 2023-04-05 NOTE — ED Triage Notes (Signed)
Patient arrives by EMS from home with c/o fall.   `Patient had mechanical fall, while walking with walker, patient tripped and fell backwards, bumped back of head, no LOC.  Patient is on eliquis.   Fall witnessed by granddaughter.     EMS VITALS 160/76 HR 60 97% RA CBG 140

## 2023-04-06 DIAGNOSIS — W19XXXA Unspecified fall, initial encounter: Secondary | ICD-10-CM | POA: Diagnosis not present

## 2023-04-06 DIAGNOSIS — S0093XD Contusion of unspecified part of head, subsequent encounter: Secondary | ICD-10-CM | POA: Diagnosis not present

## 2023-06-08 DIAGNOSIS — I82401 Acute embolism and thrombosis of unspecified deep veins of right lower extremity: Secondary | ICD-10-CM | POA: Diagnosis not present

## 2023-06-08 DIAGNOSIS — Z1331 Encounter for screening for depression: Secondary | ICD-10-CM | POA: Diagnosis not present

## 2023-06-08 DIAGNOSIS — E559 Vitamin D deficiency, unspecified: Secondary | ICD-10-CM | POA: Diagnosis not present

## 2023-06-08 DIAGNOSIS — E538 Deficiency of other specified B group vitamins: Secondary | ICD-10-CM | POA: Diagnosis not present

## 2023-06-08 DIAGNOSIS — E039 Hypothyroidism, unspecified: Secondary | ICD-10-CM | POA: Diagnosis not present

## 2023-06-08 DIAGNOSIS — N1831 Chronic kidney disease, stage 3a: Secondary | ICD-10-CM | POA: Diagnosis not present

## 2023-06-08 DIAGNOSIS — Z23 Encounter for immunization: Secondary | ICD-10-CM | POA: Diagnosis not present

## 2023-06-08 DIAGNOSIS — R7303 Prediabetes: Secondary | ICD-10-CM | POA: Diagnosis not present

## 2023-06-08 DIAGNOSIS — Z0000 Encounter for general adult medical examination without abnormal findings: Secondary | ICD-10-CM | POA: Diagnosis not present

## 2023-06-08 DIAGNOSIS — I10 Essential (primary) hypertension: Secondary | ICD-10-CM | POA: Diagnosis not present

## 2023-06-08 DIAGNOSIS — E782 Mixed hyperlipidemia: Secondary | ICD-10-CM | POA: Diagnosis not present

## 2023-07-30 ENCOUNTER — Other Ambulatory Visit: Payer: Self-pay | Admitting: Neurology

## 2023-08-15 ENCOUNTER — Other Ambulatory Visit: Payer: Self-pay | Admitting: Neurology

## 2023-08-24 ENCOUNTER — Observation Stay (HOSPITAL_COMMUNITY)
Admission: EM | Admit: 2023-08-24 | Discharge: 2023-08-31 | Disposition: A | Discharge status: Skilled Nursing Facility | Attending: Emergency Medicine | Admitting: Emergency Medicine

## 2023-08-24 ENCOUNTER — Emergency Department (HOSPITAL_COMMUNITY)

## 2023-08-24 ENCOUNTER — Other Ambulatory Visit: Payer: Self-pay

## 2023-08-24 ENCOUNTER — Encounter (HOSPITAL_COMMUNITY): Payer: Self-pay

## 2023-08-24 DIAGNOSIS — S329XXA Fracture of unspecified parts of lumbosacral spine and pelvis, initial encounter for closed fracture: Secondary | ICD-10-CM | POA: Diagnosis not present

## 2023-08-24 DIAGNOSIS — I700 Atherosclerosis of aorta: Secondary | ICD-10-CM | POA: Diagnosis not present

## 2023-08-24 DIAGNOSIS — Z8673 Personal history of transient ischemic attack (TIA), and cerebral infarction without residual deficits: Secondary | ICD-10-CM | POA: Insufficient documentation

## 2023-08-24 DIAGNOSIS — R0902 Hypoxemia: Secondary | ICD-10-CM | POA: Diagnosis not present

## 2023-08-24 DIAGNOSIS — W19XXXA Unspecified fall, initial encounter: Principal | ICD-10-CM | POA: Insufficient documentation

## 2023-08-24 DIAGNOSIS — Z96642 Presence of left artificial hip joint: Secondary | ICD-10-CM | POA: Diagnosis not present

## 2023-08-24 DIAGNOSIS — S79919A Unspecified injury of unspecified hip, initial encounter: Secondary | ICD-10-CM | POA: Diagnosis not present

## 2023-08-24 DIAGNOSIS — Z7901 Long term (current) use of anticoagulants: Secondary | ICD-10-CM | POA: Diagnosis not present

## 2023-08-24 DIAGNOSIS — Z86711 Personal history of pulmonary embolism: Secondary | ICD-10-CM | POA: Diagnosis not present

## 2023-08-24 DIAGNOSIS — E039 Hypothyroidism, unspecified: Secondary | ICD-10-CM | POA: Diagnosis not present

## 2023-08-24 DIAGNOSIS — E785 Hyperlipidemia, unspecified: Secondary | ICD-10-CM | POA: Insufficient documentation

## 2023-08-24 DIAGNOSIS — H9190 Unspecified hearing loss, unspecified ear: Secondary | ICD-10-CM

## 2023-08-24 DIAGNOSIS — F0390 Unspecified dementia without behavioral disturbance: Secondary | ICD-10-CM | POA: Insufficient documentation

## 2023-08-24 DIAGNOSIS — M25572 Pain in left ankle and joints of left foot: Secondary | ICD-10-CM | POA: Diagnosis not present

## 2023-08-24 DIAGNOSIS — M25559 Pain in unspecified hip: Secondary | ICD-10-CM | POA: Diagnosis not present

## 2023-08-24 DIAGNOSIS — F32A Depression, unspecified: Secondary | ICD-10-CM | POA: Diagnosis present

## 2023-08-24 DIAGNOSIS — S0990XA Unspecified injury of head, initial encounter: Secondary | ICD-10-CM | POA: Diagnosis not present

## 2023-08-24 DIAGNOSIS — M1611 Unilateral primary osteoarthritis, right hip: Secondary | ICD-10-CM | POA: Diagnosis not present

## 2023-08-24 DIAGNOSIS — F03918 Unspecified dementia, unspecified severity, with other behavioral disturbance: Secondary | ICD-10-CM | POA: Diagnosis present

## 2023-08-24 DIAGNOSIS — Z79899 Other long term (current) drug therapy: Secondary | ICD-10-CM | POA: Diagnosis not present

## 2023-08-24 DIAGNOSIS — I6782 Cerebral ischemia: Secondary | ICD-10-CM | POA: Diagnosis not present

## 2023-08-24 DIAGNOSIS — M25551 Pain in right hip: Secondary | ICD-10-CM | POA: Diagnosis not present

## 2023-08-24 DIAGNOSIS — I10 Essential (primary) hypertension: Secondary | ICD-10-CM | POA: Diagnosis not present

## 2023-08-24 DIAGNOSIS — I959 Hypotension, unspecified: Secondary | ICD-10-CM | POA: Diagnosis not present

## 2023-08-24 LAB — BASIC METABOLIC PANEL
Anion gap: 8 (ref 5–15)
BUN: 21 mg/dL (ref 8–23)
CO2: 28 mmol/L (ref 22–32)
Calcium: 8.8 mg/dL — ABNORMAL LOW (ref 8.9–10.3)
Chloride: 104 mmol/L (ref 98–111)
Creatinine, Ser: 1 mg/dL (ref 0.44–1.00)
GFR, Estimated: 55 mL/min — ABNORMAL LOW (ref >60)
Glucose, Bld: 115 mg/dL — ABNORMAL HIGH (ref 70–99)
Potassium: 4.1 mmol/L (ref 3.5–5.1)
Sodium: 140 mmol/L (ref 135–145)

## 2023-08-24 LAB — CBC WITH DIFFERENTIAL/PLATELET
Abs Immature Granulocytes: 0.03 10*3/uL (ref 0.00–0.07)
Basophils Absolute: 0 10*3/uL (ref 0.0–0.1)
Basophils Relative: 1 %
Eosinophils Absolute: 0.2 10*3/uL (ref 0.0–0.5)
Eosinophils Relative: 2 %
HCT: 37.6 % (ref 36.0–46.0)
Hemoglobin: 11.8 g/dL — ABNORMAL LOW (ref 12.0–15.0)
Immature Granulocytes: 0 %
Lymphocytes Relative: 13 %
Lymphs Abs: 1 10*3/uL (ref 0.7–4.0)
MCH: 27.9 pg (ref 26.0–34.0)
MCHC: 31.4 g/dL (ref 30.0–36.0)
MCV: 88.9 fL (ref 80.0–100.0)
Monocytes Absolute: 0.5 10*3/uL (ref 0.1–1.0)
Monocytes Relative: 6 %
Neutro Abs: 6.1 10*3/uL (ref 1.7–7.7)
Neutrophils Relative %: 78 %
Platelets: 177 10*3/uL (ref 150–400)
RBC: 4.23 MIL/uL (ref 3.87–5.11)
RDW: 14.1 % (ref 11.5–15.5)
WBC: 7.8 10*3/uL (ref 4.0–10.5)
nRBC: 0 % (ref 0.0–0.2)

## 2023-08-24 LAB — TYPE AND SCREEN
ABO/RH(D): A POS
Antibody Screen: NEGATIVE

## 2023-08-24 LAB — PROTIME-INR
INR: 1.2 (ref 0.8–1.2)
Prothrombin Time: 14.9 s (ref 11.4–15.2)

## 2023-08-24 MED ORDER — MEMANTINE HCL 10 MG PO TABS
5.0000 mg | ORAL_TABLET | Freq: Every morning | ORAL | Status: DC
  Administered 2023-08-25 – 2023-08-31 (×7): 5 mg via ORAL
  Filled 2023-08-24 (×7): qty 1

## 2023-08-24 MED ORDER — GABAPENTIN 100 MG PO CAPS
200.0000 mg | ORAL_CAPSULE | Freq: Every day | ORAL | Status: DC
  Administered 2023-08-24 – 2023-08-30 (×7): 200 mg via ORAL
  Filled 2023-08-24 (×7): qty 2

## 2023-08-24 MED ORDER — LEVOTHYROXINE SODIUM 75 MCG PO TABS
75.0000 ug | ORAL_TABLET | Freq: Every day | ORAL | Status: DC
  Administered 2023-08-25 – 2023-08-31 (×7): 75 ug via ORAL
  Filled 2023-08-24 (×7): qty 1

## 2023-08-24 MED ORDER — METOPROLOL SUCCINATE ER 25 MG PO TB24
25.0000 mg | ORAL_TABLET | Freq: Every day | ORAL | Status: DC
Start: 2023-08-25 — End: 2023-08-31
  Administered 2023-08-25 – 2023-08-31 (×7): 25 mg via ORAL
  Filled 2023-08-24 (×7): qty 1

## 2023-08-24 MED ORDER — LISINOPRIL 10 MG PO TABS
5.0000 mg | ORAL_TABLET | Freq: Every day | ORAL | Status: DC
  Administered 2023-08-25 – 2023-08-29 (×5): 5 mg via ORAL
  Filled 2023-08-24 (×6): qty 1

## 2023-08-24 MED ORDER — VITAMIN B-12 1000 MCG PO TABS
1000.0000 ug | ORAL_TABLET | Freq: Every day | ORAL | Status: DC
  Administered 2023-08-24 – 2023-08-31 (×8): 1000 ug via ORAL
  Filled 2023-08-24 (×8): qty 1

## 2023-08-24 MED ORDER — VITAMIN D3 25 MCG (1000 UT) PO CAPS: 1000.0000 [IU] | ORAL_CAPSULE | Freq: Every day | ORAL | Status: DC

## 2023-08-24 MED ORDER — APIXABAN 2.5 MG PO TABS
2.5000 mg | ORAL_TABLET | Freq: Two times a day (BID) | ORAL | Status: DC
  Administered 2023-08-24 – 2023-08-31 (×14): 2.5 mg via ORAL
  Filled 2023-08-24 (×14): qty 1

## 2023-08-24 MED ORDER — ONDANSETRON HCL 4 MG/2ML IJ SOLN
4.0000 mg | Freq: Four times a day (QID) | INTRAMUSCULAR | Status: DC | PRN
  Administered 2023-08-28: 4 mg via INTRAVENOUS

## 2023-08-24 MED ORDER — OLANZAPINE 10 MG PO TABS: 5.0000 mg | ORAL_TABLET | Freq: Every day | ORAL | Status: DC

## 2023-08-24 MED ORDER — KETOROLAC TROMETHAMINE 15 MG/ML IJ SOLN
15.0000 mg | Freq: Four times a day (QID) | INTRAMUSCULAR | Status: AC | PRN
  Administered 2023-08-24 – 2023-08-25 (×2): 15 mg via INTRAVENOUS
  Filled 2023-08-24 (×2): qty 1

## 2023-08-24 MED ORDER — SIMVASTATIN 20 MG PO TABS
20.0000 mg | ORAL_TABLET | Freq: Every day | ORAL | Status: DC
  Administered 2023-08-24 – 2023-08-30 (×7): 20 mg via ORAL
  Filled 2023-08-24 (×7): qty 1

## 2023-08-24 MED ORDER — OLANZAPINE 10 MG PO TABS
10.0000 mg | ORAL_TABLET | Freq: Every day | ORAL | Status: DC
  Administered 2023-08-24 – 2023-08-30 (×7): 10 mg via ORAL
  Filled 2023-08-24 (×7): qty 1

## 2023-08-24 MED ORDER — SENNOSIDES-DOCUSATE SODIUM 8.6-50 MG PO TABS
1.0000 | ORAL_TABLET | Freq: Every evening | ORAL | Status: DC | PRN
  Administered 2023-08-27: 1 via ORAL
  Filled 2023-08-24: qty 1

## 2023-08-24 MED ORDER — SERTRALINE HCL 50 MG PO TABS
50.0000 mg | ORAL_TABLET | Freq: Every day | ORAL | Status: DC
  Administered 2023-08-24 – 2023-08-31 (×8): 50 mg via ORAL
  Filled 2023-08-24 (×8): qty 1

## 2023-08-24 MED ORDER — ACETAMINOPHEN 325 MG PO TABS: 650.0000 mg | ORAL_TABLET | Freq: Four times a day (QID) | ORAL | Status: DC | PRN

## 2023-08-24 MED ORDER — OLANZAPINE 10 MG PO TABS
5.0000 mg | ORAL_TABLET | Freq: Every morning | ORAL | Status: DC
Start: 2023-08-25 — End: 2023-08-31
  Administered 2023-08-25 – 2023-08-31 (×7): 5 mg via ORAL
  Filled 2023-08-24 (×7): qty 1

## 2023-08-24 MED ORDER — TRAZODONE HCL 50 MG PO TABS
100.0000 mg | ORAL_TABLET | Freq: Every day | ORAL | Status: DC
  Administered 2023-08-24 – 2023-08-30 (×7): 100 mg via ORAL
  Filled 2023-08-24 (×7): qty 2

## 2023-08-24 MED ORDER — ONDANSETRON HCL 4 MG PO TABS: 4.0000 mg | ORAL_TABLET | Freq: Four times a day (QID) | ORAL | Status: DC | PRN

## 2023-08-24 MED ORDER — HYDROMORPHONE HCL 1 MG/ML IJ SOLN
0.5000 mg | Freq: Once | INTRAMUSCULAR | Status: AC
  Administered 2023-08-24: 0.5 mg via INTRAVENOUS
  Filled 2023-08-24: qty 1

## 2023-08-24 MED ORDER — OXYCODONE HCL 5 MG PO TABS: 5.0000 mg | ORAL_TABLET | ORAL | Status: DC | PRN

## 2023-08-24 MED ORDER — ACETAMINOPHEN 650 MG RE SUPP: 650.0000 mg | Freq: Four times a day (QID) | RECTAL | Status: DC | PRN

## 2023-08-24 NOTE — Hospital Course (Addendum)
 88 year old female with history of dementia, hypothyroidism, hyperlipidemia, HTN, on Eliquis who had a fall 08/23/2023 and having difficulty getting out of bed and pain in her right hip so brought to the ED for further evaluation and found to have mildly displaced and comminuted fracture of the left pubic body and admitted for for pain management as fracture deemed to be stable and weight bearing as tolerated.  In ED- CXR/CT head no acute finding, CT head showed designated volume loss chronic small vessel ischemic changes X-ray, pelvis and CT pelvis> Mildly displaced and comminuted fracture of the left pubic body extending to the junction of the superior ramus. Fracture extends to the upper aspect of the pubic symphysis.Left hip arthroplasty without complication.Lucency through the anterior superior endplate of L4 vertebral body with slight superior endplate compression deformity is age indeterminate. Question of soft tissue thickening adjacent to the L4 fracture. Recommend correlation with focal tenderness -Patient had acute renal failure with slight bump in creatinine given IV fluids with improvement.  She had urine retention PRN multiple-oh and subsequently Foley catheter placed. Seen by PT OT, recommending skilled nursing facility and awaiting placement.

## 2023-08-24 NOTE — ED Notes (Signed)
 Patient transported to X-ray

## 2023-08-24 NOTE — H&P (Signed)
 History and Physical    [name] ZOX:[id number] DOB: 1936-06-13 DOA: 08/24/2023  PCP: Camie Patience, FNP   Patient coming from: Home. At baseline aaox1, able to walk.  Chief Complaint  Patient presents with   Fall   Hip Pain     HPI:88 year old female with history of dementia, hypothyroidism, hyperlipidemia, HTN, on Eliquis who had a fall 08/23/2023 and having difficulty getting out of bed and pain in her right hip so brought to the ED for further evaluation. Patient  poor historian due to dementia. Admission requested for pain management as fracture deemed to be stable and weight bearing as tolerated. Granddaughter  POA/Caregiver is at bedside. Patient is aaox1 only. No other complaints. Currently pain is controlled.  In the ED: Vitals stable afebrile not hypoxic.  Labs reviewed fairly stable CBC BMP Chest x-ray/CT head no acute finding, CT head showed designated volume loss chronic small vessel ischemic changes X-ray, pelvis and CT pelvis> Mildly displaced and comminuted fracture of the left pubic body extending to the junction of the superior ramus. Fracture extends to the upper aspect of the pubic symphysis.Left hip arthroplasty without complication.Lucency through the anterior superior endplate of L4 vertebral body with slight superior endplate compression deformity is age indeterminate. Question of soft tissue thickening adjacent to the L4 fracture. Recommend correlation with focal tenderness   Assessment/Plan  Left Pelvic fracture Fall at home: Patient will be admitted for pain control.  PT OT will be consulted  Essential hypertension: BP stable resume home meds after med rec  History of pulmonary embolism: On Eliquis will resume  Acquired hypothyroidism: Continue home Synthroid  Dementia with behavioral disturbance Depression Hearing loss: Resume home home memantine and Zoloft trazodone, zyprexa, vitamin supplement and keep on delirium precaution fall  precaution  HLD: Resume statin   There is no height or weight on file to calculate BMI.   Severity of Illness: The appropriate patient status for this patient is OBSERVATION. Observation status is judged to be reasonable and necessary in order to provide the required intensity of service to ensure the patient's safety. The patient's presenting symptoms, physical exam findings, and initial radiographic and laboratory data in the context of their medical condition is felt to place them at decreased risk for further clinical deterioration. Furthermore, it is anticipated that the patient will be medically stable for discharge from the hospital within 2 midnights of admission.    DVT prophylaxis: apixaban (ELIQUIS) tablet 2.5 mg Start: 08/24/23 2200 apixaban (ELIQUIS) tablet 2.5 mg   Code Status:   Code Status: Full Code Family Communication: Admission, patients condition and plan of care including tests being ordered have been discussed with the patient and POA who indicate understanding and agree with the plan and Code Status.  Consults called:  NONE  Review of Systems: All systems were reviewed and were negative except as mentioned in HPI above. Negative for fever Negative for chest pain Negative for shortness of breath  Past Medical History:  Diagnosis Date   Anxiety    Arthritis    Depression    Hypertension    Pre-diabetes    Stroke (HCC)    6 years ago   Thyroid disease     Past Surgical History:  Procedure Laterality Date   ABDOMINAL HYSTERECTOMY     CHOLECYSTECTOMY     MULTIPLE TOOTH EXTRACTIONS     ROTATOR CUFF REPAIR Left    TOTAL HIP ARTHROPLASTY Left 10/22/2020   Procedure: LEFT TOTAL HIP ARTHROPLASTY ANTERIOR APPROACH;  Surgeon: Gean Birchwood, MD;  Location: [facility];  Service: Orthopedics;  Laterality: Left;     reports that she has never smoked. She has never used smokeless tobacco. She reports that she does not drink alcohol and does not use drugs.  Allergies   Allergen Reactions   Donepezil Other (See Comments)    Hallucinations and sleep disturbances   Escitalopram Oxalate Other (See Comments)    Reaction type/severity unknown   Hydrochlorothiazide Other (See Comments)    Reaction type/severity unknown     History reviewed. No pertinent family history.   Prior to Admission medications   Medication Sig Start Date End Date Taking? Authorizing Provider  lisinopril (ZESTRIL) 5 MG tablet Take 5 mg by mouth daily. 08/21/23  Yes [provider]  Cholecalciferol (VITAMIN D3) 25 MCG (1000 UT) CAPS Take 1,000 Units by mouth daily. 05/29/21   [provider]  ELIQUIS 2.5 MG TABS tablet Take 2.5 mg by mouth 2 (two) times daily. 12/27/22   [provider]  gabapentin (NEURONTIN) 100 MG capsule Take 1 cap in AM, 2 caps in PM Patient taking differently: Take 100-200 mg by mouth See admin instructions. Take 1 capsule (100mg ) in the morning and 2 capsules (200mg ) in the evening for a total daily dose of 300mg  11/02/22   Van Clines, MD  levothyroxine (SYNTHROID) 75 MCG tablet Take 75 mcg by mouth daily before breakfast.    [provider]  memantine (NAMENDA) 5 MG tablet TAKE 1 TABLET EVERY MORNING 08/02/23   Van Clines, MD  metoprolol succinate (TOPROL-XL) 25 MG 24 hr tablet Take 25 mg by mouth daily. 08/11/22   [provider]  OLANZapine (ZYPREXA) 10 MG tablet TAKE 1/2 TABLET IN THE MORNING AND TAKE 1 TABLET AT BEDTIME 08/18/23   Van Clines, MD  sertraline (ZOLOFT) 50 MG tablet Take 1 tablet (50 mg total) by mouth daily. 11/02/22   Van Clines, MD  simvastatin (ZOCOR) 20 MG tablet Take 20 mg by mouth at bedtime.     [provider]  traZODone (DESYREL) 50 MG tablet TAKE 2 TABLETS AT BEDTIME 08/02/23   Van Clines, MD  vitamin B-12 (CYANOCOBALAMIN) 1000 MCG tablet Take 1,000 mcg by mouth daily. 05/29/21   [provider]    Physical Exam: Vitals:   08/24/23 1500 08/24/23 1600  08/24/23 1700 08/24/23 1750  BP: (!) 122/59 120/71 125/60   Pulse: 77 84 83   Resp: 15 13 13    Temp:    97.6 F (36.4 C)  TempSrc:    Oral  SpO2: 95%  93%     General exam: AAO to self and family, NAD, weak appearing. HEENT:Oral mucosa moist, Ear/Nose WNL grossly, dentition normal. Respiratory system: bilaterally clear,no wheezing or crackles,no use of accessory muscle Cardiovascular system: S1 & S2 +, No JVD,. Gastrointestinal system: Abdomen soft, NT,ND, BS+ Nervous System:Alert, awake, moving extremities and grossly nonfocal Extremities: No edema, distal peripheral pulses palpable.  Skin: No rashes,no icterus. MSK: Normal muscle bulk,tone, power   Labs on Admission: I have personally reviewed following labs and imaging studies  CBC: Recent Labs  Lab 08/24/23 1311  WBC 7.8  NEUTROABS 6.1  HGB 11.8*  HCT 37.6  MCV 88.9  PLT 177   Basic Metabolic Panel: Recent Labs  Lab 08/24/23 1311  NA 140  K 4.1  CL 104  CO2 28  GLUCOSE 115*  BUN 21  CREATININE 1.00  CALCIUM 8.8*   Recent Labs  Lab  08/24/23 1311  INR 1.2   No results for input(s): "TSH", "T4TOTAL", "FREET4", "T3FREE", "THYROIDAB" in the last 72 hours. Urine analysis:    Component Value Date/Time   COLORURINE YELLOW 09/05/2022 1932   APPEARANCEUR CLEAR 09/05/2022 1932   LABSPEC 1.029 09/05/2022 1932   PHURINE 5.0 09/05/2022 1932   GLUCOSEU NEGATIVE 09/05/2022 1932   HGBUR NEGATIVE 09/05/2022 1932   BILIRUBINUR NEGATIVE 09/05/2022 1932   KETONESUR NEGATIVE 09/05/2022 1932   PROTEINUR NEGATIVE 09/05/2022 1932   NITRITE NEGATIVE 09/05/2022 1932   LEUKOCYTESUR NEGATIVE 09/05/2022 1932    Radiological Exams on Admission: CT PELVIS WO CONTRAST Result Date: 08/24/2023 CLINICAL DATA:  Hip trauma, fracture suspected, xray done EXAM: CT PELVIS WITHOUT CONTRAST TECHNIQUE: Multidetector CT imaging of the pelvis was performed following the standard protocol without intravenous contrast. RADIATION DOSE  REDUCTION: This exam was performed according to the departmental dose-optimization program which includes automated exposure control, adjustment of the mA and/or kV according to patient size and/or use of iterative reconstruction technique. COMPARISON:  Radiograph earlier today FINDINGS: Bones/Joint/Cartilage Mildly displaced and comminuted fracture of the left pubic body extending to the junction of the superior ramus. Fracture extends to the upper aspect of the pubic symphysis. No definite inferior ramus fracture. There is no associated sacral fracture. Left hip arthroplasty is intact, no periprosthetic fracture or lucency. No acute fracture of the right hip or hemipelvis. Moderate right hip osteoarthritis with joint space narrowing and spurring. Lucency through the anterior superior endplate of L4 vertebral body with slight superior endplate compression deformity is age indeterminate. Ligaments Suboptimally assessed by CT. Muscles and Tendons Heterogeneous enlargement of the left obturator muscles likely related to adjacent fracture. Soft tissues Colonic diverticulosis. Aortic atherosclerosis. Question of soft tissue thickening adjacent to the L4 fracture. IMPRESSION: 1. Mildly displaced and comminuted fracture of the left pubic body extending to the junction of the superior ramus. Fracture extends to the upper aspect of the pubic symphysis. 2. Left hip arthroplasty without complication. 3. Lucency through the anterior superior endplate of L4 vertebral body with slight superior endplate compression deformity is age indeterminate. Question of soft tissue thickening adjacent to the L4 fracture. Recommend correlation with focal tenderness. Electronically Signed   By: Narda Rutherford M.D.   On: 08/24/2023 16:34   DG Hip Unilat With Pelvis 2-3 Views Right Result Date: 08/24/2023 CLINICAL DATA:  Hip pain after fall. EXAM: DG HIP (WITH OR WITHOUT PELVIS) 2-3V RIGHT COMPARISON:  None Available. FINDINGS: Cortical  irregularity about the left pubic body at the superior ramus body junction suspicious for minimally displaced fracture. No definite inferior ramus fracture. No fracture of the right hip. No hip dislocation. Moderate right hip osteoarthritis with joint space narrowing and spurring. No pubic symphyseal or sacroiliac diastasis. Previous left hip arthroplasty. IMPRESSION: Cortical irregularity about the left pubic body at the superior ramus junction, suspicious for fracture. Electronically Signed   By: Narda Rutherford M.D.   On: 08/24/2023 16:26   DG Chest 1 View Result Date: 08/24/2023 CLINICAL DATA:  Fall with hip pain. EXAM: CHEST  1 VIEW COMPARISON:  Chest radiograph 09/05/2022 FINDINGS: Patient is rotated. The cardiomediastinal contours are normal. Minimal aortic atherosclerosis. Pulmonary vasculature is normal. No consolidation, pleural effusion, or pneumothorax. No acute osseous abnormalities are seen. IMPRESSION: No active disease. Electronically Signed   By: Narda Rutherford M.D.   On: 08/24/2023 16:24   CT HEAD WO CONTRAST Result Date: 08/24/2023 CLINICAL DATA:  Head trauma, minor.  Witnessed fall yesterday. EXAM: CT HEAD  WITHOUT CONTRAST TECHNIQUE: Contiguous axial images were obtained from the base of the skull through the vertex without intravenous contrast. RADIATION DOSE REDUCTION: This exam was performed according to the departmental dose-optimization program which includes automated exposure control, adjustment of the mA and/or kV according to patient size and/or use of iterative reconstruction technique. COMPARISON:  04/05/2023 FINDINGS: Brain: Age related volume loss. Chronic small-vessel ischemic change of the cerebral hemispheric white matter. No sign of acute infarction, mass lesion, hemorrhage, hydrocephalus or extra-axial collection. Vascular: There is atherosclerotic calcification of the major vessels at the base of the brain. Skull: Negative Sinuses/Orbits: Clear/normal Other: None  IMPRESSION: No acute or traumatic finding. Age related volume loss. Chronic small-vessel ischemic change of the cerebral hemispheric white matter. Electronically Signed   By: Paulina Fusi M.D.   On: 08/24/2023 14:36   Lanae Boast MD [facility]  If 7PM-7AM, please contact night-coverage [email or web address]  08/24/2023, 6:03 PM

## 2023-08-24 NOTE — ED Notes (Signed)
 Patient transported to CT

## 2023-08-24 NOTE — ED Provider Notes (Signed)
 [place] EMERGENCY DEPARTMENT AT [facility] Provider Note   CSN: [id number] Arrival date & time: 08/24/23  1304     History  Chief Complaint  Patient presents with   Fall   Hip Pain    [name] is a 88 y.o. female.  She has a history of dementia, level 5 caveat.  She is on Eliquis.  She had a fall yesterday.  Today was having difficulty getting out of bed with pain in her right hip and so ambulance was called.  She received some pain medication by EMS.  Patient herself denies any complaints.  The history is provided by the patient and the EMS personnel.  Fall This is a new problem. The current episode started yesterday. The problem has not changed since onset.The symptoms are aggravated by standing. She has tried rest for the symptoms. The treatment provided no relief.  Hip Pain       Home Medications Prior to Admission medications   Medication Sig Start Date End Date Taking? Authorizing Provider  Cholecalciferol (VITAMIN D3) 25 MCG (1000 UT) CAPS Take 1,000 Units by mouth daily. 05/29/21   [provider]  ELIQUIS 2.5 MG TABS tablet Take 2.5 mg by mouth 2 (two) times daily. 12/27/22   [provider]  gabapentin (NEURONTIN) 100 MG capsule Take 1 cap in AM, 2 caps in PM Patient taking differently: Take 100-200 mg by mouth See admin instructions. Take 1 capsule (100mg ) in the morning and 2 capsules (200mg ) in the evening for a total daily dose of 300mg  11/02/22   Van Clines, MD  levothyroxine (SYNTHROID) 75 MCG tablet Take 75 mcg by mouth daily before breakfast.    [provider]  memantine (NAMENDA) 5 MG tablet TAKE 1 TABLET EVERY MORNING 08/02/23   Van Clines, MD  metoprolol succinate (TOPROL-XL) 25 MG 24 hr tablet Take 25 mg by mouth daily. 08/11/22   [provider]  OLANZapine (ZYPREXA) 10 MG tablet TAKE 1/2 TABLET IN THE MORNING AND TAKE 1 TABLET AT BEDTIME 08/18/23   Van Clines, MD  sertraline (ZOLOFT) 50 MG  tablet Take 1 tablet (50 mg total) by mouth daily. 11/02/22   Van Clines, MD  simvastatin (ZOCOR) 20 MG tablet Take 20 mg by mouth at bedtime.     [provider]  traZODone (DESYREL) 50 MG tablet TAKE 2 TABLETS AT BEDTIME 08/02/23   Van Clines, MD  vitamin B-12 (CYANOCOBALAMIN) 1000 MCG tablet Take 1,000 mcg by mouth daily. 05/29/21   [provider]      Allergies    Donepezil, Escitalopram oxalate, and Hydrochlorothiazide    Review of Systems   Review of Systems  Physical Exam Updated Vital Signs BP (!) 122/59   Pulse 77   Temp 98.7 F (37.1 C) (Oral)   Resp 15   SpO2 95%  Physical Exam Vitals and nursing note reviewed.  Constitutional:      General: She is not in acute distress.    Appearance: Normal appearance. She is well-developed.  HENT:     Head: Normocephalic and atraumatic.  Eyes:     Conjunctiva/sclera: Conjunctivae normal.  Cardiovascular:     Rate and Rhythm: Normal rate and regular rhythm.     Heart sounds: No murmur heard. Pulmonary:     Effort: Pulmonary effort is normal. No respiratory distress.     Breath sounds: Normal breath sounds.  Abdominal:     Palpations: Abdomen is soft.  Tenderness: There is no abdominal tenderness. There is no guarding or rebound.  Musculoskeletal:        General: Tenderness present. No deformity.     Cervical back: Neck supple.     Comments: Is full range of motion of upper extremities without any pain or limitation.  She has some tenderness of her right hip.  Nontender knee and ankle.  Full range of motion of left lower extremity without any pain or limitations.  Distal pulses and motor intact.  Skin:    General: Skin is warm and dry.     Capillary Refill: Capillary refill takes less than 2 seconds.  Neurological:     General: No focal deficit present.     Mental Status: She is alert.     Motor: No weakness.     ED Results / Procedures / Treatments   Labs (all labs ordered are listed, but  only abnormal results are displayed) Labs Reviewed  BASIC METABOLIC PANEL - Abnormal; Notable for the following components:      Result Value   Glucose, Bld 115 (*)    Calcium 8.8 (*)    GFR, Estimated 55 (*)    All other components within normal limits  CBC WITH DIFFERENTIAL/PLATELET - Abnormal; Notable for the following components:   Hemoglobin 11.8 (*)    All other components within normal limits  PROTIME-INR  TYPE AND SCREEN    EKG EKG Interpretation Date/Time:  Wednesday August 24 2023 13:17:00 EST Ventricular Rate:  79 PR Interval:  169 QRS Duration:  96 QT Interval:  393 QTC Calculation: 451 R Axis:   64  Text Interpretation: Sinus rhythm Atrial premature complex Consider left atrial enlargement No significant change since last tracing Confirmed by Meridee Score [phone]) on 08/24/2023 1:18:39 PM  Radiology CT PELVIS WO CONTRAST Result Date: 08/24/2023 CLINICAL DATA:  Hip trauma, fracture suspected, xray done EXAM: CT PELVIS WITHOUT CONTRAST TECHNIQUE: Multidetector CT imaging of the pelvis was performed following the standard protocol without intravenous contrast. RADIATION DOSE REDUCTION: This exam was performed according to the departmental dose-optimization program which includes automated exposure control, adjustment of the mA and/or kV according to patient size and/or use of iterative reconstruction technique. COMPARISON:  Radiograph earlier today FINDINGS: Bones/Joint/Cartilage Mildly displaced and comminuted fracture of the left pubic body extending to the junction of the superior ramus. Fracture extends to the upper aspect of the pubic symphysis. No definite inferior ramus fracture. There is no associated sacral fracture. Left hip arthroplasty is intact, no periprosthetic fracture or lucency. No acute fracture of the right hip or hemipelvis. Moderate right hip osteoarthritis with joint space narrowing and spurring. Lucency through the anterior superior endplate of L4  vertebral body with slight superior endplate compression deformity is age indeterminate. Ligaments Suboptimally assessed by CT. Muscles and Tendons Heterogeneous enlargement of the left obturator muscles likely related to adjacent fracture. Soft tissues Colonic diverticulosis. Aortic atherosclerosis. Question of soft tissue thickening adjacent to the L4 fracture. IMPRESSION: 1. Mildly displaced and comminuted fracture of the left pubic body extending to the junction of the superior ramus. Fracture extends to the upper aspect of the pubic symphysis. 2. Left hip arthroplasty without complication. 3. Lucency through the anterior superior endplate of L4 vertebral body with slight superior endplate compression deformity is age indeterminate. Question of soft tissue thickening adjacent to the L4 fracture. Recommend correlation with focal tenderness. Electronically Signed   By: Narda Rutherford M.D.   On: 08/24/2023 16:34   DG Hip Unilat  With Pelvis 2-3 Views Right Result Date: 08/24/2023 CLINICAL DATA:  Hip pain after fall. EXAM: DG HIP (WITH OR WITHOUT PELVIS) 2-3V RIGHT COMPARISON:  None Available. FINDINGS: Cortical irregularity about the left pubic body at the superior ramus body junction suspicious for minimally displaced fracture. No definite inferior ramus fracture. No fracture of the right hip. No hip dislocation. Moderate right hip osteoarthritis with joint space narrowing and spurring. No pubic symphyseal or sacroiliac diastasis. Previous left hip arthroplasty. IMPRESSION: Cortical irregularity about the left pubic body at the superior ramus junction, suspicious for fracture. Electronically Signed   By: Narda Rutherford M.D.   On: 08/24/2023 16:26   DG Chest 1 View Result Date: 08/24/2023 CLINICAL DATA:  Fall with hip pain. EXAM: CHEST  1 VIEW COMPARISON:  Chest radiograph 09/05/2022 FINDINGS: Patient is rotated. The cardiomediastinal contours are normal. Minimal aortic atherosclerosis. Pulmonary vasculature  is normal. No consolidation, pleural effusion, or pneumothorax. No acute osseous abnormalities are seen. IMPRESSION: No active disease. Electronically Signed   By: Narda Rutherford M.D.   On: 08/24/2023 16:24   CT HEAD WO CONTRAST Result Date: 08/24/2023 CLINICAL DATA:  Head trauma, minor.  Witnessed fall yesterday. EXAM: CT HEAD WITHOUT CONTRAST TECHNIQUE: Contiguous axial images were obtained from the base of the skull through the vertex without intravenous contrast. RADIATION DOSE REDUCTION: This exam was performed according to the departmental dose-optimization program which includes automated exposure control, adjustment of the mA and/or kV according to patient size and/or use of iterative reconstruction technique. COMPARISON:  04/05/2023 FINDINGS: Brain: Age related volume loss. Chronic small-vessel ischemic change of the cerebral hemispheric white matter. No sign of acute infarction, mass lesion, hemorrhage, hydrocephalus or extra-axial collection. Vascular: There is atherosclerotic calcification of the major vessels at the base of the brain. Skull: Negative Sinuses/Orbits: Clear/normal Other: None IMPRESSION: No acute or traumatic finding. Age related volume loss. Chronic small-vessel ischemic change of the cerebral hemispheric white matter. Electronically Signed   By: Paulina Fusi M.D.   On: 08/24/2023 14:36    Procedures Procedures    Medications Ordered in ED Medications  HYDROmorphone (DILAUDID) injection 0.5 mg (0.5 mg Intravenous Given 08/24/23 1525)    ED Course/ Medical Decision Making/ A&P Clinical Course as of 08/24/23 1636  Wed Aug 24, 2023  1349 Patient's chest x-ray and pelvis right hip x-ray do not show any obvious fracture.  She has a left hip replacement that looks located.  Have put her in for a CT pelvis to look for occult fracture.  Awaiting radiology reading. [MB]  1431 Patient's granddaughter is here now who is also her caregiver.  I updated her on current status.   Awaiting final readings of imaging. [MB]  1505 Received sign out from Dr. Charm Barges pending CT pelvis, re-assessment, presenting with R hip pain after fall last night  [WS]    Clinical Course User Index [MB] Terrilee Files, MD [WS] Lonell Grandchild, MD                                 Medical Decision Making Amount and/or Complexity of Data Reviewed Labs: ordered. Radiology: ordered.  Risk Prescription drug management.   This patient complains of fall hip pain; this involves an extensive number of treatment Options and is a complaint that carries with it a high risk of complications and morbidity. The differential includes fracture, contusion, dislocation, failure to thrive, inability to ambulate  I ordered,  reviewed and interpreted labs, which included CBC with normal white count, stable low hemoglobin, chemistries unremarkable I ordered imaging studies which included x-rays of chest and pelvis, CT of head and pelvis and I independently    visualized and interpreted imaging which showed possible left pubic fracture.  Awaiting final readings. Additional history obtained from EMS and patient's granddaughter Previous records obtained and reviewed in epic no recent admissions  Cardiac monitoring reviewed, normal sinus rhythm Social determinants considered, no significant barriers Critical Interventions: None  After the interventions stated above, I reevaluated the patient and found patient to be fairly asymptomatic while nonweightbearing Admission and further testing considered, her care is signed out to Dr. Rhoderick Moody to follow-up on results of CT imaging.  Possible discharge home if adequate pain control and can safely be managed.         Final Clinical Impression(s) / ED Diagnoses Final diagnoses:  Fall, initial encounter    Rx / DC Orders ED Discharge Orders     None         Terrilee Files, MD 08/24/23 [phone]

## 2023-08-24 NOTE — ED Triage Notes (Signed)
 Pt BIB [facility] from home d/t a witnessed fall yesterday when her legs gave out. Family reports she did not hit her head, that she fell back straight on her bottom & is c/o Rt hip pain. 108/53, 76 bpm, 88% on RA then 97% on 3L O2 via n/c, CBG 123, Temp 97. Received 50 mcg Fent in 22g PIV in Rt wrist.

## 2023-08-25 DIAGNOSIS — S329XXA Fracture of unspecified parts of lumbosacral spine and pelvis, initial encounter for closed fracture: Secondary | ICD-10-CM | POA: Diagnosis not present

## 2023-08-25 LAB — BASIC METABOLIC PANEL
Anion gap: 8 (ref 5–15)
BUN: 33 mg/dL — ABNORMAL HIGH (ref 8–23)
CO2: 25 mmol/L (ref 22–32)
Calcium: 8.3 mg/dL — ABNORMAL LOW (ref 8.9–10.3)
Chloride: 105 mmol/L (ref 98–111)
Creatinine, Ser: 1.37 mg/dL — ABNORMAL HIGH (ref 0.44–1.00)
GFR, Estimated: 38 mL/min — ABNORMAL LOW (ref >60)
Glucose, Bld: 118 mg/dL — ABNORMAL HIGH (ref 70–99)
Potassium: 4.8 mmol/L (ref 3.5–5.1)
Sodium: 138 mmol/L (ref 135–145)

## 2023-08-25 LAB — CBC
HCT: 34.6 % — ABNORMAL LOW (ref 36.0–46.0)
Hemoglobin: 10.8 g/dL — ABNORMAL LOW (ref 12.0–15.0)
MCH: 27.6 pg (ref 26.0–34.0)
MCHC: 31.2 g/dL (ref 30.0–36.0)
MCV: 88.5 fL (ref 80.0–100.0)
Platelets: 156 10*3/uL (ref 150–400)
RBC: 3.91 MIL/uL (ref 3.87–5.11)
RDW: 14.3 % (ref 11.5–15.5)
WBC: 7.3 10*3/uL (ref 4.0–10.5)
nRBC: 0 % (ref 0.0–0.2)

## 2023-08-25 MED ORDER — LACTATED RINGERS IV SOLN: INTRAVENOUS | Status: AC

## 2023-08-25 NOTE — Care Management Obs Status (Signed)
 MEDICARE OBSERVATION STATUS NOTIFICATION   Patient Details  Name: [name] MRN: [id number] Date of Birth: Feb 16, 1936   Medicare Observation Status Notification Given:  Yes    Sherilyn Banker 08/25/2023, 1:09 PM

## 2023-08-25 NOTE — Evaluation (Signed)
 Occupational Therapy Evaluation Patient Details Name: [name] MRN: [id number] DOB: Oct 08, 1935 Today's Date: 08/25/2023   History of Present Illness   Patient is a 88 year old with fall and hip pain.  Found a mildly displaced and comminuted fracture left pubic body extending to junction of the superior ramus. History of dementia, hypothyroidism, hyperlipidemia, HTN     Clinical Impressions Pt c/o no pain at rest, during bed mobility holds L hip/thigh. Pt poor historian, speaks in tangents, not able to respond to questions appropriately. Pt's granddaughter present during session, lives with Pt and is available 24/7, works from home and able to assist with transfers, ADLs, and IADLs as needed. Pt is able to follow simple commands and participate with activities. Pt mod A for bed mobility and able to sit EOB with good balance. Mod A x2 for STS, not able to take steps or pivot well at this time. Pt set up/supervision for UB ADLs, max A for LB ADLs. Recommending postacute rehab <3hrs/day to maximize functional strength and independence with transfers and ADLs, can likely flip to home if progresses to at least min A for stand pivot transfer to w/c. Would benefit from w/c for return home to maximize OOB activity and mobility in home setting. Will continue to follow acutely to progress as able.      If plan is discharge home, recommend the following:   Two people to help with walking and/or transfers;A lot of help with bathing/dressing/bathroom;Assistance with cooking/housework;Assist for transportation;Help with stairs or ramp for entrance;Supervision due to cognitive status;Direct supervision/assist for medications management     Functional Status Assessment   Patient has had a recent decline in their functional status and demonstrates the ability to make significant improvements in function in a reasonable and predictable amount of time.     Equipment Recommendations   Wheelchair  (measurements OT);Wheelchair cushion (measurements OT)     Recommendations for Other Services         Precautions/Restrictions   Precautions Precautions: Fall Recall of Precautions/Restrictions: Impaired Restrictions Weight Bearing Restrictions Per Provider Order: No     Mobility Bed Mobility Overal bed mobility: Needs Assistance Bed Mobility: Supine to Sit, Sit to Supine     Supine to sit: Mod assist Sit to supine: Mod assist, +2 for physical assistance   General bed mobility comments: assistance for LE and trunk support to sit upright. intermittent increased support required for return to bed. cues for technique    Transfers Overall transfer level: Needs assistance Equipment used: Rolling walker (2 wheels) Transfers: Sit to/from Stand Sit to Stand: Mod assist, +2 physical assistance           General transfer comment: 3 standing bouts performed. Mod A +2 for first 2 stands,      Balance Overall balance assessment: Needs assistance Sitting-balance support: Feet supported Sitting balance-Leahy Scale: Fair     Standing balance support: Bilateral upper extremity supported, Reliant on assistive device for balance Standing balance-Leahy Scale: Poor Standing balance comment: external support required to maintain standing balance                           ADL either performed or assessed with clinical judgement   ADL Overall ADL's : Needs assistance/impaired Eating/Feeding: Set up;Supervision/ safety;Sitting   Grooming: Set up;Supervision/safety;Sitting   Upper Body Bathing: Minimal assistance;Cueing for sequencing;Sitting   Lower Body Bathing: Maximal assistance;Sitting/lateral leans   Upper Body Dressing : Minimal assistance;Sitting;Cueing for sequencing  Lower Body Dressing: Maximal assistance;Cueing for sequencing;Sit to/from stand   Toilet Transfer: Moderate assistance;+2 for physical assistance;Rolling walker (2 wheels);BSC/3in1    Toileting- Clothing Manipulation and Hygiene: Maximal assistance;Sitting/lateral lean         General ADL Comments: Pt close to baseline for UB ADLs, set up to min A for cueing and participation/sequencing, requires max A for LB ADLs due to pain at L hip, also c/o RLE pain     Vision         Perception         Praxis         Pertinent Vitals/Pain Pain Assessment Pain Assessment: Faces Faces Pain Scale: Hurts little more Pain Location: left hip, right hip Pain Descriptors / Indicators: Discomfort, Grimacing Pain Intervention(s): Monitored during session     Extremity/Trunk Assessment Upper Extremity Assessment Upper Extremity Assessment: Overall WFL for tasks assessed   Lower Extremity Assessment Lower Extremity Assessment: Defer to PT evaluation       Communication Communication Communication: No apparent difficulties   Cognition Arousal: Alert Behavior During Therapy: WFL for tasks assessed/performed Cognition: History of cognitive impairments             OT - Cognition Comments: history of advanced dementia, speaks in tangents, able to follow simple commands and participate, not able to answer questions appropriately.                 Following commands: Impaired Following commands impaired: Follows one step commands with increased time     Cueing  General Comments   Cueing Techniques: Verbal cues;Tactile cues      Exercises     Shoulder Instructions      Home Living Family/patient expects to be discharged to:: Private residence Living Arrangements: Other relatives (granddaughter) Available Help at Discharge: Family;Available 24 hours/day Type of Home: House Home Access: Level entry     Home Layout: Multi-level (lives on second level with stair lift) Alternate Level Stairs-Number of Steps:  (stair lift)   Bathroom Shower/Tub: Walk-in shower;Door         Home Equipment: Agricultural consultant (2 wheels);BSC/3in1;Lift chair    Additional Comments: granddaughter provided information. Lives with granddaughter who is available 24/7, works from home, has motion camera at night that will go off if Pt gets OOB unassisted.      Prior Functioning/Environment Prior Level of Function : Needs assist             Mobility Comments: patient is usually Mod I with 2 wheeled walker. granddaughter reports patient frequently forgets to use the rolling walker. assistance required for getting on the/off the stair lift at home ADLs Comments: Assistance for bathing, dressing, occasionally for pericare. She can feed herself. Supervision is needed for cognition with motion activated cameras in patient's room.    OT Problem List: Decreased strength;Decreased range of motion;Decreased activity tolerance;Impaired balance (sitting and/or standing);Decreased cognition;Decreased safety awareness;Pain   OT Treatment/Interventions: Self-care/ADL training;Therapeutic exercise;Energy conservation;DME and/or AE instruction;Therapeutic activities;Balance training;Patient/family education      OT Goals(Current goals can be found in the care plan section)   Acute Rehab OT Goals Patient Stated Goal: unable to participate in goal setting OT Goal Formulation: With family Time For Goal Achievement: 09/08/23 Potential to Achieve Goals: Good   OT Frequency:  Min 2X/week    Co-evaluation   Reason for Co-Treatment: To address functional/ADL transfers PT goals addressed during session: Mobility/safety with mobility        AM-PAC OT "6 Clicks" Daily Activity  Outcome Measure Help from another person eating meals?: A Little Help from another person taking care of personal grooming?: A Little Help from another person toileting, which includes using toliet, bedpan, or urinal?: A Lot Help from another person bathing (including washing, rinsing, drying)?: A Lot Help from another person to put on and taking off regular upper body clothing?: A  Little Help from another person to put on and taking off regular lower body clothing?: A Lot 6 Click Score: 15   End of Session Equipment Utilized During Treatment: Gait belt;Rolling walker (2 wheels) Nurse Communication: Mobility status  Activity Tolerance: Patient tolerated treatment well Patient left: in bed;with call bell/phone within reach;with bed alarm set;with family/visitor present  OT Visit Diagnosis: Unsteadiness on feet (R26.81);Other abnormalities of gait and mobility (R26.89);Muscle weakness (generalized) (M62.81);Other symptoms and signs involving cognitive function;Pain Pain - Right/Left: Left Pain - part of body: Hip                Time: 1610-9604 OT Time Calculation (min): 33 min Charges:  OT General Charges $OT Visit: 1 Visit OT Evaluation $OT Eval Moderate Complexity: 1 [address], OTR/L   Alexis Goodell 08/25/2023, 1:50 PM

## 2023-08-25 NOTE — Evaluation (Signed)
 Physical Therapy Evaluation Patient Details Name: SHAKTI FLEER MRN: [id number] DOB: 1936/03/21 Today's Date: 08/25/2023  History of Present Illness  Patient is a 88 year old with fall and hip pain.  Found a mildly displaced and comminuted fracture left pubic body extending to junction of the superior ramus. History of dementia, hypothyroidism, hyperlipidemia, HTN  Clinical Impression  Patient is agreeable to PT evaluation. She is oriented to her first name only but is cooperative throughout session and able to follow single step commands consistently with increased time. She lives with her granddaughter who is the caregiver. The patient can walk with a rolling walker at baseline and needs supervision for cognition and assistance with some ADLs at home.  Today the patient requires assistance with bed mobility. Initial +2 person assistance required for transfers but she is able to stand with one person assistance with the third stand. Unable to take more than 1-2 small side steps to the right with activity tolerance limited by fatigue. She does seem to have pain in both hips at times. The granddaughter would like to speak with other family members about discharge plan. Consider rehabilitation < 3 hours/day if family is unable to provide increased physical assistance at home. If family takes her home, would recommend a wheelchair and home health PT along with initial 24/7 assistance.       If plan is discharge home, recommend the following: A lot of help with walking and/or transfers;A lot of help with bathing/dressing/bathroom;Assistance with cooking/housework;Assist for transportation;Help with stairs or ramp for entrance;Supervision due to cognitive status   Can travel by private vehicle   No    Equipment Recommendations Wheelchair (measurements PT);Wheelchair cushion (measurements PT)  Recommendations for Other Services       Functional Status Assessment Patient has had a recent decline in  their functional status and demonstrates the ability to make significant improvements in function in a reasonable and predictable amount of time.     Precautions / Restrictions Precautions Precautions: Fall Recall of Precautions/Restrictions: Impaired Restrictions Weight Bearing Restrictions Per Provider Order: No      Mobility  Bed Mobility Overal bed mobility: Needs Assistance Bed Mobility: Supine to Sit, Sit to Supine     Supine to sit: Mod assist Sit to supine: Mod assist, +2 for physical assistance   General bed mobility comments: assistance for LE and trunk support to sit upright. intermittent increased support required for return to bed. cues for technique    Transfers Overall transfer level: Needs assistance Equipment used: Rolling walker (2 wheels) Transfers: Sit to/from Stand Sit to Stand: Mod assist, +2 physical assistance           General transfer comment: 3 standing bouts performed. Mod A +2 for first 2 stands,    Ambulation/Gait             Pre-gait activities: weight shifting faciliation provided in preparation for ambulation.  patient was able to take 1-2 small side steps with difficulty using rolling walker with Mod A +2 person    Stairs            Wheelchair Mobility     Tilt Bed    Modified Rankin (Stroke Patients Only)       Balance Overall balance assessment: Needs assistance Sitting-balance support: Feet supported Sitting balance-Leahy Scale: Fair     Standing balance support: Bilateral upper extremity supported, Reliant on assistive device for balance Standing balance-Leahy Scale: Poor Standing balance comment: external support required to maintain standing balance  Pertinent Vitals/Pain Pain Assessment Pain Assessment: Faces Faces Pain Scale: Hurts little more Pain Location: left hip, right hip Pain Descriptors / Indicators: Discomfort, Grimacing Pain Intervention(s):  Limited activity within patient's tolerance, Monitored during session, Repositioned    Home Living Family/patient expects to be discharged to:: Private residence Living Arrangements: Other relatives (granddaughter) Available Help at Discharge: Family;Available 24 hours/day Type of Home: House Home Access: Level entry     Alternate Level Stairs-Number of Steps:  (stair lift) Home Layout: Multi-level (lives on second level with stair lift) Home Equipment: Agricultural consultant (2 wheels);BSC/3in1;Lift chair Additional Comments: granddaughter provided information    Prior Function Prior Level of Function : Needs assist             Mobility Comments: patient is usually Mod I with 2 wheeled walker. granddaughter reports patient frequently forgets to use the rolling walker. assistance required for getting on the/off the stair lift at home ADLs Comments: Assistance for bathing, dressing, occasionally for pericare. She can feed herself. Supervision is needed for cognition with motion activated cameras in patient's room.     Extremity/Trunk Assessment   Upper Extremity Assessment Upper Extremity Assessment: Defer to OT evaluation    Lower Extremity Assessment Lower Extremity Assessment: Generalized weakness       Communication   Communication Communication: No apparent difficulties    Cognition Arousal: Alert Behavior During Therapy: WFL for tasks assessed/performed   PT - Cognitive impairments: History of cognitive impairments                       PT - Cognition Comments: Patient is oriented to self only. She is confused but cooperative with history of dementia Following commands: Impaired Following commands impaired: Follows one step commands with increased time     Cueing Cueing Techniques: Verbal cues, Tactile cues     General Comments      Exercises     Assessment/Plan    PT Assessment Patient needs continued PT services  PT Problem List Decreased  strength;Decreased range of motion;Decreased activity tolerance;Decreased balance;Decreased mobility;Decreased cognition;Decreased knowledge of use of DME;Decreased safety awareness;Pain       PT Treatment Interventions DME instruction;Gait training;Functional mobility training;Therapeutic activities;Therapeutic exercise;Balance training;Neuromuscular re-education;Cognitive remediation;Patient/family education;Wheelchair mobility training    PT Goals (Current goals can be found in the Care Plan section)  Acute Rehab PT Goals Patient Stated Goal: granddaughter would like rehab versus home with PT PT Goal Formulation: With family Time For Goal Achievement: 09/08/23 Potential to Achieve Goals: Fair    Frequency Min 2X/week     Co-evaluation PT/OT/SLP Co-Evaluation/Treatment: Yes Reason for Co-Treatment: To address functional/ADL transfers PT goals addressed during session: Mobility/safety with mobility         AM-PAC PT "6 Clicks" Mobility  Outcome Measure Help needed turning from your back to your side while in a flat bed without using bedrails?: A Lot Help needed moving from lying on your back to sitting on the side of a flat bed without using bedrails?: A Lot Help needed moving to and from a bed to a chair (including a wheelchair)?: A Lot Help needed standing up from a chair using your arms (e.g., wheelchair or bedside chair)?: A Lot Help needed to walk in hospital room?: Total   6 Click Score: 9    End of Session Equipment Utilized During Treatment: Gait belt Activity Tolerance: Patient limited by fatigue Patient left: in bed;with call bell/phone within reach;with bed alarm set;with family/visitor present Nurse Communication: Mobility  status PT Visit Diagnosis: Other abnormalities of gait and mobility (R26.89);Difficulty in walking, not elsewhere classified (R26.2)    Time: 1610-9604 PT Time Calculation (min) (ACUTE ONLY): 33 min   Charges:   PT Evaluation $PT Eval  Moderate Complexity: 1 Mod   PT General Charges $$ ACUTE PT VISIT: 1 Visit         Donna Bernard, PT, MPT   Ina Homes 08/25/2023, 12:57 PM

## 2023-08-25 NOTE — Plan of Care (Signed)

## 2023-08-25 NOTE — Progress Notes (Signed)
 Patient is becoming restless and removed her telemetry leads and IV at this time. IV team was paged and successfully inserted a new IV at this time. Patient has an active order for Intravenous fluids (LR @100 ). IV team informed me that this is her third time removing her IV and recommended the use of soft mittens at this time to prevent future removal of the new IV. Patient immediately attempted to remove the new IV and telemetry leads once more. Soft mittens were placed at this time with appropriate finger width in between the mitten and patient. Patient tolerated mitten placement well and is resting in bed. Vitals are stable, telemetry leads remain on, and IV access is still in place.

## 2023-08-25 NOTE — Progress Notes (Signed)
 PROGRESS NOTE [name]  WUJ:[id number] DOB: 07-09-1935 DOA: 08/24/2023 PCP: Camie Patience, FNP  Brief Narrative/Hospital Course: 88 year old female with history of dementia, hypothyroidism, hyperlipidemia, HTN, on Eliquis who had a fall 08/23/2023 and having difficulty getting out of bed and pain in her right hip so brought to the ED for further evaluation and found to have mildly displaced and comminuted fracture of the left pubic body and admitted for for pain management as fracture deemed to be stable and weight bearing as tolerated.  Significant imaging/procedures : chest x-ray/CT head no acute finding, CT head showed designated volume loss chronic small vessel ischemic changes X-ray, pelvis and CT pelvis> Mildly displaced and comminuted fracture of the left pubic body extending to the junction of the superior ramus. Fracture extends to the upper aspect of the pubic symphysis.Left hip arthroplasty without complication.Lucency through the anterior superior endplate of L4 vertebral body with slight superior endplate compression deformity is age indeterminate. Question of soft tissue thickening adjacent to the L4 fracture. Recommend correlation with focal tenderness    Subjective: Seen and examined this am Alert awake oriented to self Increased about location people  and situation with baseline dementia   Assessment and Plan: Principal Problem:   Pelvic fracture (HCC) Active Problems:   Essential hypertension   History of pulmonary embolism   Acquired hypothyroidism   Dementia with behavioral disturbance   Depression   Hearing loss   Left Pelvic fracture Fall at home: Patient had a fall at home imaging shows mildly displaced and comminuted fracture of the left pubic body, discussed with Earney Hamburg from orthopedics plan is to pain control PT OT and weightbearing as tolerated as advised by ED  Continue pain control PT OT input pending   AKI: creat up 1.37 form baseline 1.0ckd  3a.Added ivf, monitor labs.   Essential hypertension: BP well controlled. Cont home meds toprol, lisinopril.   History of pulmonary embolism: Cont home Eliquis   Acquired hypothyroidism: Cont her synthroid   Dementia with behavioral disturbance Depression Hearing loss: Mood stable pleasantly confused. Cont memantine and Zoloft trazodone, zyprexa, vitamin supplement and keep on delirium precaution fall precaution   HLD: Cont statins.  DVT prophylaxis: apixaban (ELIQUIS) tablet 2.5 mg Start: 08/24/23 2200 Code Status:   Code Status: Full Code Family Communication: plan of care discussed with patient/none at bedside. Patient status is: Remains hospitalized because of severity of illness and for need for IVF for AKI Level of care: Telemetry Medical   Dispo: The patient is from: Home w/ granddaughter.            Anticipated disposition: TBD  Objective: Vitals last 24 hrs: Vitals:   08/24/23 2036 08/24/23 2337 08/25/23 0453 08/25/23 0719  BP: 129/60 (!) 147/96 139/61 (!) 152/71  Pulse: 87 84 84 96  Resp: 18 16 16 16   Temp: 98.6 F (37 C) 98.6 F (37 C) 98.4 F (36.9 C) 98.7 F (37.1 C)  TempSrc:  Oral    SpO2: 94% 94% 94% 95%   Weight change:   Physical Examination: General exam: alert awake, HEENT:Oral mucosa moist, Ear/Nose WNL grossly Respiratory system: Bilaterally clear BS,no use of accessory muscle Cardiovascular system: S1 & S2 +, No JVD. Gastrointestinal system: Abdomen soft,NT,ND, BS+ Nervous System: Alert, oriented x 1, moving all extremities,and following commands. Extremities: LE edema neg,distal peripheral pulses palpable and warm.  Skin: No rashes,no icterus. MSK: Normal muscle bulk,tone, power   Medications reviewed:  Scheduled Meds:  apixaban  2.5 mg Oral BID  cyanocobalamin  1,000 mcg Oral Daily   gabapentin  200 mg Oral QHS   levothyroxine  75 mcg Oral QAC breakfast   lisinopril  5 mg Oral Daily   memantine  5 mg Oral q morning   metoprolol  succinate  25 mg Oral Daily   OLANZapine  10 mg Oral QHS   OLANZapine  5 mg Oral q morning   sertraline  50 mg Oral Daily   simvastatin  20 mg Oral QHS   traZODone  100 mg Oral QHS   Continuous Infusions:  lactated ringers 100 mL/hr at 08/25/23 1017      Diet Order             Diet regular Room service appropriate? Yes; Fluid consistency: Thin  Diet effective now                   Intake/Output Summary (Last 24 hours) at 08/25/2023 1143 Last data filed at 08/25/2023 0900 Gross per 24 hour  Intake 480 ml  Output 1000 ml  Net -520 ml   Net IO Since Admission: -520 mL [08/25/23 1143]  Wt Readings from Last 3 Encounters:  11/02/22 71.2 kg  09/29/22 72.1 kg  09/05/22 71.4 kg     Unresulted Labs (From admission, onward)    None     Data Reviewed: I have personally reviewed following labs and imaging studies CBC: Recent Labs  Lab 08/24/23 1311 08/25/23 0614  WBC 7.8 7.3  NEUTROABS 6.1  --   HGB 11.8* 10.8*  HCT 37.6 34.6*  MCV 88.9 88.5  PLT 177 156   Basic Metabolic Panel:  Recent Labs  Lab 08/24/23 1311 08/25/23 0614  NA 140 138  K 4.1 4.8  CL 104 105  CO2 28 25  GLUCOSE 115* 118*  BUN 21 33*  CREATININE 1.00 1.37*  CALCIUM 8.8* 8.3*   GFR: CrCl cannot be calculated (Unknown ideal weight.). Liver Function Tests:  Recent Labs  Lab 08/24/23 1311  INR 1.2  Sepsis Labs: No results for input(s): "PROCALCITON", "LATICACIDVEN" in the last 168 hours. No results found for this or any previous visit (from the past 240 hours).  Antimicrobials/Microbiology: Anti-infectives (From admission, onward)    None         Component Value Date/Time   SDES BLOOD LEFT ARM 08/27/2021 1720   SPECREQUEST  08/27/2021 1720    BOTTLES DRAWN AEROBIC AND ANAEROBIC Blood Culture results may not be optimal due to an inadequate volume of blood received in culture bottles   CULT  08/27/2021 1720    NO GROWTH 5 DAYS Performed at [facility], [address],  [place], [place] [address]    REPTSTATUS 09/01/2021 FINAL 08/27/2021 1720     Radiology Studies: CT PELVIS WO CONTRAST Result Date: 08/24/2023 CLINICAL DATA:  Hip trauma, fracture suspected, xray done EXAM: CT PELVIS WITHOUT CONTRAST TECHNIQUE: Multidetector CT imaging of the pelvis was performed following the standard protocol without intravenous contrast. RADIATION DOSE REDUCTION: This exam was performed according to the departmental dose-optimization program which includes automated exposure control, adjustment of the mA and/or kV according to patient size and/or use of iterative reconstruction technique. COMPARISON:  Radiograph earlier today FINDINGS: Bones/Joint/Cartilage Mildly displaced and comminuted fracture of the left pubic body extending to the junction of the superior ramus. Fracture extends to the upper aspect of the pubic symphysis. No definite inferior ramus fracture. There is no associated sacral fracture. Left hip arthroplasty is intact, no periprosthetic fracture or  lucency. No acute fracture of the right hip or hemipelvis. Moderate right hip osteoarthritis with joint space narrowing and spurring. Lucency through the anterior superior endplate of L4 vertebral body with slight superior endplate compression deformity is age indeterminate. Ligaments Suboptimally assessed by CT. Muscles and Tendons Heterogeneous enlargement of the left obturator muscles likely related to adjacent fracture. Soft tissues Colonic diverticulosis. Aortic atherosclerosis. Question of soft tissue thickening adjacent to the L4 fracture. IMPRESSION: 1. Mildly displaced and comminuted fracture of the left pubic body extending to the junction of the superior ramus. Fracture extends to the upper aspect of the pubic symphysis. 2. Left hip arthroplasty without complication. 3. Lucency through the anterior superior endplate of L4 vertebral body with slight superior endplate compression deformity is age indeterminate. Question of  soft tissue thickening adjacent to the L4 fracture. Recommend correlation with focal tenderness. Electronically Signed   By: Narda Rutherford M.D.   On: 08/24/2023 16:34   DG Hip Unilat With Pelvis 2-3 Views Right Result Date: 08/24/2023 CLINICAL DATA:  Hip pain after fall. EXAM: DG HIP (WITH OR WITHOUT PELVIS) 2-3V RIGHT COMPARISON:  None Available. FINDINGS: Cortical irregularity about the left pubic body at the superior ramus body junction suspicious for minimally displaced fracture. No definite inferior ramus fracture. No fracture of the right hip. No hip dislocation. Moderate right hip osteoarthritis with joint space narrowing and spurring. No pubic symphyseal or sacroiliac diastasis. Previous left hip arthroplasty. IMPRESSION: Cortical irregularity about the left pubic body at the superior ramus junction, suspicious for fracture. Electronically Signed   By: Narda Rutherford M.D.   On: 08/24/2023 16:26   DG Chest 1 View Result Date: 08/24/2023 CLINICAL DATA:  Fall with hip pain. EXAM: CHEST  1 VIEW COMPARISON:  Chest radiograph 09/05/2022 FINDINGS: Patient is rotated. The cardiomediastinal contours are normal. Minimal aortic atherosclerosis. Pulmonary vasculature is normal. No consolidation, pleural effusion, or pneumothorax. No acute osseous abnormalities are seen. IMPRESSION: No active disease. Electronically Signed   By: Narda Rutherford M.D.   On: 08/24/2023 16:24   CT HEAD WO CONTRAST Result Date: 08/24/2023 CLINICAL DATA:  Head trauma, minor.  Witnessed fall yesterday. EXAM: CT HEAD WITHOUT CONTRAST TECHNIQUE: Contiguous axial images were obtained from the base of the skull through the vertex without intravenous contrast. RADIATION DOSE REDUCTION: This exam was performed according to the departmental dose-optimization program which includes automated exposure control, adjustment of the mA and/or kV according to patient size and/or use of iterative reconstruction technique. COMPARISON:  04/05/2023  FINDINGS: Brain: Age related volume loss. Chronic small-vessel ischemic change of the cerebral hemispheric white matter. No sign of acute infarction, mass lesion, hemorrhage, hydrocephalus or extra-axial collection. Vascular: There is atherosclerotic calcification of the major vessels at the base of the brain. Skull: Negative Sinuses/Orbits: Clear/normal Other: None IMPRESSION: No acute or traumatic finding. Age related volume loss. Chronic small-vessel ischemic change of the cerebral hemispheric white matter. Electronically Signed   By: Paulina Fusi M.D.   On: 08/24/2023 14:36     LOS: 0 days   Total time spent in review of labs and imaging, patient evaluation, formulation of plan, documentation and communication with family: 35 minutes  Lanae Boast, MD  [facility]  08/25/2023, 11:43 AM

## 2023-08-26 DIAGNOSIS — S329XXA Fracture of unspecified parts of lumbosacral spine and pelvis, initial encounter for closed fracture: Secondary | ICD-10-CM | POA: Diagnosis not present

## 2023-08-26 LAB — BASIC METABOLIC PANEL
Anion gap: 6 (ref 5–15)
BUN: 33 mg/dL — ABNORMAL HIGH (ref 8–23)
CO2: 24 mmol/L (ref 22–32)
Calcium: 7.6 mg/dL — ABNORMAL LOW (ref 8.9–10.3)
Chloride: 106 mmol/L (ref 98–111)
Creatinine, Ser: 1.03 mg/dL — ABNORMAL HIGH (ref 0.44–1.00)
GFR, Estimated: 53 mL/min — ABNORMAL LOW (ref >60)
Glucose, Bld: 97 mg/dL (ref 70–99)
Potassium: 4.5 mmol/L (ref 3.5–5.1)
Sodium: 136 mmol/L (ref 135–145)

## 2023-08-26 MED ORDER — CHLORHEXIDINE GLUCONATE CLOTH 2 % EX PADS: 6.0000 | MEDICATED_PAD | Freq: Every day | CUTANEOUS | Status: DC

## 2023-08-26 MED ORDER — CHLORHEXIDINE GLUCONATE CLOTH 2 % EX PADS
6.0000 | MEDICATED_PAD | Freq: Every day | CUTANEOUS | Status: DC
  Administered 2023-08-26 – 2023-08-31 (×6): 6 via TOPICAL

## 2023-08-26 MED ORDER — ENSURE ENLIVE PO LIQD
237.0000 mL | Freq: Two times a day (BID) | ORAL | Status: DC
  Administered 2023-08-27 – 2023-08-31 (×9): 237 mL via ORAL

## 2023-08-26 NOTE — Progress Notes (Signed)
 Pt oriented x1, unable to provide information.  CSW left message with listed contact, granddaughter [name].  Daleen Squibb, MSW, LCSW 3/7/202511:39 AM

## 2023-08-26 NOTE — Progress Notes (Signed)
 PROGRESS NOTE [name]  BJY:[id number] DOB: 12-Apr-1936 DOA: 08/24/2023 PCP: Camie Patience, FNP  Brief Narrative/Hospital Course: 88 year old female with history of dementia, hypothyroidism, hyperlipidemia, HTN, on Eliquis who had a fall 08/23/2023 and having difficulty getting out of bed and pain in her right hip so brought to the ED for further evaluation and found to have mildly displaced and comminuted fracture of the left pubic body and admitted for for pain management as fracture deemed to be stable and weight bearing as tolerated.  Significant imaging/procedures : chest x-ray/CT head no acute finding, CT head showed designated volume loss chronic small vessel ischemic changes X-ray, pelvis and CT pelvis> Mildly displaced and comminuted fracture of the left pubic body extending to the junction of the superior ramus. Fracture extends to the upper aspect of the pubic symphysis.Left hip arthroplasty without complication.Lucency through the anterior superior endplate of L4 vertebral body with slight superior endplate compression deformity is age indeterminate. Question of soft tissue thickening adjacent to the L4 fracture. Recommend correlation with focal tenderness    Subjective: Seen and examined this morning.   Resting comfortably she is alert awake oriented to self at baseline dementia  Labs shows improved renal function    Assessment and Plan: Principal Problem:   Pelvic fracture [facility]) Active Problems:   Essential hypertension   History of pulmonary embolism   Acquired hypothyroidism   Dementia with behavioral disturbance   Depression   Hearing loss   Left Pelvic fracture Fall at home: Patient had a fall at home imaging shows mildly displaced and comminuted fracture of the left pubic body, discussed with Earney Hamburg from orthopedics plan is to pain control PT OT and weightbearing as tolerated as advised by ED  Overall stable.  Looking into skilled nursing facility  AKI on  CKD 3a: creat up 1.37 form baseline 1.0 w/ ckd 3a.Added ivf and renal function has improved.   Essential hypertension: BP controlled continue home Toprol, lisinopril.   History of pulmonary embolism: Cont home Eliquis   Acquired hypothyroidism: Cont her synthroid   Dementia with behavioral disturbance Depression Hearing loss: Mood stable pleasantly confused with baseline dementia. ont memantine and Zoloft trazodone, zyprexa, vitamin supplement and keep on delirium precaution fall precaution   HLD: Cont statins.  DVT prophylaxis: apixaban (ELIQUIS) tablet 2.5 mg Start: 08/24/23 2200 Code Status:   Code Status: Full Code Family Communication: plan of care discussed with patient/none at bedside. Patient status is: Remains hospitalized because of severity of illness and for need for IVF for AKI Level of care: Telemetry Medical   Dispo: The patient is from: Home w/ granddaughter.            Anticipated disposition: Awaiting on skilled nursing facility.  Discussed with TOC  Objective: Vitals last 24 hrs: Vitals:   08/25/23 1556 08/25/23 2126 08/26/23 0500 08/26/23 0852  BP:  (!) 147/54 (!) 124/47 (!) 149/82  Pulse: 63 85 70 84  Resp:  17 16 14   Temp: 98.3 F (36.8 C) 98 F (36.7 C) 98.3 F (36.8 C) 98 F (36.7 C)  TempSrc:   Oral Oral  SpO2:  91% 95% 95%  Weight:      Height:       Weight change:   Physical Examination: General exam: alert awake, oriented x1 HEENT:Oral mucosa moist, Ear/Nose WNL grossly Respiratory system: Bilaterally clear BS,no use of accessory muscle Cardiovascular system: S1 & S2 +, No JVD. Gastrointestinal system: Abdomen soft,NT,ND, BS+ Nervous System: Alert, awake, moving all  extremities,and following commands. Extremities: LE edema neg,distal peripheral pulses palpable and warm.  Skin: No rashes,no icterus. MSK: Normal muscle bulk,tone, power   Medications reviewed:  Scheduled Meds:  apixaban  2.5 mg Oral BID   cyanocobalamin  1,000  mcg Oral Daily   gabapentin  200 mg Oral QHS   levothyroxine  75 mcg Oral QAC breakfast   lisinopril  5 mg Oral Daily   memantine  5 mg Oral q morning   metoprolol succinate  25 mg Oral Daily   OLANZapine  10 mg Oral QHS   OLANZapine  5 mg Oral q morning   sertraline  50 mg Oral Daily   simvastatin  20 mg Oral QHS   traZODone  100 mg Oral QHS  Continuous Infusions:    Diet Order             Diet regular Room service appropriate? Yes; Fluid consistency: Thin  Diet effective now                   Intake/Output Summary (Last 24 hours) at 08/26/2023 1052 Last data filed at 08/26/2023 0500 Gross per 24 hour  Intake 1009.87 ml  Output 1150 ml  Net -140.13 ml   Net IO Since Admission: -660.13 mL [08/26/23 1052]  Wt Readings from Last 3 Encounters:  08/25/23 65.3 kg  11/02/22 71.2 kg  09/29/22 72.1 kg     Unresulted Labs (From admission, onward)    None     Data Reviewed: I have personally reviewed following labs and imaging studies CBC: Recent Labs  Lab 08/24/23 1311 08/25/23 0614  WBC 7.8 7.3  NEUTROABS 6.1  --   HGB 11.8* 10.8*  HCT 37.6 34.6*  MCV 88.9 88.5  PLT 177 156   Basic Metabolic Panel:  Recent Labs  Lab 08/24/23 1311 08/25/23 0614 08/26/23 0826  NA 140 138 136  K 4.1 4.8 4.5  CL 104 105 106  CO2 28 25 24   GLUCOSE 115* 118* 97  BUN 21 33* 33*  CREATININE 1.00 1.37* 1.03*  CALCIUM 8.8* 8.3* 7.6*   GFR: Estimated Creatinine Clearance: 33.9 mL/min (A) (by C-G formula based on SCr of 1.03 mg/dL (H)). Liver Function Tests:  Recent Labs  Lab 08/24/23 1311  INR 1.2  Sepsis Labs: No results for input(s): "PROCALCITON", "LATICACIDVEN" in the last 168 hours. No results found for this or any previous visit (from the past 240 hours).  Antimicrobials/Microbiology: Anti-infectives (From admission, onward)    None         Component Value Date/Time   SDES BLOOD LEFT ARM 08/27/2021 1720   SPECREQUEST  08/27/2021 1720    BOTTLES DRAWN AEROBIC  AND ANAEROBIC Blood Culture results may not be optimal due to an inadequate volume of blood received in culture bottles   CULT  08/27/2021 1720    NO GROWTH 5 DAYS Performed at [facility], [address], [place], [place] [address]    REPTSTATUS 09/01/2021 FINAL 08/27/2021 1720     Radiology Studies: CT PELVIS WO CONTRAST Result Date: 08/24/2023 CLINICAL DATA:  Hip trauma, fracture suspected, xray done EXAM: CT PELVIS WITHOUT CONTRAST TECHNIQUE: Multidetector CT imaging of the pelvis was performed following the standard protocol without intravenous contrast. RADIATION DOSE REDUCTION: This exam was performed according to the departmental dose-optimization program which includes automated exposure control, adjustment of the mA and/or kV according to patient size and/or use of iterative reconstruction technique. COMPARISON:  Radiograph earlier today FINDINGS: Bones/Joint/Cartilage Mildly displaced  and comminuted fracture of the left pubic body extending to the junction of the superior ramus. Fracture extends to the upper aspect of the pubic symphysis. No definite inferior ramus fracture. There is no associated sacral fracture. Left hip arthroplasty is intact, no periprosthetic fracture or lucency. No acute fracture of the right hip or hemipelvis. Moderate right hip osteoarthritis with joint space narrowing and spurring. Lucency through the anterior superior endplate of L4 vertebral body with slight superior endplate compression deformity is age indeterminate. Ligaments Suboptimally assessed by CT. Muscles and Tendons Heterogeneous enlargement of the left obturator muscles likely related to adjacent fracture. Soft tissues Colonic diverticulosis. Aortic atherosclerosis. Question of soft tissue thickening adjacent to the L4 fracture. IMPRESSION: 1. Mildly displaced and comminuted fracture of the left pubic body extending to the junction of the superior ramus. Fracture extends to the upper aspect of the  pubic symphysis. 2. Left hip arthroplasty without complication. 3. Lucency through the anterior superior endplate of L4 vertebral body with slight superior endplate compression deformity is age indeterminate. Question of soft tissue thickening adjacent to the L4 fracture. Recommend correlation with focal tenderness. Electronically Signed   By: Narda Rutherford M.D.   On: 08/24/2023 16:34   DG Hip Unilat With Pelvis 2-3 Views Right Result Date: 08/24/2023 CLINICAL DATA:  Hip pain after fall. EXAM: DG HIP (WITH OR WITHOUT PELVIS) 2-3V RIGHT COMPARISON:  None Available. FINDINGS: Cortical irregularity about the left pubic body at the superior ramus body junction suspicious for minimally displaced fracture. No definite inferior ramus fracture. No fracture of the right hip. No hip dislocation. Moderate right hip osteoarthritis with joint space narrowing and spurring. No pubic symphyseal or sacroiliac diastasis. Previous left hip arthroplasty. IMPRESSION: Cortical irregularity about the left pubic body at the superior ramus junction, suspicious for fracture. Electronically Signed   By: Narda Rutherford M.D.   On: 08/24/2023 16:26   DG Chest 1 View Result Date: 08/24/2023 CLINICAL DATA:  Fall with hip pain. EXAM: CHEST  1 VIEW COMPARISON:  Chest radiograph 09/05/2022 FINDINGS: Patient is rotated. The cardiomediastinal contours are normal. Minimal aortic atherosclerosis. Pulmonary vasculature is normal. No consolidation, pleural effusion, or pneumothorax. No acute osseous abnormalities are seen. IMPRESSION: No active disease. Electronically Signed   By: Narda Rutherford M.D.   On: 08/24/2023 16:24   CT HEAD WO CONTRAST Result Date: 08/24/2023 CLINICAL DATA:  Head trauma, minor.  Witnessed fall yesterday. EXAM: CT HEAD WITHOUT CONTRAST TECHNIQUE: Contiguous axial images were obtained from the base of the skull through the vertex without intravenous contrast. RADIATION DOSE REDUCTION: This exam was performed according  to the departmental dose-optimization program which includes automated exposure control, adjustment of the mA and/or kV according to patient size and/or use of iterative reconstruction technique. COMPARISON:  04/05/2023 FINDINGS: Brain: Age related volume loss. Chronic small-vessel ischemic change of the cerebral hemispheric white matter. No sign of acute infarction, mass lesion, hemorrhage, hydrocephalus or extra-axial collection. Vascular: There is atherosclerotic calcification of the major vessels at the base of the brain. Skull: Negative Sinuses/Orbits: Clear/normal Other: None IMPRESSION: No acute or traumatic finding. Age related volume loss. Chronic small-vessel ischemic change of the cerebral hemispheric white matter. Electronically Signed   By: Paulina Fusi M.D.   On: 08/24/2023 14:36     LOS: 0 days   Total time spent in review of labs and imaging, patient evaluation, formulation of plan, documentation and communication with family: 35 minutes  Lanae Boast, MD  [facility]  08/26/2023, 10:52 AM

## 2023-08-26 NOTE — Progress Notes (Signed)
 RE:  [name]       Date of Birth: May 07, 2036      Date:   08/26/23       To Whom It May Concern:  Please be advised that the above-named patient will require a short-term nursing home stay - anticipated 30 days or less for rehabilitation and strengthening.  The plan is for return home.                 MD signature                Date

## 2023-08-26 NOTE — Progress Notes (Signed)
 Mobility Specialist Progress Note:    08/26/23 1000  Mobility  Activity Transferred to/from [facility]  Level of Assistance +2 (takes two people)  Location manager Ambulated (ft) 4 ft  Activity Response Tolerated well  Mobility Referral Yes (+2)  Mobility visit 1 Mobility  Mobility Specialist Start Time (ACUTE ONLY) [id number]  Mobility Specialist Stop Time (ACUTE ONLY) 1015  Mobility Specialist Time Calculation (min) (ACUTE ONLY) 16 min   Pt received in bed and agreeable. Pleasantly confused this date. Able to come EOB w/ minA and perform stand/pivot to [facility] w/ modA+2. Void unsuccessful. Pt left in bed with call bell and all needs met. Bed alarm on.  [place] Earlene Plater Mobility Specialist Please contact via Special educational needs teacher or Rehab office at [phone]

## 2023-08-26 NOTE — TOC Progression Note (Signed)
 Transition of Care [facility]) - Progression Note    Patient Details  Name: [name] MRN: [id number] Date of Birth: Aug 21, 1935  Transition of Care [facility]) CM/SW Contact  Lorri Frederick, LCSW Phone Number: 08/26/2023, 3:59 PM  Clinical Narrative:   CSW spoke with pt grandson [name] [phone]) in room.  He reports pt lives with his sister [name], who is currently out of town, but reachable by phone.  CSW discussed PT recommendation for SNF, Sharia Reeve said he and his sister do want to pursue SNF placement.  Medicare choice document provided, permission given to send out referral in hub.  PASSR requires additional info.  Referral sent out in hub for SNF.    Expected Discharge Plan: Skilled Nursing Facility Barriers to Discharge: Continued Medical Work up, SNF Pending bed offer  Expected Discharge Plan and Services     Post Acute Care Choice: Skilled Nursing Facility Living arrangements for the past 2 months: Single Family Home                                       Social Determinants of Health (SDOH) Interventions SDOH Screenings   Food Insecurity: Patient Unable To Answer (08/25/2023)  Housing: Low Risk  (08/25/2023)  Transportation Needs: Patient Unable To Answer (08/25/2023)  Utilities: Patient Unable To Answer (08/25/2023)  Depression (PHQ2-9): Low Risk  (09/29/2022)  Social Connections: Unknown (08/25/2023)  Tobacco Use: Low Risk  (08/24/2023)    Readmission Risk Interventions    08/31/2021    1:26 PM  Readmission Risk Prevention Plan  Post Dischage Appt Complete  Medication Screening Complete  Transportation Screening Complete

## 2023-08-26 NOTE — [place] FL2 (Signed)
 [place] MEDICAID FL2 LEVEL OF CARE FORM     IDENTIFICATION  Patient Name: [name] Birthdate: June 01, 1936 Sex: female Admission Date (Current Location): 08/24/2023  [facility] and [place] Number:  Producer, television/film/video and Address:  [facility] [place], [address], [place], [place] [address]      Provider Number: [id number]  Attending Physician Name and Address:  Lanae Boast, MD  Relative Name and Phone Number:  Evelena Peat   [phone]    Current Level of Care: Hospital Recommended Level of Care: Skilled Nursing Facility Prior Approval Number:    Date Approved/Denied:   PASRR Number:    Discharge Plan: SNF    Current Diagnoses: Patient Active Problem List   Diagnosis Date Noted   Pelvic fracture (HCC) 08/24/2023   Expressive aphasia 09/05/2022   AKI (acute kidney injury) (HCC) 09/05/2022   Leukocytosis 09/05/2022   History of pulmonary embolism 09/05/2022   CAP (community acquired pneumonia) 08/31/2021   Acute deep vein thrombosis (DVT) of right lower extremity (HCC) 08/31/2021   Sepsis (HCC) 08/28/2021   Hypothyroidism, unspecified 08/28/2021   H/O total hip arthroplasty, left 10/22/2020   Osteoarthritis of left hip 02/06/2020   Anxiety 02/04/2020   CVA (cerebral vascular accident) (HCC) 02/04/2020   Depression 02/04/2020   Essential hypertension 02/04/2020   HSV-2 (herpes simplex virus 2) infection 02/04/2020   Insomnia 02/04/2020   Vitamin D deficiency 02/04/2020   Constipation 01/09/2020   Hearing loss 01/09/2020   Hx of completed stroke 01/09/2020   Hx of herpes simplex infection 01/09/2020   Mixed dyslipidemia 01/09/2020   Normocytic anemia 01/09/2020   Prediabetes 01/09/2020   Primary osteoarthritis involving multiple joints 01/09/2020   Venous insufficiency 01/09/2020   Vitamin B12 deficiency 01/09/2020   Dementia with behavioral disturbance 01/08/2020   Delusional thoughts (HCC) 01/06/2020   Aggressive  behavior    Hallucinations    Acquired hypothyroidism 03/18/2014   Mixed hyperlipidemia 03/18/2014    Orientation RESPIRATION BLADDER Height & Weight     Self  Normal Indwelling catheter, Incontinent Weight: 143 lb 15.4 oz (65.3 kg) Height:  5\' 4"  (162.6 cm)  BEHAVIORAL SYMPTOMS/MOOD NEUROLOGICAL BOWEL NUTRITION STATUS      Incontinent Diet (see discharge summary)  AMBULATORY STATUS COMMUNICATION OF NEEDS Skin   Extensive Assist Verbally Other (Comment) (ecchymosis)                       Personal Care Assistance Level of Assistance  Bathing, Feeding, Dressing Bathing Assistance: Maximum assistance Feeding assistance: Limited assistance Dressing Assistance: Maximum assistance     Functional Limitations Info  Sight, Hearing, Speech Sight Info: Adequate Hearing Info: Impaired Speech Info: Adequate    SPECIAL CARE FACTORS FREQUENCY  PT (By licensed PT), OT (By licensed OT)     PT Frequency: 5x week OT Frequency: 5x week            Contractures Contractures Info: Not present    Additional Factors Info  Code Status, Allergies Code Status Info: full Allergies Info: Donepezil, Escitalopram Oxalate, Hydrochlorothiazide           Current Medications (08/26/2023):  This is the current hospital active medication list Current Facility-Administered Medications  Medication Dose Route Frequency Provider Last Rate Last Admin   acetaminophen (TYLENOL) tablet 650 mg  650 mg Oral Q6H PRN Kc, Ramesh, MD       Or   acetaminophen (TYLENOL) suppository 650 mg  650 mg Rectal Q6H PRN Kc,  Dayna Barker, MD       apixaban Everlene Balls) tablet 2.5 mg  2.5 mg Oral BID Kc, Dayna Barker, MD   2.5 mg at 08/26/23 0856   [START ON 08/27/2023] Chlorhexidine Gluconate Cloth 2 % PADS 6 each  6 each Topical Q0600 Kc, Dayna Barker, MD       cyanocobalamin (VITAMIN B12) tablet 1,000 mcg  1,000 mcg Oral Daily Kc, Ramesh, MD   1,000 mcg at 08/26/23 0856   feeding supplement (ENSURE ENLIVE / ENSURE PLUS) liquid 237 mL   237 mL Oral BID BM Kc, Ramesh, MD       gabapentin (NEURONTIN) capsule 200 mg  200 mg Oral QHS Kc, Ramesh, MD   200 mg at 08/25/23 2139   ketorolac (TORADOL) 15 MG/ML injection 15 mg  15 mg Intravenous Q6H PRN Lanae Boast, MD   15 mg at 08/25/23 2139   levothyroxine (SYNTHROID) tablet 75 mcg  75 mcg Oral QAC breakfast Kc, Dayna Barker, MD   75 mcg at 08/26/23 0856   lisinopril (ZESTRIL) tablet 5 mg  5 mg Oral Daily Kc, Ramesh, MD   5 mg at 08/26/23 0856   memantine (NAMENDA) tablet 5 mg  5 mg Oral q morning Kc, Ramesh, MD   5 mg at 08/26/23 0856   metoprolol succinate (TOPROL-XL) 24 hr tablet 25 mg  25 mg Oral Daily Kc, Ramesh, MD   25 mg at 08/26/23 0856   OLANZapine (ZYPREXA) tablet 10 mg  10 mg Oral QHS Kc, Dayna Barker, MD   10 mg at 08/25/23 2138   OLANZapine (ZYPREXA) tablet 5 mg  5 mg Oral q morning Kc, Ramesh, MD   5 mg at 08/26/23 0856   ondansetron (ZOFRAN) tablet 4 mg  4 mg Oral Q6H PRN Kc, Dayna Barker, MD       Or   ondansetron (ZOFRAN) injection 4 mg  4 mg Intravenous Q6H PRN Kc, Ramesh, MD       oxyCODONE (Oxy IR/ROXICODONE) immediate release tablet 5 mg  5 mg Oral Q4H PRN Kc, Ramesh, MD       senna-docusate (Senokot-S) tablet 1 tablet  1 tablet Oral QHS PRN Kc, Ramesh, MD       sertraline (ZOLOFT) tablet 50 mg  50 mg Oral Daily Kc, Ramesh, MD   50 mg at 08/26/23 0856   simvastatin (ZOCOR) tablet 20 mg  20 mg Oral QHS Kc, Ramesh, MD   20 mg at 08/25/23 2139   traZODone (DESYREL) tablet 100 mg  100 mg Oral Grier Rocher, MD   100 mg at 08/25/23 2139     Discharge Medications: Please see discharge summary for a list of discharge medications.  Relevant Imaging Results:  Relevant Lab Results:   Additional Information SSN: [id number]  Lorri Frederick, LCSW

## 2023-08-26 NOTE — Progress Notes (Signed)
 RE:  [name]       Date of Birth: 25-Feb-2036      Date:   08/26/23       To Whom It May Concern:  Please be advised that the above-named patient has a primary diagnosis of dementia which supersedes any psychiatric diagnosis.                 MD signature                Date

## 2023-08-27 DIAGNOSIS — S329XXA Fracture of unspecified parts of lumbosacral spine and pelvis, initial encounter for closed fracture: Secondary | ICD-10-CM | POA: Diagnosis not present

## 2023-08-27 NOTE — TOC Progression Note (Signed)
 Transition of Care [facility]) - Initial/Assessment Note    Patient Details  Name: [name] MRN: [id number] Date of Birth: Sep 16, 1935  Transition of Care [facility]) CM/SW Contact:    Ralene Bathe, LCSW Phone Number: 08/27/2023, 2:40 PM  Clinical Narrative:                 CSW contacted patient's granddaughter, [name], and presented bed offers.  Family would like to wait for response from [facility] and [facility] prior to making final decision.  TOC following.  Expected Discharge Plan: Skilled Nursing Facility Barriers to Discharge: Continued Medical Work up, SNF Pending bed offer   Patient Goals and CMS Choice   CMS Medicare.gov Compare Post Acute Care list provided to:: Patient Represenative (must comment) (grandson Sports coach)        Expected Discharge Plan and Services     Post Acute Care Choice: Skilled Nursing Facility Living arrangements for the past 2 months: Single Family Home                                      Prior Living Arrangements/Services Living arrangements for the past 2 months: Single Family Home Lives with:: Relatives (granddaughter [name]) Patient language and need for interpreter reviewed:: Yes        Need for Family Participation in Patient Care: Yes (Comment) Care giver support system in place?: Yes (comment) Current home services: Other (comment) (none) Criminal Activity/Legal Involvement Pertinent to Current Situation/Hospitalization: No - Comment as needed  Activities of Daily Living   ADL Screening (condition at time of admission) Independently performs ADLs?: No Does the patient have a NEW difficulty with bathing/dressing/toileting/self-feeding that is expected to last >3 days?: No Does the patient have a NEW difficulty with getting in/out of bed, walking, or climbing stairs that is expected to last >3 days?: No Does the patient have a NEW difficulty with communication that is expected to last >3 days?: No Is the patient  deaf or have difficulty hearing?: No Does the patient have difficulty seeing, even when wearing glasses/contacts?: No Does the patient have difficulty concentrating, remembering, or making decisions?: Yes  Permission Sought/Granted                  Emotional Assessment Appearance:: Appears stated age Attitude/Demeanor/Rapport: Engaged Affect (typically observed): Pleasant Orientation: : Oriented to Self      Admission diagnosis:  Pelvis fracture, right (HCC) [S32.9XXA] Fall, initial encounter [id number]] Patient Active Problem List   Diagnosis Date Noted   Pelvic fracture (HCC) 08/24/2023   Expressive aphasia 09/05/2022   AKI (acute kidney injury) (HCC) 09/05/2022   Leukocytosis 09/05/2022   History of pulmonary embolism 09/05/2022   CAP (community acquired pneumonia) 08/31/2021   Acute deep vein thrombosis (DVT) of right lower extremity (HCC) 08/31/2021   Sepsis (HCC) 08/28/2021   Hypothyroidism, unspecified 08/28/2021   H/O total hip arthroplasty, left 10/22/2020   Osteoarthritis of left hip 02/06/2020   Anxiety 02/04/2020   CVA (cerebral vascular accident) (HCC) 02/04/2020   Depression 02/04/2020   Essential hypertension 02/04/2020   HSV-2 (herpes simplex virus 2) infection 02/04/2020   Insomnia 02/04/2020   Vitamin D deficiency 02/04/2020   Constipation 01/09/2020   Hearing loss 01/09/2020   Hx of completed stroke 01/09/2020   Hx of herpes simplex infection 01/09/2020   Mixed dyslipidemia 01/09/2020   Normocytic anemia 01/09/2020   Prediabetes 01/09/2020   Primary  osteoarthritis involving multiple joints 01/09/2020   Venous insufficiency 01/09/2020   Vitamin B12 deficiency 01/09/2020   Dementia with behavioral disturbance 01/08/2020   Delusional thoughts (HCC) 01/06/2020   Aggressive behavior    Hallucinations    Acquired hypothyroidism 03/18/2014   Mixed hyperlipidemia 03/18/2014   PCP:  Camie Patience, FNP Pharmacy:   [facility] [phone] Gin[place] Otto, [place] - [address] AT [facility] [address] [facility] [address] [place] [address] Phone: [phone] Fax: [phone]  [facility] - [place], [place] - [address] [place] [address] Phone: [phone] Fax: [phone]     Social Drivers of Health (SDOH) Social History: SDOH Screenings   Food Insecurity: Patient Unable To Answer (08/25/2023)  Housing: Low Risk  (08/25/2023)  Transportation Needs: Patient Unable To Answer (08/25/2023)  Utilities: Patient Unable To Answer (08/25/2023)  Depression (PHQ2-9): Low Risk  (09/29/2022)  Social Connections: Unknown (08/25/2023)  Tobacco Use: Low Risk  (08/24/2023)   SDOH Interventions:     Readmission Risk Interventions    08/31/2021    1:26 PM  Readmission Risk Prevention Plan  Post Dischage Appt Complete  Medication Screening Complete  Transportation Screening Complete

## 2023-08-27 NOTE — Plan of Care (Signed)
  Problem: Clinical Measurements: Goal: Ability to maintain clinical measurements within normal limits will improve Outcome: Progressing Goal: Will remain free from infection Outcome: Progressing Goal: Diagnostic test results will improve Outcome: Progressing Goal: Respiratory complications will improve Outcome: Progressing Goal: Cardiovascular complication will be avoided Outcome: Progressing   Problem: Nutrition: Goal: Adequate nutrition will be maintained Outcome: Progressing   Problem: Coping: Goal: Level of anxiety will decrease Outcome: Progressing   Problem: Pain Managment: Goal: General experience of comfort will improve and/or be controlled Outcome: Progressing   Problem: Skin Integrity: Goal: Risk for impaired skin integrity will decrease Outcome: Progressing

## 2023-08-27 NOTE — Progress Notes (Signed)
 PROGRESS NOTE CLOTEE SCHLICKER  ZOX:[id number] DOB: 07-19-1935 DOA: 08/24/2023 PCP: Camie Patience, FNP  Brief Narrative/Hospital Course: 88 year old female with history of dementia, hypothyroidism, hyperlipidemia, HTN, on Eliquis who had a fall 08/23/2023 and having difficulty getting out of bed and pain in her right hip so brought to the ED for further evaluation and found to have mildly displaced and comminuted fracture of the left pubic body and admitted for for pain management as fracture deemed to be stable and weight bearing as tolerated.  Significant imaging/procedures : chest x-ray/CT head no acute finding, CT head showed designated volume loss chronic small vessel ischemic changes X-ray, pelvis and CT pelvis> Mildly displaced and comminuted fracture of the left pubic body extending to the junction of the superior ramus. Fracture extends to the upper aspect of the pubic symphysis.Left hip arthroplasty without complication.Lucency through the anterior superior endplate of L4 vertebral body with slight superior endplate compression deformity is age indeterminate. Question of soft tissue thickening adjacent to the L4 fracture. Recommend correlation with focal tenderness    Subjective: Seen and examined this morning She says she is sleepy this morning  Overnight afebrile BP stable  She was retaining urine.  Foley catheter placement done on 3/7-Foley draining well   Assessment and Plan: Principal Problem:   Pelvic fracture (HCC) Active Problems:   Essential hypertension   History of pulmonary embolism   Acquired hypothyroidism   Dementia with behavioral disturbance   Depression   Hearing loss   Left Pelvic fracture Fall at home: Patient had a fall at home imaging showed mildly displaced and comminuted fracture of the left pubic body, discussed with Earney Hamburg from orthopedics plan is to pain control PT OT and weightbearing as tolerated as advised by ED  Continue PT OT plan awaiting  for placement  AKI on CKD 3a Urine retention: creat up 1.37 form baseline 1.0 w/ ckd 3a.improved with IV fluids.  Needed Foley catheter placement 3/7 due to urine retention   Essential hypertension: BP is controlled continue home Toprol, lisinopril.   History of pulmonary embolism: Cont home Eliquis   Acquired hypothyroidism: Cont her synthroid   Dementia with behavioral disturbance Depression Hearing loss: Mood stable pleasantly confused with baseline dementia. ont memantine and Zoloft trazodone, zyprexa, vitamin supplement and keep on delirium precaution fall precaution   HLD: Cont statins.  DVT prophylaxis: apixaban (ELIQUIS) tablet 2.5 mg Start: 08/24/23 2200 Code Status:   Code Status: Full Code Family Communication: plan of care discussed with patient/none at bedside. Patient status is: Remains hospitalized because of severity of illness and for need for IVF for AKI Level of care: Telemetry Medical   Dispo: The patient is from: Home w/ granddaughter.            Anticipated disposition: Awaiting on skilled nursing facility.  Discussed with TOC  Objective: Vitals last 24 hrs: Vitals:   08/26/23 0852 08/26/23 2006 08/27/23 0320 08/27/23 0853  BP: (!) 149/82 (!) 133/59 (!) 154/55 (!) 123/59  Pulse: 84 70 86 92  Resp: 14 18 16 18   Temp: 98 F (36.7 C) 97.8 F (36.6 C) 97.8 F (36.6 C) 98.7 F (37.1 C)  TempSrc: Oral Oral  Oral  SpO2: 95% 96% 100% 95%  Weight:      Height:       Weight change:   Physical Examination: General exam: alert awake, oriented x 1 HEENT:Oral mucosa moist, Ear/Nose WNL grossly Respiratory system: Bilaterally clear BS,no use of accessory muscle Cardiovascular system: S1 &  S2 +, No JVD. Gastrointestinal system: Abdomen soft,NT,ND, BS+ Nervous System: Alert, awake, oriented x 1 moving all extremities,and following commands. Extremities: LE edema neg,distal peripheral pulses palpable and warm.  Skin: No rashes,no icterus. MSK: Normal  muscle bulk,tone, power  Foley catheter in place  Medications reviewed:  Scheduled Meds:  apixaban  2.5 mg Oral BID   Chlorhexidine Gluconate Cloth  6 each Topical Daily   cyanocobalamin  1,000 mcg Oral Daily   feeding supplement  237 mL Oral BID BM   gabapentin  200 mg Oral QHS   levothyroxine  75 mcg Oral QAC breakfast   lisinopril  5 mg Oral Daily   memantine  5 mg Oral q morning   metoprolol succinate  25 mg Oral Daily   OLANZapine  10 mg Oral QHS   OLANZapine  5 mg Oral q morning   sertraline  50 mg Oral Daily   simvastatin  20 mg Oral QHS   traZODone  100 mg Oral QHS  Continuous Infusions:    Diet Order             Diet regular Room service appropriate? Yes; Fluid consistency: Thin  Diet effective now                   Intake/Output Summary (Last 24 hours) at 08/27/2023 1045 Last data filed at 08/27/2023 0600 Gross per 24 hour  Intake 120 ml  Output 2625 ml  Net -2505 ml   Net IO Since Admission: -3,165.13 mL [08/27/23 1045]  Wt Readings from Last 3 Encounters:  08/25/23 65.3 kg  11/02/22 71.2 kg  09/29/22 72.1 kg     Unresulted Labs (From admission, onward)    None     Data Reviewed: I have personally reviewed following labs and imaging studies CBC: Recent Labs  Lab 08/24/23 1311 08/25/23 0614  WBC 7.8 7.3  NEUTROABS 6.1  --   HGB 11.8* 10.8*  HCT 37.6 34.6*  MCV 88.9 88.5  PLT 177 156   Basic Metabolic Panel:  Recent Labs  Lab 08/24/23 1311 08/25/23 0614 08/26/23 0826  NA 140 138 136  K 4.1 4.8 4.5  CL 104 105 106  CO2 28 25 24   GLUCOSE 115* 118* 97  BUN 21 33* 33*  CREATININE 1.00 1.37* 1.03*  CALCIUM 8.8* 8.3* 7.6*   GFR: Estimated Creatinine Clearance: 33.9 mL/min (A) (by C-G formula based on SCr of 1.03 mg/dL (H)). Liver Function Tests:  Recent Labs  Lab 08/24/23 1311  INR 1.2  Sepsis Labs: No results for input(s): "PROCALCITON", "LATICACIDVEN" in the last 168 hours. No results found for this or any previous visit (from  the past 240 hours).  Antimicrobials/Microbiology: Anti-infectives (From admission, onward)    None         Component Value Date/Time   SDES BLOOD LEFT ARM 08/27/2021 1720   SPECREQUEST  08/27/2021 1720    BOTTLES DRAWN AEROBIC AND ANAEROBIC Blood Culture results may not be optimal due to an inadequate volume of blood received in culture bottles   CULT  08/27/2021 1720    NO GROWTH 5 DAYS Performed at [facility], [address] [place], [place] [address]    REPTSTATUS 09/01/2021 FINAL 08/27/2021 1720     Radiology Studies: No results found.    LOS: 0 days   Total time spent in review of labs and imaging, patient evaluation, formulation of plan, documentation and communication with family: 35 minutes  Lanae Boast, MD  [facility]  08/27/2023, 10:45 AM

## 2023-08-28 DIAGNOSIS — S329XXA Fracture of unspecified parts of lumbosacral spine and pelvis, initial encounter for closed fracture: Secondary | ICD-10-CM | POA: Diagnosis not present

## 2023-08-28 LAB — BASIC METABOLIC PANEL
Anion gap: 12 (ref 5–15)
BUN: 27 mg/dL — ABNORMAL HIGH (ref 8–23)
CO2: 27 mmol/L (ref 22–32)
Calcium: 9.1 mg/dL (ref 8.9–10.3)
Chloride: 102 mmol/L (ref 98–111)
Creatinine, Ser: 0.88 mg/dL (ref 0.44–1.00)
GFR, Estimated: >60 mL/min (ref >60)
Glucose, Bld: 138 mg/dL — ABNORMAL HIGH (ref 70–99)
Potassium: 4.5 mmol/L (ref 3.5–5.1)
Sodium: 141 mmol/L (ref 135–145)

## 2023-08-28 LAB — CBC
HCT: 35.2 % — ABNORMAL LOW (ref 36.0–46.0)
Hemoglobin: 11 g/dL — ABNORMAL LOW (ref 12.0–15.0)
MCH: 27.7 pg (ref 26.0–34.0)
MCHC: 31.3 g/dL (ref 30.0–36.0)
MCV: 88.7 fL (ref 80.0–100.0)
Platelets: 220 10*3/uL (ref 150–400)
RBC: 3.97 MIL/uL (ref 3.87–5.11)
RDW: 14 % (ref 11.5–15.5)
WBC: 6.3 10*3/uL (ref 4.0–10.5)
nRBC: 0 % (ref 0.0–0.2)

## 2023-08-28 NOTE — Progress Notes (Signed)
 I offered patient her tray at breakfast and lunch and she refused stating that she was not hungry. She drank an Ensure at lunchtime.

## 2023-08-28 NOTE — Plan of Care (Signed)
  Problem: Clinical Measurements: Goal: Ability to maintain clinical measurements within normal limits will improve Outcome: Progressing Goal: Will remain free from infection Outcome: Progressing Goal: Diagnostic test results will improve Outcome: Progressing Goal: Respiratory complications will improve Outcome: Progressing Goal: Cardiovascular complication will be avoided Outcome: Progressing   Problem: Activity: Goal: Risk for activity intolerance will decrease Outcome: Progressing   Problem: Nutrition: Goal: Adequate nutrition will be maintained Outcome: Progressing   Problem: Coping: Goal: Level of anxiety will decrease Outcome: Progressing   Problem: Pain Managment: Goal: General experience of comfort will improve and/or be controlled Outcome: Progressing   Problem: Safety: Goal: Ability to remain free from injury will improve Outcome: Progressing

## 2023-08-28 NOTE — Progress Notes (Addendum)
 PROGRESS NOTE [name]  ZOX:[id number] DOB: Jan 13, 1936 DOA: 08/24/2023 PCP: Camie Patience, FNP  Brief Narrative/Hospital Course: 88 year old female with history of dementia, hypothyroidism, hyperlipidemia, HTN, on Eliquis who had a fall 08/23/2023 and having difficulty getting out of bed and pain in her right hip so brought to the ED for further evaluation and found to have mildly displaced and comminuted fracture of the left pubic body and admitted for for pain management as fracture deemed to be stable and weight bearing as tolerated.  In ED- CXR/CT head no acute finding, CT head showed designated volume loss chronic small vessel ischemic changes X-ray, pelvis and CT pelvis> Mildly displaced and comminuted fracture of the left pubic body extending to the junction of the superior ramus. Fracture extends to the upper aspect of the pubic symphysis.Left hip arthroplasty without complication.Lucency through the anterior superior endplate of L4 vertebral body with slight superior endplate compression deformity is age indeterminate. Question of soft tissue thickening adjacent to the L4 fracture. Recommend correlation with focal tenderness -Patient had acute renal failure with slight bump in creatinine given IV fluids with improvement.  She had urine retention PRN multiple-oh and subsequently Foley catheter placed. Seen by PT OT, recommending skilled nursing facility and awaiting placement.   Subjective: Resting comfortably eyes closed able to open and able to tell me her name. Overnight BP stable in 140s, on room air, afebrile  Foley catheter remains in place pending improvement in mobility.   Assessment and Plan: Principal Problem:   Pelvic fracture (HCC) Active Problems:   Essential hypertension   History of pulmonary embolism   Acquired hypothyroidism   Dementia with behavioral disturbance   Depression   Hearing loss   Left Pelvic fracture Fall at home: Patient had a fall at home imaging  showed mildly displaced and comminuted fracture of the left pubic body, discussed with Earney Hamburg from orthopedics plan is to pain control PT OT and weightbearing as tolerated as advised by ED.  Continue PT OT plan awaiting for placement  AKI on CKD 3a Urine retention: creat up 1.37 form baseline 1.0 w/ ckd 3a.improved with IV fluids.  Needed Foley catheter placement 3/7 due to urine retention  Will get labs.  Essential hypertension: BP is controlled continue home Toprol, lisinopril.   History of pulmonary embolism: Cont home Eliquis   Acquired hypothyroidism: Cont her synthroid   Dementia with behavioral disturbance Depression Hearing loss: Mood stable pleasantly confused with baseline dementia. ont memantine and Zoloft trazodone, zyprexa, vitamin supplement and keep on delirium precaution fall precaution   HLD: Cont statins.  DVT prophylaxis: apixaban (ELIQUIS) tablet 2.5 mg Start: 08/24/23 2200 Code Status:   Code Status: Full Code Family Communication: plan of care discussed with patient/none at bedside.  Updated patient's gran she is bit upset thatddaughter-patient food is laying by her side, and not being fed. I asked nursing staff to feed the patient with assistance  Obtaining routine labs to monitor her BUN/creatinine today.  Patient status is: Remains hospitalized because of severity of illness Level of care: Telemetry Medical   Dispo: The patient is from: Home w/ granddaughter.            Anticipated disposition: Awaiting on skilled nursing facility.  Discussed with TOC  Objective: Vitals last 24 hrs: Vitals:   08/27/23 1209 08/27/23 1934 08/28/23 0420 08/28/23 0859  BP: (!) 109/50 (!) 146/67 (!) 144/67 (!) 145/72  Pulse: 74 77 78 83  Resp: 17 18 18 16   Temp: 99  F (37.2 C) 99 F (37.2 C) 98.5 F (36.9 C) 98.1 F (36.7 C)  TempSrc: Oral Oral Axillary   SpO2: 94% 96% 95% 95%  Weight:      Height:       Weight change:   Physical  Examination: General exam: alert awake, oriented x 1 HEENT:Oral mucosa moist, Ear/Nose WNL grossly Respiratory system: Bilaterally clear BS,no use of accessory muscle Cardiovascular system: S1 & S2 +, No JVD. Gastrointestinal system: Abdomen soft,NT,ND, BS+ Nervous System: Alert, awake, moving all extremities,and following commands. Extremities: LE edema neg,distal peripheral pulses palpable and warm.  Skin: No rashes,no icterus. MSK: Normal muscle bulk,tone, power   Medications reviewed:  Scheduled Meds:  apixaban  2.5 mg Oral BID   Chlorhexidine Gluconate Cloth  6 each Topical Daily   cyanocobalamin  1,000 mcg Oral Daily   feeding supplement  237 mL Oral BID BM   gabapentin  200 mg Oral QHS   levothyroxine  75 mcg Oral QAC breakfast   lisinopril  5 mg Oral Daily   memantine  5 mg Oral q morning   metoprolol succinate  25 mg Oral Daily   OLANZapine  10 mg Oral QHS   OLANZapine  5 mg Oral q morning   sertraline  50 mg Oral Daily   simvastatin  20 mg Oral QHS   traZODone  100 mg Oral QHS  Continuous Infusions:    Diet Order             Diet regular Room service appropriate? Yes; Fluid consistency: Thin  Diet effective now                   Intake/Output Summary (Last 24 hours) at 08/28/2023 1155 Last data filed at 08/28/2023 1055 Gross per 24 hour  Intake 240 ml  Output 1600 ml  Net -1360 ml   Net IO Since Admission: -4,285.13 mL [08/28/23 1155]  Wt Readings from Last 3 Encounters:  08/25/23 65.3 kg  11/02/22 71.2 kg  09/29/22 72.1 kg     Unresulted Labs (From admission, onward)    None     Data Reviewed: I have personally reviewed following labs and imaging studies CBC: Recent Labs  Lab 08/24/23 1311 08/25/23 0614  WBC 7.8 7.3  NEUTROABS 6.1  --   HGB 11.8* 10.8*  HCT 37.6 34.6*  MCV 88.9 88.5  PLT 177 156   Basic Metabolic Panel:  Recent Labs  Lab 08/24/23 1311 08/25/23 0614 08/26/23 0826  NA 140 138 136  K 4.1 4.8 4.5  CL 104 105 106   CO2 28 25 24   GLUCOSE 115* 118* 97  BUN 21 33* 33*  CREATININE 1.00 1.37* 1.03*  CALCIUM 8.8* 8.3* 7.6*   GFR: Estimated Creatinine Clearance: 33.9 mL/min (A) (by C-G formula based on SCr of 1.03 mg/dL (H)). Liver Function Tests:  Recent Labs  Lab 08/24/23 1311  INR 1.2  Sepsis Labs: No results for input(s): "PROCALCITON", "LATICACIDVEN" in the last 168 hours. No results found for this or any previous visit (from the past 240 hours).  Antimicrobials/Microbiology: Anti-infectives (From admission, onward)    None         Component Value Date/Time   SDES BLOOD LEFT ARM 08/27/2021 1720   SPECREQUEST  08/27/2021 1720    BOTTLES DRAWN AEROBIC AND ANAEROBIC Blood Culture results may not be optimal due to an inadequate volume of blood received in culture bottles   CULT  08/27/2021 1720    NO GROWTH 5  DAYS Performed at [facility], [address] [place], [place] [address]    REPTSTATUS 09/01/2021 FINAL 08/27/2021 1720     Radiology Studies: No results found.    LOS: 0 days   Total time spent in review of labs and imaging, patient evaluation, formulation of plan, documentation and communication with family:  25 minutes  Lanae Boast, MD  [facility]  08/28/2023, 11:55 AM

## 2023-08-29 DIAGNOSIS — S329XXA Fracture of unspecified parts of lumbosacral spine and pelvis, initial encounter for closed fracture: Secondary | ICD-10-CM | POA: Diagnosis not present

## 2023-08-29 MED ORDER — SODIUM CHLORIDE 0.9 % IV BOLUS: 500.0000 mL | Freq: Once | INTRAVENOUS | Status: DC

## 2023-08-29 MED ORDER — TAMSULOSIN HCL 0.4 MG PO CAPS
0.4000 mg | ORAL_CAPSULE | Freq: Every day | ORAL | Status: DC
  Administered 2023-08-29 – 2023-08-31 (×3): 0.4 mg via ORAL
  Filled 2023-08-29 (×3): qty 1

## 2023-08-29 NOTE — Plan of Care (Addendum)
@  1320 Attempted to transfer patient to [facility], patient unable to tolerate weight on legs with 2 person assist. Patient states her legs are extremely weak from being in bed.  Problem: Education: Goal: Knowledge of General Education information will improve Description: Including pain rating scale, medication(s)/side effects and non-pharmacologic comfort measures Outcome: Progressing   Problem: Activity: Goal: Risk for activity intolerance will decrease Outcome: Progressing   Problem: Pain Managment: Goal: General experience of comfort will improve and/or be controlled Outcome: Progressing   Problem: Safety: Goal: Ability to remain free from injury will improve Outcome: Progressing   Problem: Skin Integrity: Goal: Risk for impaired skin integrity will decrease Outcome: Progressing

## 2023-08-29 NOTE — Progress Notes (Signed)
 PROGRESS NOTE [name]  ZOX:[id number] DOB: [birth year] DOA: 08/24/2023 PCP: Camie Patience, FNP  Brief Narrative/Hospital Course: 88 year old female with history of dementia, hypothyroidism, hyperlipidemia, HTN, on Eliquis who had a fall 08/23/2023 and having difficulty getting out of bed and pain in her right hip so brought to the ED for further evaluation and found to have mildly displaced and comminuted fracture of the left pubic body and admitted for for pain management as fracture deemed to be stable and weight bearing as tolerated.  In ED- CXR/CT head no acute finding, CT head showed designated volume loss chronic small vessel ischemic changes X-ray, pelvis and CT pelvis> Mildly displaced and comminuted fracture of the left pubic body extending to the junction of the superior ramus. Fracture extends to the upper aspect of the pubic symphysis.Left hip arthroplasty without complication.Lucency through the anterior superior endplate of L4 vertebral body with slight superior endplate compression deformity is age indeterminate. Question of soft tissue thickening adjacent to the L4 fracture. Recommend correlation with focal tenderness -Patient had acute renal failure with slight bump in creatinine given IV fluids with improvement.  She had urine retention PRN multiple-oh and subsequently Foley catheter placed. Seen by PT OT, recommending skilled nursing facility and awaiting placement.   Subjective: Seen this am More alert awake Grandaughter wants to take her home Foley removed but needed in and out cath and doing voiding trial About to start eating with granddaughter.    Assessment and Plan: Principal Problem:   Pelvic fracture (HCC) Active Problems:   Essential hypertension   History of pulmonary embolism   Acquired hypothyroidism   Dementia with behavioral disturbance   Depression   Hearing loss   Left Pelvic fracture Fall at home: Patient had a fall at home imaging showed mildly  displaced and comminuted fracture of the left pubic body, discussed with Earney Hamburg from orthopedics plan is to pain control PT OT and weightbearing as tolerated as advised by ED.  Patient was previously followed by [facility] will need to have outpatient follow-up.  Continue aggressive PT OT, nursing tried to get her up bedside bedside-even with max to assist difficulty with getting up due to weakness. Granddaughter wants to take her home with family support I suspect she will be high risk for readmission.  Likely will benefit with skilled nursing facility at this time Continue PT OT eval today again  AKI on CKD 3a Urine retention: creat up 1.37 from baseline 1.0 w/ ckd 3a.improved with IV fluids. Foley was removed last night as granddaughter wanted take her home today and doing voiding trial.  Started on Flomax. If unable to void or postvoid more than 300 cc will put Foley catheter at least for 1 week before doing another voiding trial  Essential hypertension: BP is controlled continue home Toprol, lisinopril.   History of pulmonary embolism: Cont home Eliquis   Acquired hypothyroidism: Cont her synthroid   Dementia with behavioral disturbance Depression Hearing loss: Mood stable pleasantly confused with baseline dementia. Please feed with assistance for dementia patient Continue her home Memantine and Zoloft trazodone, zyprexa, vitamin supplement Keep on delirium precaution fall precaution   HLD: Cont statins.  DVT prophylaxis: apixaban (ELIQUIS) tablet 2.5 mg Start: 08/24/23 2200 Code Status:   Code Status: Full Code Family Communication: plan of care discussed with patient/none at bedside.  Granddaughter updated extensively.    Patient status is: Remains hospitalized because of severity of illness Level of care: Telemetry Medical   Dispo: The patient  is from: Home w/ granddaughter.            Anticipated disposition: Awaiting on skilled nursing facility.   Discussed with [place]. Granddaughter  Objective: Vitals last 24 hrs: Vitals:   08/28/23 0859 08/28/23 2030 08/29/23 0422 08/29/23 0734  BP: (!) 145/72 111/62 (!) 126/58 123/60  Pulse: 83 91 75 73  Resp: 16 17 16 17   Temp: 98.1 F (36.7 C) 99.2 F (37.3 C) 98.9 F (37.2 C) 98 F (36.7 C)  TempSrc:  Oral Axillary   SpO2: 95% 93% 94% 92%  Weight:      Height:       Weight change:   Physical Examination: General exam: alert awake, weak frail, pleasantly confused oriented to self and family members HEENT:Oral mucosa moist, Ear/Nose WNL grossly Respiratory system: Bilaterally clear BS,no use of accessory muscle Cardiovascular system: S1 & S2 +, No JVD. Gastrointestinal system: Abdomen soft,NT,ND, BS+ Nervous System: Alert, awake, moving  UE well, and wiggles toes Extremities: LE edema neg,distal peripheral pulses palpable and warm.  Skin: No rashes,no icterus. MSK: Normal muscle bulk,tone, power   Medications reviewed:  Scheduled Meds:  apixaban  2.5 mg Oral BID   Chlorhexidine Gluconate Cloth  6 each Topical Daily   cyanocobalamin  1,000 mcg Oral Daily   feeding supplement  237 mL Oral BID BM   gabapentin  200 mg Oral QHS   levothyroxine  75 mcg Oral QAC breakfast   lisinopril  5 mg Oral Daily   memantine  5 mg Oral q morning   metoprolol succinate  25 mg Oral Daily   OLANZapine  10 mg Oral QHS   OLANZapine  5 mg Oral q morning   sertraline  50 mg Oral Daily   simvastatin  20 mg Oral QHS   tamsulosin  0.4 mg Oral Daily   traZODone  100 mg Oral QHS  Continuous Infusions:    Diet Order             Diet regular Room service appropriate? Yes; Fluid consistency: Thin  Diet effective now                   Intake/Output Summary (Last 24 hours) at 08/29/2023 1427 Last data filed at 08/29/2023 0630 Gross per 24 hour  Intake --  Output 1250 ml  Net -1250 ml   Net IO Since Admission: -5,295.13 mL [08/29/23 1427]  Wt Readings from Last 3 Encounters:  08/25/23 65.3 kg   11/02/22 71.2 kg  09/29/22 72.1 kg     Unresulted Labs (From admission, onward)    None     Data Reviewed: I have personally reviewed following labs and imaging studies CBC: Recent Labs  Lab 08/24/23 1311 08/25/23 0614 08/28/23 1504  WBC 7.8 7.3 6.3  NEUTROABS 6.1  --   --   HGB 11.8* 10.8* 11.0*  HCT 37.6 34.6* 35.2*  MCV 88.9 88.5 88.7  PLT 177 156 220   Basic Metabolic Panel:  Recent Labs  Lab 08/24/23 1311 08/25/23 0614 08/26/23 0826 08/28/23 1504  NA 140 138 136 141  K 4.1 4.8 4.5 4.5  CL 104 105 106 102  CO2 28 25 24 27   GLUCOSE 115* 118* 97 138*  BUN 21 33* 33* 27*  CREATININE 1.00 1.37* 1.03* 0.88  CALCIUM 8.8* 8.3* 7.6* 9.1   GFR: Estimated Creatinine Clearance: 39.6 mL/min (by C-G formula based on SCr of 0.88 mg/dL). Liver Function Tests:  Recent Labs  Lab 08/24/23 1311  INR  1.2  Sepsis Labs: No results for input(s): "PROCALCITON", "LATICACIDVEN" in the last 168 hours. No results found for this or any previous visit (from the past 240 hours).  Antimicrobials/Microbiology: Anti-infectives (From admission, onward)    None         Component Value Date/Time   SDES BLOOD LEFT ARM 08/27/2021 1720   SPECREQUEST  08/27/2021 1720    BOTTLES DRAWN AEROBIC AND ANAEROBIC Blood Culture results may not be optimal due to an inadequate volume of blood received in culture bottles   CULT  08/27/2021 1720    NO GROWTH 5 DAYS Performed at [facility], [address] [place], [place] [address]    REPTSTATUS 09/01/2021 FINAL 08/27/2021 1720     Radiology Studies: No results found.    LOS: 0 days   Total time spent in review of labs and imaging, patient evaluation, formulation of plan, documentation and communication with family:  25 minutes  Lanae Boast, MD  [facility]  08/29/2023, 2:27 PM

## 2023-08-29 NOTE — Progress Notes (Signed)
 Physical Therapy Treatment Patient Details Name: [name] MRN: [id number] DOB: 1936-05-17 Today's Date: 08/29/2023   History of Present Illness Patient is a 88 year old with fall and hip pain.  Found a mildly displaced and comminuted fracture left pubic body extending to junction of the superior ramus. History of dementia, hypothyroidism, hyperlipidemia, HTN    PT Comments  Pt received in supine, sleeping but easily awoken and pt agreeable to therapy session, with good participation as able. Pt needing up to +2 modA for sit<>stand and unable to perform weight shifting/sidesteps or forward steps despite RW support and +2 maxA for weight shifting attempts. Instead, pt performed sit<>stand from EOB<>Stedy sit to stand lift and totalA to transfer to chair via Stedy. Granddaughter present and aware of pt still needing +2 physical assist to stand and totalA to transfer OOB to chair at this time. Pt would need hoyer lift at this time for transfer OOB to chair, DME to be determined next session pending progress and disposition plan. Patient will benefit from continued inpatient follow up therapy, <3 hours/day    If plan is discharge home, recommend the following: A lot of help with walking and/or transfers;A lot of help with bathing/dressing/bathroom;Assistance with cooking/housework;Assist for transportation;Help with stairs or ramp for entrance;Supervision due to cognitive status   Can travel by private vehicle     No  Equipment Recommendations  Wheelchair (measurements PT);Wheelchair cushion (measurements PT)    Recommendations for Other Services       Precautions / Restrictions Precautions Precautions: Fall Recall of Precautions/Restrictions: Impaired Restrictions Weight Bearing Restrictions Per Provider Order: No     Mobility  Bed Mobility Overal bed mobility: Needs Assistance Bed Mobility: Supine to Sit     Supine to sit: Max assist, HOB elevated, Used rails     General bed  mobility comments: assistance for LE and trunk support to sit upright, multimodal cues, increased time to perform. Poor BLE initiation but pt using upper body well to pull up on rail/push away from mattress.    Transfers Overall transfer level: Needs assistance Equipment used: Rolling walker (2 wheels) Transfers: Sit to/from Stand Sit to Stand: Mod assist, +2 physical assistance, From elevated surface, Via lift equipment           General transfer comment: from elevated bed<>RW with modA +2, then from elevated bed<>Stedy with +2 modA and from [facility] flaps. Transfer via Lift Equipment: Stedy  Ambulation/Gait Ambulation/Gait assistance: Max assist, +2 physical assistance   Assistive device: Rolling walker (2 wheels) Gait Pattern/deviations: Shuffle     Pre-gait activities: x1 side step toward her L with +2 maxA  for weight shifting and sliding her L leg, but pt unable to perform additional steps after this due to c/o pain and fatigue, needing to sit back down.     Stairs             Wheelchair Mobility     Tilt Bed    Modified Rankin (Stroke Patients Only)       Balance Overall balance assessment: Needs assistance Sitting-balance support: Feet supported, Bilateral upper extremity supported, Single extremity supported Sitting balance-Leahy Scale: Fair     Standing balance support: Bilateral upper extremity supported, Reliant on assistive device for balance Standing balance-Leahy Scale: Poor Standing balance comment: external support required (+2) to maintain standing balance in addition to AD  Communication Communication Communication: No apparent difficulties Factors Affecting Communication: Reduced clarity of speech (pt mumbling)  Cognition Arousal: Alert Behavior During Therapy: WFL for tasks assessed/performed   PT - Cognitive impairments: Orientation, Memory, Attention, Initiation, Sequencing, Problem solving,  Safety/Judgement, Awareness   Orientation impairments: Place, Time, Situation                   PT - Cognition Comments: Patient is oriented to self only. She is confused but cooperative with history of dementia but per granddaughter, she is not at her cognitive baseline this date. Discussed she can keep blinds open during day and TV off to reduce risk of delirium. Following commands: Impaired Following commands impaired: Follows one step commands with increased time    Cueing Cueing Techniques: Verbal cues, Tactile cues, Gestural cues  Exercises Other Exercises Other Exercises: seated BLE AAROM: LAQ x10 reps ea Other Exercises: reclined chair BLE AAROM: hip ab/adduction, ankle pumps and circles, SLR (on R only) x10 reps ea    General Comments General comments (skin integrity, edema, etc.): Pt with sacral foam, no visible drainage or soiling on bed pad, granddaughter present in room to assist her and chair alarm on for safety, PTA got more ice water for her to drink as well.      Pertinent Vitals/Pain Pain Assessment Pain Assessment: Faces Faces Pain Scale: Hurts little more Pain Location: BLE/hips (L side more painful) with standing; minimal pain reported at rest. Pain Descriptors / Indicators: Discomfort, Grimacing, Sore Pain Intervention(s): Limited activity within patient's tolerance, Monitored during session, Premedicated before session, Repositioned    Home Living                          Prior Function            PT Goals (current goals can now be found in the care plan section) Acute Rehab PT Goals Patient Stated Goal: granddaughter would like rehab versus home with PT PT Goal Formulation: With family Time For Goal Achievement: 09/08/23 Progress towards PT goals: Progressing toward goals    Frequency    Min 2X/week      PT Plan      Co-evaluation              AM-PAC PT "6 Clicks" Mobility   Outcome Measure  Help needed turning  from your back to your side while in a flat bed without using bedrails?: A Lot Help needed moving from lying on your back to sitting on the side of a flat bed without using bedrails?: A Lot Help needed moving to and from a bed to a chair (including a wheelchair)?: Total Help needed standing up from a chair using your arms (e.g., wheelchair or bedside chair)?: A Lot Help needed to walk in hospital room?: Total Help needed climbing 3-5 steps with a railing? : Total 6 Click Score: 9    End of Session Equipment Utilized During Treatment: Gait belt Activity Tolerance: Patient tolerated treatment well;Patient limited by pain Patient left: in chair;with call bell/phone within reach;with chair alarm set;with family/visitor present Nurse Communication: Mobility status;Need for lift equipment;Other (comment) (Stedy and +2 for OOB transfers at this time) PT Visit Diagnosis: Other abnormalities of gait and mobility (R26.89);Difficulty in walking, not elsewhere classified (R26.2)     Time: 1610-9604 PT Time Calculation (min) (ACUTE ONLY): 34 min  Charges:    $Therapeutic Exercise: 8-22 mins $Therapeutic Activity: 8-22 mins PT General Charges $$ ACUTE PT  VISIT: 1 Visit                     Durwin Davisson P., PTA Acute Rehabilitation Services Secure Chat Preferred 9a-5:30pm Office: [phone]    Dorathy Kinsman [facility] 08/29/2023, 3:46 PM

## 2023-08-29 NOTE — TOC Progression Note (Addendum)
 Transition of Care [facility]) - Progression Note    Patient Details  Name: [name] MRN: [id number] Date of Birth: 04-Jun-1936  Transition of Care [facility]) CM/SW Contact  Lorri Frederick, LCSW Phone Number: 08/29/2023, 4:02 PM  Clinical Narrative:   CSW LM with [facility]. [facility] does offer bed.  1400: TC [facility].  They do not have any beds available.  1500: CSW spoke with granddaughter [name] in room, discussed bed offers, she will accept offer at [facility].    1610: SNF auth request submitted in navi and approved: [id number], 3 days: 3/11-3/13.  Expected Discharge Plan: Skilled Nursing Facility Barriers to Discharge: Continued Medical Work up, SNF Pending bed offer  Expected Discharge Plan and Services     Post Acute Care Choice: Skilled Nursing Facility Living arrangements for the past 2 months: Single Family Home                                       Social Determinants of Health (SDOH) Interventions SDOH Screenings   Food Insecurity: Patient Unable To Answer (08/25/2023)  Housing: Low Risk  (08/25/2023)  Transportation Needs: Patient Unable To Answer (08/25/2023)  Utilities: Patient Unable To Answer (08/25/2023)  Depression (PHQ2-9): Low Risk  (09/29/2022)  Social Connections: Unknown (08/25/2023)  Tobacco Use: Low Risk  (08/24/2023)    Readmission Risk Interventions    08/31/2021    1:26 PM  Readmission Risk Prevention Plan  Post Dischage Appt Complete  Medication Screening Complete  Transportation Screening Complete

## 2023-08-30 DIAGNOSIS — S329XXA Fracture of unspecified parts of lumbosacral spine and pelvis, initial encounter for closed fracture: Secondary | ICD-10-CM | POA: Diagnosis not present

## 2023-08-30 LAB — BASIC METABOLIC PANEL
Anion gap: 8 (ref 5–15)
BUN: 46 mg/dL — ABNORMAL HIGH (ref 8–23)
CO2: 26 mmol/L (ref 22–32)
Calcium: 8.2 mg/dL — ABNORMAL LOW (ref 8.9–10.3)
Chloride: 102 mmol/L (ref 98–111)
Creatinine, Ser: 1.34 mg/dL — ABNORMAL HIGH (ref 0.44–1.00)
GFR, Estimated: 39 mL/min — ABNORMAL LOW (ref >60)
Glucose, Bld: 136 mg/dL — ABNORMAL HIGH (ref 70–99)
Potassium: 4.1 mmol/L (ref 3.5–5.1)
Sodium: 136 mmol/L (ref 135–145)

## 2023-08-30 MED ORDER — SODIUM CHLORIDE 0.9 % IV SOLN: INTRAVENOUS | Status: DC

## 2023-08-30 NOTE — Progress Notes (Signed)
 PROGRESS NOTE [name]  GNF:[id number] DOB: 09-Aug-1935 DOA: 08/24/2023 PCP: Camie Patience, FNP  Brief Narrative/Hospital Course: 88 year old female with history of dementia, hypothyroidism, hyperlipidemia, HTN, on Eliquis who had a fall 08/23/2023 and having difficulty getting out of bed and pain in her right hip so brought to the ED for further evaluation and found to have mildly displaced and comminuted fracture of the left pubic body and admitted for for pain management as fracture deemed to be stable and weight bearing as tolerated.  In ED- CXR/CT head no acute finding, CT head showed designated volume loss chronic small vessel ischemic changes X-ray, pelvis and CT pelvis> Mildly displaced and comminuted fracture of the left pubic body extending to the junction of the superior ramus. Fracture extends to the upper aspect of the pubic symphysis.Left hip arthroplasty without complication.Lucency through the anterior superior endplate of L4 vertebral body with slight superior endplate compression deformity is age indeterminate. Question of soft tissue thickening adjacent to the L4 fracture. Recommend correlation with focal tenderness -Patient had acute renal failure with slight bump in creatinine given IV fluids with improvement.  She had urine retention PRN multiple-oh and subsequently Foley catheter placed. Seen by PT OT, recommending skilled nursing facility and awaiting placement.   Subjective: Seen and examined this morning  Patient was sleepy able to wake up and tell me her name  Nursing reports the staff tried to take her to the bathroom twice -she did not void and had retention again more than 400 cc  Subsequently Foley placed   Assessment and Plan: Principal Problem:   Pelvic fracture [facility]) Active Problems:   Essential hypertension   History of pulmonary embolism   Acquired hypothyroidism   Dementia with behavioral disturbance   Depression   Hearing loss   Left Pelvic  fracture Fall at home: Patient had a fall at home imaging showed mildly displaced and comminuted fracture of the left pubic body, discussed with Earney Hamburg from orthopedics plan is to pain control PT OT and weightbearing as tolerated as advised by ED.  Patient was previously followed by [facility] will need to have outpatient follow-up.  Continue PT OT, plan prescription facility-has bed offers anticipating discharge 3/12. Initially granddaughter was planning to take her home but she has been needing significant assistance  AKI on CKD 3a Urine retention-multiple episodes: creat up 1.37 from baseline 1.0 w/ ckd 3a and had improved with IV fluids. Again having urine retention issues, nursing reports they tried to help her void but has not been able, subsequently Foley catheter placed  3/11 also starting fluids continue 24 hours recheck BMP in the morning if improves plan for discharge to SNF.  Continue Flomax will keep Foley catheter x 1 week-and will need voiding trial in 1 week.  Essential hypertension: BP is soft we will continue Toprol discontinue lisinopril   History of pulmonary embolism: Cont home Eliquis   Acquired hypothyroidism: Cont her synthroid   Dementia with behavioral disturbance Depression Hearing loss: Mood stable pleasantly confused with baseline dementia. Please feed with assistance for dementia patient Continue her home Memantine and Zoloft trazodone, zyprexa, vitamin supplement Keep on delirium precaution fall precaution   HLD: Cont statins.  DVT prophylaxis: apixaban (ELIQUIS) tablet 2.5 mg Start: 08/24/23 2200 Code Status:   Code Status: Full Code Family Communication: plan of care discussed with patient/none at bedside.  Granddaughter updated extensively.  Called granddaughter and updated  3/11-shared my concern that with urine retention she could end up with  UTI and AKI.  She is not very happy patient has a Foley catheter at this time. Again  discussed nursing staff to try maximum effort for mobility and feeding assistance  Patient status is: Remains hospitalized because of severity of illness Level of care: Telemetry Medical   Dispo: The patient is from: Home w/ granddaughter.            Anticipated disposition: Awaiting on skilled nursing facility likely tomorrow if renal function stable  Objective: Vitals last 24 hrs: Vitals:   08/29/23 1818 08/29/23 1952 08/30/23 0437 08/30/23 0758  BP: (!) 125/55 (!) 107/52 (!) 106/55 (!) 112/48  Pulse:  73 89 88  Resp:  17 17   Temp:  98.1 F (36.7 C) 97.9 F (36.6 C)   TempSrc:  Oral Oral   SpO2:  97% 93% 93%  Weight:      Height:       Weight change:   Physical Examination: General exam: Sleepy but able to wake up  and talk some HEENT:Oral mucosa moist, Ear/Nose WNL grossly Respiratory system: Bilaterally clear BS,no use of accessory muscle Cardiovascular system: S1 & S2 +, No JVD. Gastrointestinal system: Abdomen soft,NT,ND, BS+ Nervous System: sleepy but able to wake up and talk Extremities: LE edema neg,distal peripheral pulses palpable and warm.  Skin: No rashes,no icterus. MSK: Normal muscle bulk,tone, power Foley+   Medications reviewed:  Scheduled Meds:  apixaban  2.5 mg Oral BID   Chlorhexidine Gluconate Cloth  6 each Topical Daily   cyanocobalamin  1,000 mcg Oral Daily   feeding supplement  237 mL Oral BID BM   gabapentin  200 mg Oral QHS   levothyroxine  75 mcg Oral QAC breakfast   lisinopril  5 mg Oral Daily   memantine  5 mg Oral q morning   metoprolol succinate  25 mg Oral Daily   OLANZapine  10 mg Oral QHS   OLANZapine  5 mg Oral q morning   sertraline  50 mg Oral Daily   simvastatin  20 mg Oral QHS   tamsulosin  0.4 mg Oral Daily   traZODone  100 mg Oral QHS  Continuous Infusions:  sodium chloride       Diet Order             Diet regular Room service appropriate? Yes; Fluid consistency: Thin  Diet effective now                    Intake/Output Summary (Last 24 hours) at 08/30/2023 1407 Last data filed at 08/30/2023 1117 Gross per 24 hour  Intake --  Output 600 ml  Net -600 ml   Net IO Since Admission: -5,775.13 mL [08/30/23 1407]  Wt Readings from Last 3 Encounters:  08/25/23 65.3 kg  11/02/22 71.2 kg  09/29/22 72.1 kg     Unresulted Labs (From admission, onward)     Start     Ordered   08/31/23 0500  Basic metabolic panel  Tomorrow morning,   R        08/30/23 1310          Data Reviewed: I have personally reviewed following labs and imaging studies CBC: Recent Labs  Lab 08/24/23 1311 08/25/23 0614 08/28/23 1504  WBC 7.8 7.3 6.3  NEUTROABS 6.1  --   --   HGB 11.8* 10.8* 11.0*  HCT 37.6 34.6* 35.2*  MCV 88.9 88.5 88.7  PLT 177 156 220   Basic Metabolic Panel:  Recent Labs  Lab 08/24/23 1311 08/25/23 0614 08/26/23 0826 08/28/23 1504 08/30/23 1205  NA 140 138 136 141 136  K 4.1 4.8 4.5 4.5 4.1  CL 104 105 106 102 102  CO2 28 25 24 27 26   GLUCOSE 115* 118* 97 138* 136*  BUN 21 33* 33* 27* 46*  CREATININE 1.00 1.37* 1.03* 0.88 1.34*  CALCIUM 8.8* 8.3* 7.6* 9.1 8.2*   GFR: Estimated Creatinine Clearance: 26 mL/min (A) (by C-G formula based on SCr of 1.34 mg/dL (H)). Liver Function Tests:  Recent Labs  Lab 08/24/23 1311  INR 1.2  Sepsis Labs: No results for input(s): "PROCALCITON", "LATICACIDVEN" in the last 168 hours. No results found for this or any previous visit (from the past 240 hours).  Antimicrobials/Microbiology: Anti-infectives (From admission, onward)    None         Component Value Date/Time   SDES BLOOD LEFT ARM 08/27/2021 1720   SPECREQUEST  08/27/2021 1720    BOTTLES DRAWN AEROBIC AND ANAEROBIC Blood Culture results may not be optimal due to an inadequate volume of blood received in culture bottles   CULT  08/27/2021 1720    NO GROWTH 5 DAYS Performed at [facility], [address], [place], [place] [address]    REPTSTATUS 09/01/2021 FINAL  08/27/2021 1720     Radiology Studies: No results found.    LOS: 0 days   Total time spent in review of labs and imaging, patient evaluation, formulation of plan, documentation and communication with family:  35 minutes  Lanae Boast, MD  [facility]  08/30/2023, 2:07 PM

## 2023-08-30 NOTE — Progress Notes (Signed)
 Mobility Specialist Progress Note:    08/30/23 1439  Mobility  Activity Transferred from bed to chair  Level of Assistance Moderate assist, patient does 50-74% (+2)  Assistive Device Stedy  Activity Response Tolerated well  Mobility Referral Yes  Mobility visit 1 Mobility  Mobility Specialist Start Time (ACUTE ONLY) 1400  Mobility Specialist Stop Time (ACUTE ONLY) 1410  Mobility Specialist Time Calculation (min) (ACUTE ONLY) 10 min   Pt received in bed agreeable to mobility. Pt required ModA +2 for bed mobility and STS. No c/o throughout. Transferred to chair w/o fault. Call bell and personal belongings in reach. All needs met. OT in room.  Thompson Grayer Mobility Specialist  Please contact vis Secure Chat or  Rehab Office [phone]

## 2023-08-30 NOTE — Progress Notes (Signed)
 Dr. Jonathon Bellows notified of pt not voiding through the night, bladder scanned 436 MD aware and order placed to insert foley per MD for retention. Once foley placed 600 ml of urine measured.

## 2023-08-30 NOTE — Plan of Care (Signed)
°  Problem: Clinical Measurements: Goal: Will remain free from infection Outcome: Progressing   Problem: Activity: Goal: Risk for activity intolerance will decrease Outcome: Progressing   Problem: Nutrition: Goal: Adequate nutrition will be maintained Outcome: Progressing

## 2023-08-30 NOTE — Progress Notes (Signed)
 OT Cancellation Note  Patient Details Name: [name] MRN: [id number] DOB: June 07, 1936   Cancelled Treatment:    Reason Eval/Treat Not Completed: (P) Fatigue/lethargy limiting ability to participate, Pt resting heavily, not able to wake up after repeated attempts, will return as able.   Alexis Goodell 08/30/2023, 12:16 PM

## 2023-08-30 NOTE — Progress Notes (Signed)
 Occupational Therapy Treatment Patient Details Name: [name] MRN: [id number] DOB: 1936/01/24 Today's Date: 08/30/2023   History of present illness Patient is a 88 year old with fall and hip pain.  Found a mildly displaced and comminuted fracture left pubic body extending to junction of the superior ramus. History of dementia, hypothyroidism, hyperlipidemia, HTN   OT comments  Pt has no complaints, resting in recliner upon entry, mobility had just transferred using Stedy, mod A x2. Attempted to complete 5X sit to stands using Stedy with Pt x1 assist, Pt able to stand up to 20 seconds each time but not able to stand upright enough to use Stedy without x2 assistance. Pt has poor balance, poor activity tolerance, requires consistent verbal cues for participation, poor sequencing and problem solving skills. Pt require set up for feeding, and verbal cueing for task initiation but able to slowly self feed once set up and encouraged to start. Pt would benefit from further acute OT to maximize strength/participation, DC to postacute still appropriate.       If plan is discharge home, recommend the following:  Two people to help with walking and/or transfers;A lot of help with bathing/dressing/bathroom;Assistance with cooking/housework;Assist for transportation;Help with stairs or ramp for entrance;Supervision due to cognitive status;Direct supervision/assist for medications management   Equipment Recommendations  Wheelchair (measurements OT);Wheelchair cushion (measurements OT)    Recommendations for Other Services      Precautions / Restrictions Precautions Precautions: Fall Recall of Precautions/Restrictions: Impaired Restrictions Weight Bearing Restrictions Per Provider Order: No       Mobility Bed Mobility               General bed mobility comments: in recliner    Transfers Overall transfer level: Needs assistance Equipment used: Ambulation equipment used Transfers: Sit  to/from Stand Sit to Stand: Mod assist, +2 physical assistance           General transfer comment: attempted to use Stedy x1 assist, Pt not able to stand upright enough to use Stedy after 5 attempts, got weaker with each stand. Transfer via Lift Equipment: Stedy   Balance Overall balance assessment: Needs assistance Sitting-balance support: No upper extremity supported, Feet supported Sitting balance-Leahy Scale: Fair     Standing balance support: Bilateral upper extremity supported, During functional activity, Reliant on assistive device for balance Standing balance-Leahy Scale: Poor Standing balance comment: reliant on external support, not able to stand fully upright                           ADL either performed or assessed with clinical judgement   ADL Overall ADL's : Needs assistance/impaired Eating/Feeding: Set up;Supervision/ safety;Sitting   Grooming: Set up;Supervision/safety;Sitting                   Toilet Transfer: Moderate assistance;+2 for physical assistance;Rolling walker (2 wheels);BSC/3in1             General ADL Comments: Pt able to follow simple commands inconsistently, poor cognition limits participation and sequencing. Pt mod A x2 for STS using Stedy, attempted several STS's with Stedy after mobility was successful but Pt not able to stand up enough with 1 person assist to use Stedy.    Extremity/Trunk Assessment Upper Extremity Assessment Upper Extremity Assessment: Generalized weakness   Lower Extremity Assessment Lower Extremity Assessment: Defer to PT evaluation        Vision       Perception  Praxis     Communication Communication Communication: Impaired Factors Affecting Communication: Reduced clarity of speech   Cognition Arousal: Alert Behavior During Therapy: Flat affect Cognition: History of cognitive impairments             OT - Cognition Comments: history of advanced dementia, speaks in  tangents, able to follow simple commands and participate, not able to answer questions appropriately.                 Following commands: Impaired Following commands impaired: Follows one step commands with increased time      Cueing   Cueing Techniques: Verbal cues, Tactile cues, Gestural cues  Exercises      Shoulder Instructions       General Comments      Pertinent Vitals/ Pain       Pain Assessment Pain Assessment: No/denies pain  Home Living                                          Prior Functioning/Environment              Frequency  Min 2X/week        Progress Toward Goals  OT Goals(current goals can now be found in the care plan section)  Progress towards OT goals: Progressing toward goals  Acute Rehab OT Goals Patient Stated Goal: not able to participate in goal setting OT Goal Formulation: Patient unable to participate in goal setting Time For Goal Achievement: 09/08/23 Potential to Achieve Goals: Good ADL Goals Pt Will Perform Lower Body Dressing: with mod assist;sitting/lateral leans Pt Will Transfer to Toilet: with min assist;stand pivot transfer;bedside commode Pt Will Perform Toileting - Clothing Manipulation and hygiene: with min assist;sitting/lateral leans  Plan      Co-evaluation                 AM-PAC OT "6 Clicks" Daily Activity     Outcome Measure   Help from another person eating meals?: A Little Help from another person taking care of personal grooming?: A Little Help from another person toileting, which includes using toliet, bedpan, or urinal?: A Lot Help from another person bathing (including washing, rinsing, drying)?: A Lot Help from another person to put on and taking off regular upper body clothing?: A Little Help from another person to put on and taking off regular lower body clothing?: A Lot 6 Click Score: 15    End of Session Equipment Utilized During Treatment: Gait belt;Other  (comment) Antony Salmon)  OT Visit Diagnosis: Unsteadiness on feet (R26.81);Other abnormalities of gait and mobility (R26.89);Muscle weakness (generalized) (M62.81);Other symptoms and signs involving cognitive function;Pain Pain - Right/Left: Left Pain - part of body: Hip   Activity Tolerance Patient tolerated treatment well   Patient Left in chair;with call bell/phone within reach;with chair alarm set;with nursing/sitter in room   Nurse Communication Mobility status        Time: 1610-9604 OT Time Calculation (min): 14 min  Charges: OT General Charges $OT Visit: 1 Visit OT Treatments $Therapeutic Activity: 8-22 mins  [address] OTR/L   Alexis Goodell 08/30/2023, 3:02 PM

## 2023-08-30 NOTE — TOC Progression Note (Signed)
 Transition of Care [facility]) - Progression Note    Patient Details  Name: [name] MRN: [id number] Date of Birth: 01-17-1936  Transition of Care [facility]) CM/SW Contact  Lorri Frederick, LCSW Phone Number: 08/30/2023, 1:05 PM  Clinical Narrative:    CSW confirmed with [facility] that they can receive pt today.     PASSR received: [phone] [id number].      Expected Discharge Plan: Skilled Nursing Facility Barriers to Discharge: Continued Medical Work up, SNF Pending bed offer  Expected Discharge Plan and Services     Post Acute Care Choice: Skilled Nursing Facility Living arrangements for the past 2 months: Single Family Home                                       Social Determinants of Health (SDOH) Interventions SDOH Screenings   Food Insecurity: Patient Unable To Answer (08/25/2023)  Housing: Low Risk  (08/25/2023)  Transportation Needs: Patient Unable To Answer (08/25/2023)  Utilities: Patient Unable To Answer (08/25/2023)  Depression (PHQ2-9): Low Risk  (09/29/2022)  Social Connections: Unknown (08/25/2023)  Tobacco Use: Low Risk  (08/24/2023)    Readmission Risk Interventions    08/31/2021    1:26 PM  Readmission Risk Prevention Plan  Post Dischage Appt Complete  Medication Screening Complete  Transportation Screening Complete

## 2023-08-30 NOTE — Progress Notes (Signed)
 Mobility Specialist Progress Note:    08/30/23 1500  Mobility  Activity Transferred from chair to bed  Level of Assistance +2 (takes two people)  Assistive Device Stedy  Activity Response Tolerated well  Mobility Referral Yes  Mobility visit 1 Mobility  Mobility Specialist Start Time (ACUTE ONLY) 1523  Mobility Specialist Stop Time (ACUTE ONLY) 1536  Mobility Specialist Time Calculation (min) (ACUTE ONLY) 13 min   Pt received in chair and agreeable. Able to stand in stedy w/ modA+2. No complaints throughout. Pt left in bed with call bell and all needs met. Bed alarm on.  [place] Earlene Plater Mobility Specialist Please contact via Special educational needs teacher or Rehab office at [phone]

## 2023-08-31 DIAGNOSIS — Z7901 Long term (current) use of anticoagulants: Secondary | ICD-10-CM | POA: Diagnosis not present

## 2023-08-31 DIAGNOSIS — D72829 Elevated white blood cell count, unspecified: Secondary | ICD-10-CM | POA: Diagnosis not present

## 2023-08-31 DIAGNOSIS — S72002D Fracture of unspecified part of neck of left femur, subsequent encounter for closed fracture with routine healing: Secondary | ICD-10-CM | POA: Diagnosis not present

## 2023-08-31 DIAGNOSIS — F0390 Unspecified dementia without behavioral disturbance: Secondary | ICD-10-CM | POA: Diagnosis not present

## 2023-08-31 DIAGNOSIS — R262 Difficulty in walking, not elsewhere classified: Secondary | ICD-10-CM | POA: Diagnosis not present

## 2023-08-31 DIAGNOSIS — R4701 Aphasia: Secondary | ICD-10-CM | POA: Diagnosis not present

## 2023-08-31 DIAGNOSIS — I10 Essential (primary) hypertension: Secondary | ICD-10-CM | POA: Diagnosis not present

## 2023-08-31 DIAGNOSIS — S3289XA Fracture of other parts of pelvis, initial encounter for closed fracture: Secondary | ICD-10-CM | POA: Diagnosis not present

## 2023-08-31 DIAGNOSIS — Z79899 Other long term (current) drug therapy: Secondary | ICD-10-CM | POA: Diagnosis not present

## 2023-08-31 DIAGNOSIS — R531 Weakness: Secondary | ICD-10-CM | POA: Diagnosis not present

## 2023-08-31 DIAGNOSIS — E039 Hypothyroidism, unspecified: Secondary | ICD-10-CM | POA: Diagnosis not present

## 2023-08-31 DIAGNOSIS — J189 Pneumonia, unspecified organism: Secondary | ICD-10-CM | POA: Diagnosis not present

## 2023-08-31 DIAGNOSIS — Z7401 Bed confinement status: Secondary | ICD-10-CM | POA: Diagnosis not present

## 2023-08-31 DIAGNOSIS — S32592D Other specified fracture of left pubis, subsequent encounter for fracture with routine healing: Secondary | ICD-10-CM | POA: Diagnosis not present

## 2023-08-31 DIAGNOSIS — F32A Depression, unspecified: Secondary | ICD-10-CM | POA: Diagnosis not present

## 2023-08-31 DIAGNOSIS — N179 Acute kidney failure, unspecified: Secondary | ICD-10-CM | POA: Diagnosis not present

## 2023-08-31 DIAGNOSIS — Z86711 Personal history of pulmonary embolism: Secondary | ICD-10-CM | POA: Diagnosis not present

## 2023-08-31 DIAGNOSIS — H9190 Unspecified hearing loss, unspecified ear: Secondary | ICD-10-CM | POA: Diagnosis not present

## 2023-08-31 DIAGNOSIS — I959 Hypotension, unspecified: Secondary | ICD-10-CM | POA: Diagnosis not present

## 2023-08-31 DIAGNOSIS — S329XXA Fracture of unspecified parts of lumbosacral spine and pelvis, initial encounter for closed fracture: Secondary | ICD-10-CM | POA: Diagnosis not present

## 2023-08-31 DIAGNOSIS — R278 Other lack of coordination: Secondary | ICD-10-CM | POA: Diagnosis not present

## 2023-08-31 DIAGNOSIS — M6281 Muscle weakness (generalized): Secondary | ICD-10-CM | POA: Diagnosis not present

## 2023-08-31 LAB — BASIC METABOLIC PANEL
Anion gap: 4 — ABNORMAL LOW (ref 5–15)
BUN: 32 mg/dL — ABNORMAL HIGH (ref 8–23)
CO2: 26 mmol/L (ref 22–32)
Calcium: 8.1 mg/dL — ABNORMAL LOW (ref 8.9–10.3)
Chloride: 108 mmol/L (ref 98–111)
Creatinine, Ser: 1.07 mg/dL — ABNORMAL HIGH (ref 0.44–1.00)
GFR, Estimated: 51 mL/min — ABNORMAL LOW (ref >60)
Glucose, Bld: 97 mg/dL (ref 70–99)
Potassium: 4 mmol/L (ref 3.5–5.1)
Sodium: 138 mmol/L (ref 135–145)

## 2023-08-31 NOTE — TOC Transition Note (Signed)
 Transition of Care [facility]) - Discharge Note   Patient Details  Name: [name] MRN: [id number] Date of Birth: 06-14-1936  Transition of Care [facility]) CM/SW Contact:  Lorri Frederick, LCSW Phone Number: 08/31/2023, 11:22 AM   Clinical Narrative:   Pt discharging to [facility], room 610B.  RN call report to [phone]. [phone] is main number)  1000: CSW confirmed with [facility] that they can receive pt today.    Final next level of care: Skilled Nursing Facility Barriers to Discharge: Barriers Resolved   Patient Goals and CMS Choice   CMS Medicare.gov Compare Post Acute Care list provided to:: Patient Represenative (must comment) (grandson Vivi Ferns)        Discharge Placement              Patient chooses bed at: [facility] Patient to be transferred to facility by: ptar Name of family member notified: granddaughter [name] Patient and family notified of of transfer: 08/31/23  Discharge Plan and Services Additional resources added to the After Visit Summary for       Post Acute Care Choice: Skilled Nursing Facility                               Social Drivers of Health (SDOH) Interventions SDOH Screenings   Food Insecurity: Patient Unable To Answer (08/25/2023)  Housing: Low Risk  (08/25/2023)  Transportation Needs: Patient Unable To Answer (08/25/2023)  Utilities: Patient Unable To Answer (08/25/2023)  Depression (PHQ2-9): Low Risk  (09/29/2022)  Social Connections: Unknown (08/25/2023)  Tobacco Use: Low Risk  (08/24/2023)     Readmission Risk Interventions    08/31/2021    1:26 PM  Readmission Risk Prevention Plan  Post Dischage Appt Complete  Medication Screening Complete  Transportation Screening Complete

## 2023-08-31 NOTE — Progress Notes (Signed)
 [facility] facility called to give report, unable to reach left message with nursing supervisor and call back number.   Attempted to call [name] to notify of pt DC to facility unable to reach

## 2023-08-31 NOTE — Progress Notes (Signed)
 [facility] facility called back, report given to [place], LPN.  [facility] arrived to transport, all personal belonging taken.  [name] notified of pt leaving facility with [facility].

## 2023-08-31 NOTE — Discharge Summary (Signed)
 Physician Discharge Summary  [name] WGN:[id number] DOB: 05-25-1936 DOA: 08/24/2023  PCP: Camie Patience, FNP  Admit date: 08/24/2023 Discharge date: 08/31/2023 Recommendations for Outpatient Follow-up:  Follow up with PCP in 1 weeks-call for appointment- Please obtain BMP/CBC in one week Please do voiding trial on with Foley removal on 3/17 and follow-up with [facility]-please call office.  Discharge Dispo: SNF Discharge Condition: Stable Code Status:   Code Status: Full Code Diet recommendation:  Diet Order             Diet regular Room service appropriate? Yes; Fluid consistency: Thin  Diet effective now                    Brief/Interim Summary: 88 year old female with history of dementia, hypothyroidism, hyperlipidemia, HTN, on Eliquis who had a fall 08/23/2023 and having difficulty getting out of bed and pain in her right hip so brought to the ED for further evaluation and found to have mildly displaced and comminuted fracture of the left pubic body and admitted for for pain management as fracture deemed to be stable and weight bearing as tolerated.  In ED- CXR/CT head no acute finding, CT head showed designated volume loss chronic small vessel ischemic changes X-ray, pelvis and CT pelvis> Mildly displaced and comminuted fracture of the left pubic body extending to the junction of the superior ramus. Fracture extends to the upper aspect of the pubic symphysis.Left hip arthroplasty without complication.Lucency through the anterior superior endplate of L4 vertebral body with slight superior endplate compression deformity is age indeterminate. Question of soft tissue thickening adjacent to the L4 fracture. Recommend correlation with focal tenderness -Patient had acute renal failure with slight bump in creatinine given IV fluids with improvement.  She had urine retention PRN multiple-oh and subsequently Foley catheter placed.  Patient had mild AKI that improved with IV fluid  hydration and Foley catheter placement. . Seen by PT OT, recommending skilled nursing facility and awaiting placement.    Discharge Diagnoses:  Principal Problem:   Pelvic fracture (HCC) Active Problems:   Essential hypertension   History of pulmonary embolism   Acquired hypothyroidism   Dementia with behavioral disturbance   Depression   Hearing loss  Left Pelvic fracture Fall at home: Patient had a fall at home imaging showed mildly displaced and comminuted fracture of the left pubic body, discussed with Earney Hamburg from orthopedics plan is to pain control PT OT and weightbearing as tolerated as advised by ED.  Patient was previously followed by [facility] orthopedics will need to have outpatient follow-up with them. Continue PT OT, plan SNF. Initially granddaughter was planning to take her home but she has been needing significant assistance.  AKI on CKD 3a Urine retention-multiple episodes: creat up 1.37 from baseline 1.0 w/ ckd 3a and had improved with IV fluids. Again having urine retention issues, nursing reports they tried to help her void but has not been able, subsequently Foley catheter placed  3/11 also given IV fluids overnight with improvement in renal function.  At this time she is stable for discharge . Continue Flomax will keep Foley catheter x 1 week-and will need voiding trial in 1 week. I called [facility] referral line to expedite follow-up-advised to call main no by family- phone no provided in dc   Essential hypertension: BP is stable continue Toprol resume lisinopril   History of pulmonary embolism: Cont home Eliquis   Acquired hypothyroidism: Cont her synthroid   Dementia with behavioral disturbance  Depression Hearing loss: Mood stable pleasantly confused with baseline dementia. Please feed with assistance for dementia patient Continue her home Memantine and Zoloft trazodone, zyprexa, vitamin supplement Keep on delirium precaution fall  precaution   HLD: Cont statins.  Subjective: Seen and examined this morning,Sleepy but able to wake up briefly opens her eyes.  Normally she wakes up alert as per the granddaughter Yesterday after she woke up she did eat well per nursing staff  Discharge Exam: Vitals:   08/31/23 0441 08/31/23 0711  BP: (!) 149/58 (!) 150/63  Pulse: 71 67  Resp: 17   Temp: 98.3 F (36.8 C)   SpO2: 92% 94%   General: Pt is sleepy able to wake up Cardiovascular: RRR, S1/S2 +, no rubs, no gallops Respiratory: CTA bilaterally, no wheezing, no rhonchi Abdominal: Soft, NT, ND, bowel sounds + Extremities: no edema, no cyanosis  Discharge Instructions  Discharge Instructions     Discharge instructions   Complete by: As directed    Please call call MD or return to ER for similar or worsening recurring problem that brought you to hospital or if any fever,nausea/vomiting,abdominal pain, uncontrolled pain, chest pain,  shortness of breath or any other alarming symptoms.  Please follow-up your doctor as instructed in a week time and call the office for appointment.  Please avoid alcohol, smoking, or any other illicit substance and maintain healthy habits including taking your regular medications as prescribed.  You were cared for by a hospitalist during your hospital stay. If you have any questions about your discharge medications or the care you received while you were in the hospital after you are discharged, you can call the unit and ask to speak with the hospitalist on call if the hospitalist that took care of you is not available.  Once you are discharged, your primary care physician will handle any further medical issues. Please note that NO REFILLS for any discharge medications will be authorized once you are discharged, as it is imperative that you return to your primary care physician (or establish a relationship with a primary care physician if you do not have one) for your aftercare needs so that  they can reassess your need for medications and monitor your lab values   Increase activity slowly   Complete by: As directed       Allergies as of 08/31/2023       Reactions   Donepezil Other (See Comments)   Hallucinations and sleep disturbances   Escitalopram Oxalate Other (See Comments)   Reaction type/severity unknown   Hydrochlorothiazide Other (See Comments)   Reaction type/severity unknown        Medication List     TAKE these medications    cyanocobalamin 1000 MCG tablet Commonly known as: VITAMIN B12 Take 1,000 mcg by mouth daily.   Eliquis 2.5 MG Tabs tablet Generic drug: apixaban Take 2.5 mg by mouth 2 (two) times daily.   gabapentin 100 MG capsule Commonly known as: Neurontin Take 1 cap in AM, 2 caps in PM   levothyroxine 75 MCG tablet Commonly known as: SYNTHROID Take 75 mcg by mouth daily before breakfast.   lisinopril 5 MG tablet Commonly known as: ZESTRIL Take 5 mg by mouth daily.   memantine 5 MG tablet Commonly known as: NAMENDA TAKE 1 TABLET EVERY MORNING   metoprolol succinate 25 MG 24 hr tablet Commonly known as: TOPROL-XL Take 25 mg by mouth daily.   OLANZapine 10 MG tablet Commonly known as: ZYPREXA TAKE 1/2 TABLET IN  THE MORNING AND TAKE 1 TABLET AT BEDTIME   sertraline 50 MG tablet Commonly known as: ZOLOFT Take 1 tablet (50 mg total) by mouth daily.   simvastatin 20 MG tablet Commonly known as: ZOCOR Take 20 mg by mouth at bedtime.   traZODone 50 MG tablet Commonly known as: DESYREL TAKE 2 TABLETS AT BEDTIME   Vitamin D3 25 MCG (1000 UT) Caps Take 1,000 Units by mouth daily.        Follow-up Information     Camie Patience, FNP Follow up.   Specialty: Family Medicine Contact information: [address] [place] [address] [phone]         [facility] Follow up in 1 week(s).   Contact information: [address] [place]  [address] [phone]        [facility] Follow up in 1 week(s).   Why: Due to Foley catheter Contact information: [address] [place] [address] [phone]               Allergies  Allergen Reactions   Donepezil Other (See Comments)    Hallucinations and sleep disturbances   Escitalopram Oxalate Other (See Comments)    Reaction type/severity unknown   Hydrochlorothiazide Other (See Comments)    Reaction type/severity unknown     The results of significant diagnostics from this hospitalization (including imaging, microbiology, ancillary and laboratory) are listed below for reference.    Microbiology: No results found for this or any previous visit (from the past 240 hours).  Procedures/Studies: CT PELVIS WO CONTRAST Result Date: 08/24/2023 CLINICAL DATA:  Hip trauma, fracture suspected, xray done EXAM: CT PELVIS WITHOUT CONTRAST TECHNIQUE: Multidetector CT imaging of the pelvis was performed following the standard protocol without intravenous contrast. RADIATION DOSE REDUCTION: This exam was performed according to the departmental dose-optimization program which includes automated exposure control, adjustment of the mA and/or kV according to patient size and/or use of iterative reconstruction technique. COMPARISON:  Radiograph earlier today FINDINGS: Bones/Joint/Cartilage Mildly displaced and comminuted fracture of the left pubic body extending to the junction of the superior ramus. Fracture extends to the upper aspect of the pubic symphysis. No definite inferior ramus fracture. There is no associated sacral fracture. Left hip arthroplasty is intact, no periprosthetic fracture or lucency. No acute fracture of the right hip or hemipelvis. Moderate right hip osteoarthritis with joint space narrowing and spurring. Lucency through the anterior superior endplate of L4 vertebral body with slight superior endplate compression deformity is age  indeterminate. Ligaments Suboptimally assessed by CT. Muscles and Tendons Heterogeneous enlargement of the left obturator muscles likely related to adjacent fracture. Soft tissues Colonic diverticulosis. Aortic atherosclerosis. Question of soft tissue thickening adjacent to the L4 fracture. IMPRESSION: 1. Mildly displaced and comminuted fracture of the left pubic body extending to the junction of the superior ramus. Fracture extends to the upper aspect of the pubic symphysis. 2. Left hip arthroplasty without complication. 3. Lucency through the anterior superior endplate of L4 vertebral body with slight superior endplate compression deformity is age indeterminate. Question of soft tissue thickening adjacent to the L4 fracture. Recommend correlation with focal tenderness. Electronically Signed   By: Narda Rutherford M.D.   On: 08/24/2023 16:34   DG Hip Unilat With Pelvis 2-3 Views Right Result Date: 08/24/2023 CLINICAL DATA:  Hip pain after fall. EXAM: DG HIP (WITH OR WITHOUT PELVIS) 2-3V RIGHT COMPARISON:  None Available. FINDINGS: Cortical irregularity about  the left pubic body at the superior ramus body junction suspicious for minimally displaced fracture. No definite inferior ramus fracture. No fracture of the right hip. No hip dislocation. Moderate right hip osteoarthritis with joint space narrowing and spurring. No pubic symphyseal or sacroiliac diastasis. Previous left hip arthroplasty. IMPRESSION: Cortical irregularity about the left pubic body at the superior ramus junction, suspicious for fracture. Electronically Signed   By: Narda Rutherford M.D.   On: 08/24/2023 16:26   DG Chest 1 View Result Date: 08/24/2023 CLINICAL DATA:  Fall with hip pain. EXAM: CHEST  1 VIEW COMPARISON:  Chest radiograph 09/05/2022 FINDINGS: Patient is rotated. The cardiomediastinal contours are normal. Minimal aortic atherosclerosis. Pulmonary vasculature is normal. No consolidation, pleural effusion, or pneumothorax. No acute  osseous abnormalities are seen. IMPRESSION: No active disease. Electronically Signed   By: Narda Rutherford M.D.   On: 08/24/2023 16:24   CT HEAD WO CONTRAST Result Date: 08/24/2023 CLINICAL DATA:  Head trauma, minor.  Witnessed fall yesterday. EXAM: CT HEAD WITHOUT CONTRAST TECHNIQUE: Contiguous axial images were obtained from the base of the skull through the vertex without intravenous contrast. RADIATION DOSE REDUCTION: This exam was performed according to the departmental dose-optimization program which includes automated exposure control, adjustment of the mA and/or kV according to patient size and/or use of iterative reconstruction technique. COMPARISON:  04/05/2023 FINDINGS: Brain: Age related volume loss. Chronic small-vessel ischemic change of the cerebral hemispheric white matter. No sign of acute infarction, mass lesion, hemorrhage, hydrocephalus or extra-axial collection. Vascular: There is atherosclerotic calcification of the major vessels at the base of the brain. Skull: Negative Sinuses/Orbits: Clear/normal Other: None IMPRESSION: No acute or traumatic finding. Age related volume loss. Chronic small-vessel ischemic change of the cerebral hemispheric white matter. Electronically Signed   By: Paulina Fusi M.D.   On: 08/24/2023 14:36   Labs: BNP (last 3 results) No results for input(s): "BNP" in the last 8760 hours. Basic Metabolic Panel: Recent Labs  Lab 08/25/23 0614 08/26/23 0826 08/28/23 1504 08/30/23 1205 08/31/23 0526  NA 138 136 141 136 138  K 4.8 4.5 4.5 4.1 4.0  CL 105 106 102 102 108  CO2 25 24 27 26 26   GLUCOSE 118* 97 138* 136* 97  BUN 33* 33* 27* 46* 32*  CREATININE 1.37* 1.03* 0.88 1.34* 1.07*  CALCIUM 8.3* 7.6* 9.1 8.2* 8.1*   Recent Labs  Lab 08/24/23 1311 08/25/23 0614 08/28/23 1504  WBC 7.8 7.3 6.3  NEUTROABS 6.1  --   --   HGB 11.8* 10.8* 11.0*  HCT 37.6 34.6* 35.2*  MCV 88.9 88.5 88.7  PLT 177 156 220  No results for input(s): "VITAMINB12", "FOLATE",  "FERRITIN", "TIBC", "IRON", "RETICCTPCT" in the last 72 hours. Urinalysis    Component Value Date/Time   COLORURINE YELLOW 09/05/2022 1932   APPEARANCEUR CLEAR 09/05/2022 1932   LABSPEC 1.029 09/05/2022 1932   PHURINE 5.0 09/05/2022 1932   GLUCOSEU NEGATIVE 09/05/2022 1932   HGBUR NEGATIVE 09/05/2022 1932   BILIRUBINUR NEGATIVE 09/05/2022 1932   KETONESUR NEGATIVE 09/05/2022 1932   PROTEINUR NEGATIVE 09/05/2022 1932   NITRITE NEGATIVE 09/05/2022 1932   LEUKOCYTESUR NEGATIVE 09/05/2022 1932   Sepsis Labs Recent Labs  Lab 08/24/23 1311 08/25/23 0614 08/28/23 1504  WBC 7.8 7.3 6.3   Microbiology No results found for this or any previous visit (from the past 240 hours).  Time coordinating discharge: 25 minutes  SIGNED: Lanae Boast, MD  [facility] 08/31/2023, 11:00 AM  If 7PM-7AM, please contact night-coverage [email or web address]

## 2023-08-31 NOTE — Plan of Care (Signed)

## 2023-09-01 DIAGNOSIS — Z7901 Long term (current) use of anticoagulants: Secondary | ICD-10-CM | POA: Diagnosis not present

## 2023-09-01 DIAGNOSIS — E039 Hypothyroidism, unspecified: Secondary | ICD-10-CM | POA: Diagnosis not present

## 2023-09-01 DIAGNOSIS — S72002D Fracture of unspecified part of neck of left femur, subsequent encounter for closed fracture with routine healing: Secondary | ICD-10-CM | POA: Diagnosis not present

## 2023-09-01 DIAGNOSIS — I10 Essential (primary) hypertension: Secondary | ICD-10-CM | POA: Diagnosis not present

## 2023-09-06 DIAGNOSIS — R296 Repeated falls: Secondary | ICD-10-CM | POA: Diagnosis not present

## 2023-09-06 DIAGNOSIS — F0390 Unspecified dementia without behavioral disturbance: Secondary | ICD-10-CM | POA: Diagnosis not present

## 2023-09-06 DIAGNOSIS — N1831 Chronic kidney disease, stage 3a: Secondary | ICD-10-CM | POA: Diagnosis not present

## 2023-09-06 DIAGNOSIS — S329XXD Fracture of unspecified parts of lumbosacral spine and pelvis, subsequent encounter for fracture with routine healing: Secondary | ICD-10-CM | POA: Diagnosis not present

## 2023-09-06 DIAGNOSIS — Z978 Presence of other specified devices: Secondary | ICD-10-CM | POA: Diagnosis not present

## 2023-09-06 DIAGNOSIS — I10 Essential (primary) hypertension: Secondary | ICD-10-CM | POA: Diagnosis not present

## 2023-09-06 DIAGNOSIS — Z7409 Other reduced mobility: Secondary | ICD-10-CM | POA: Diagnosis not present

## 2023-09-11 DIAGNOSIS — R339 Retention of urine, unspecified: Secondary | ICD-10-CM | POA: Diagnosis not present

## 2023-09-11 DIAGNOSIS — F320 Major depressive disorder, single episode, mild: Secondary | ICD-10-CM | POA: Diagnosis not present

## 2023-09-11 DIAGNOSIS — F0284 Dementia in other diseases classified elsewhere, unspecified severity, with anxiety: Secondary | ICD-10-CM | POA: Diagnosis not present

## 2023-09-11 DIAGNOSIS — Z466 Encounter for fitting and adjustment of urinary device: Secondary | ICD-10-CM | POA: Diagnosis not present

## 2023-09-11 DIAGNOSIS — I129 Hypertensive chronic kidney disease with stage 1 through stage 4 chronic kidney disease, or unspecified chronic kidney disease: Secondary | ICD-10-CM | POA: Diagnosis not present

## 2023-09-11 DIAGNOSIS — G309 Alzheimer's disease, unspecified: Secondary | ICD-10-CM | POA: Diagnosis not present

## 2023-09-11 DIAGNOSIS — F0283 Dementia in other diseases classified elsewhere, unspecified severity, with mood disturbance: Secondary | ICD-10-CM | POA: Diagnosis not present

## 2023-09-11 DIAGNOSIS — S32592D Other specified fracture of left pubis, subsequent encounter for fracture with routine healing: Secondary | ICD-10-CM | POA: Diagnosis not present

## 2023-09-11 DIAGNOSIS — E039 Hypothyroidism, unspecified: Secondary | ICD-10-CM | POA: Diagnosis not present

## 2023-09-12 DIAGNOSIS — R339 Retention of urine, unspecified: Secondary | ICD-10-CM | POA: Diagnosis not present

## 2023-09-12 DIAGNOSIS — F0283 Dementia in other diseases classified elsewhere, unspecified severity, with mood disturbance: Secondary | ICD-10-CM | POA: Diagnosis not present

## 2023-09-12 DIAGNOSIS — I129 Hypertensive chronic kidney disease with stage 1 through stage 4 chronic kidney disease, or unspecified chronic kidney disease: Secondary | ICD-10-CM | POA: Diagnosis not present

## 2023-09-12 DIAGNOSIS — S32592D Other specified fracture of left pubis, subsequent encounter for fracture with routine healing: Secondary | ICD-10-CM | POA: Diagnosis not present

## 2023-09-12 DIAGNOSIS — F320 Major depressive disorder, single episode, mild: Secondary | ICD-10-CM | POA: Diagnosis not present

## 2023-09-12 DIAGNOSIS — F0284 Dementia in other diseases classified elsewhere, unspecified severity, with anxiety: Secondary | ICD-10-CM | POA: Diagnosis not present

## 2023-09-12 DIAGNOSIS — Z466 Encounter for fitting and adjustment of urinary device: Secondary | ICD-10-CM | POA: Diagnosis not present

## 2023-09-12 DIAGNOSIS — E039 Hypothyroidism, unspecified: Secondary | ICD-10-CM | POA: Diagnosis not present

## 2023-09-12 DIAGNOSIS — G309 Alzheimer's disease, unspecified: Secondary | ICD-10-CM | POA: Diagnosis not present

## 2023-09-13 DIAGNOSIS — S32592D Other specified fracture of left pubis, subsequent encounter for fracture with routine healing: Secondary | ICD-10-CM | POA: Diagnosis not present

## 2023-09-13 DIAGNOSIS — S329XXD Fracture of unspecified parts of lumbosacral spine and pelvis, subsequent encounter for fracture with routine healing: Secondary | ICD-10-CM | POA: Diagnosis not present

## 2023-09-13 DIAGNOSIS — F0283 Dementia in other diseases classified elsewhere, unspecified severity, with mood disturbance: Secondary | ICD-10-CM | POA: Diagnosis not present

## 2023-09-13 DIAGNOSIS — F0284 Dementia in other diseases classified elsewhere, unspecified severity, with anxiety: Secondary | ICD-10-CM | POA: Diagnosis not present

## 2023-09-13 DIAGNOSIS — F320 Major depressive disorder, single episode, mild: Secondary | ICD-10-CM | POA: Diagnosis not present

## 2023-09-13 DIAGNOSIS — G309 Alzheimer's disease, unspecified: Secondary | ICD-10-CM | POA: Diagnosis not present

## 2023-09-13 DIAGNOSIS — I129 Hypertensive chronic kidney disease with stage 1 through stage 4 chronic kidney disease, or unspecified chronic kidney disease: Secondary | ICD-10-CM | POA: Diagnosis not present

## 2023-09-13 DIAGNOSIS — R339 Retention of urine, unspecified: Secondary | ICD-10-CM | POA: Diagnosis not present

## 2023-09-13 DIAGNOSIS — Z466 Encounter for fitting and adjustment of urinary device: Secondary | ICD-10-CM | POA: Diagnosis not present

## 2023-09-13 DIAGNOSIS — E039 Hypothyroidism, unspecified: Secondary | ICD-10-CM | POA: Diagnosis not present

## 2023-09-15 DIAGNOSIS — Z466 Encounter for fitting and adjustment of urinary device: Secondary | ICD-10-CM | POA: Diagnosis not present

## 2023-09-15 DIAGNOSIS — I129 Hypertensive chronic kidney disease with stage 1 through stage 4 chronic kidney disease, or unspecified chronic kidney disease: Secondary | ICD-10-CM | POA: Diagnosis not present

## 2023-09-15 DIAGNOSIS — G309 Alzheimer's disease, unspecified: Secondary | ICD-10-CM | POA: Diagnosis not present

## 2023-09-15 DIAGNOSIS — E039 Hypothyroidism, unspecified: Secondary | ICD-10-CM | POA: Diagnosis not present

## 2023-09-15 DIAGNOSIS — F0283 Dementia in other diseases classified elsewhere, unspecified severity, with mood disturbance: Secondary | ICD-10-CM | POA: Diagnosis not present

## 2023-09-15 DIAGNOSIS — R339 Retention of urine, unspecified: Secondary | ICD-10-CM | POA: Diagnosis not present

## 2023-09-15 DIAGNOSIS — F0284 Dementia in other diseases classified elsewhere, unspecified severity, with anxiety: Secondary | ICD-10-CM | POA: Diagnosis not present

## 2023-09-15 DIAGNOSIS — S32592D Other specified fracture of left pubis, subsequent encounter for fracture with routine healing: Secondary | ICD-10-CM | POA: Diagnosis not present

## 2023-09-15 DIAGNOSIS — F320 Major depressive disorder, single episode, mild: Secondary | ICD-10-CM | POA: Diagnosis not present

## 2023-09-19 DIAGNOSIS — E039 Hypothyroidism, unspecified: Secondary | ICD-10-CM | POA: Diagnosis not present

## 2023-09-19 DIAGNOSIS — I129 Hypertensive chronic kidney disease with stage 1 through stage 4 chronic kidney disease, or unspecified chronic kidney disease: Secondary | ICD-10-CM | POA: Diagnosis not present

## 2023-09-19 DIAGNOSIS — G309 Alzheimer's disease, unspecified: Secondary | ICD-10-CM | POA: Diagnosis not present

## 2023-09-19 DIAGNOSIS — F320 Major depressive disorder, single episode, mild: Secondary | ICD-10-CM | POA: Diagnosis not present

## 2023-09-19 DIAGNOSIS — R339 Retention of urine, unspecified: Secondary | ICD-10-CM | POA: Diagnosis not present

## 2023-09-19 DIAGNOSIS — F0284 Dementia in other diseases classified elsewhere, unspecified severity, with anxiety: Secondary | ICD-10-CM | POA: Diagnosis not present

## 2023-09-19 DIAGNOSIS — F0283 Dementia in other diseases classified elsewhere, unspecified severity, with mood disturbance: Secondary | ICD-10-CM | POA: Diagnosis not present

## 2023-09-19 DIAGNOSIS — Z466 Encounter for fitting and adjustment of urinary device: Secondary | ICD-10-CM | POA: Diagnosis not present

## 2023-09-19 DIAGNOSIS — S32592D Other specified fracture of left pubis, subsequent encounter for fracture with routine healing: Secondary | ICD-10-CM | POA: Diagnosis not present

## 2023-09-21 DIAGNOSIS — I129 Hypertensive chronic kidney disease with stage 1 through stage 4 chronic kidney disease, or unspecified chronic kidney disease: Secondary | ICD-10-CM | POA: Diagnosis not present

## 2023-09-21 DIAGNOSIS — F0283 Dementia in other diseases classified elsewhere, unspecified severity, with mood disturbance: Secondary | ICD-10-CM | POA: Diagnosis not present

## 2023-09-21 DIAGNOSIS — E039 Hypothyroidism, unspecified: Secondary | ICD-10-CM | POA: Diagnosis not present

## 2023-09-21 DIAGNOSIS — Z466 Encounter for fitting and adjustment of urinary device: Secondary | ICD-10-CM | POA: Diagnosis not present

## 2023-09-21 DIAGNOSIS — R339 Retention of urine, unspecified: Secondary | ICD-10-CM | POA: Diagnosis not present

## 2023-09-21 DIAGNOSIS — F320 Major depressive disorder, single episode, mild: Secondary | ICD-10-CM | POA: Diagnosis not present

## 2023-09-21 DIAGNOSIS — S32592D Other specified fracture of left pubis, subsequent encounter for fracture with routine healing: Secondary | ICD-10-CM | POA: Diagnosis not present

## 2023-09-21 DIAGNOSIS — G309 Alzheimer's disease, unspecified: Secondary | ICD-10-CM | POA: Diagnosis not present

## 2023-09-21 DIAGNOSIS — F0284 Dementia in other diseases classified elsewhere, unspecified severity, with anxiety: Secondary | ICD-10-CM | POA: Diagnosis not present

## 2023-09-22 DIAGNOSIS — F0284 Dementia in other diseases classified elsewhere, unspecified severity, with anxiety: Secondary | ICD-10-CM | POA: Diagnosis not present

## 2023-09-22 DIAGNOSIS — G309 Alzheimer's disease, unspecified: Secondary | ICD-10-CM | POA: Diagnosis not present

## 2023-09-22 DIAGNOSIS — Z466 Encounter for fitting and adjustment of urinary device: Secondary | ICD-10-CM | POA: Diagnosis not present

## 2023-09-22 DIAGNOSIS — R339 Retention of urine, unspecified: Secondary | ICD-10-CM | POA: Diagnosis not present

## 2023-09-22 DIAGNOSIS — E039 Hypothyroidism, unspecified: Secondary | ICD-10-CM | POA: Diagnosis not present

## 2023-09-22 DIAGNOSIS — F0283 Dementia in other diseases classified elsewhere, unspecified severity, with mood disturbance: Secondary | ICD-10-CM | POA: Diagnosis not present

## 2023-09-22 DIAGNOSIS — F320 Major depressive disorder, single episode, mild: Secondary | ICD-10-CM | POA: Diagnosis not present

## 2023-09-22 DIAGNOSIS — I129 Hypertensive chronic kidney disease with stage 1 through stage 4 chronic kidney disease, or unspecified chronic kidney disease: Secondary | ICD-10-CM | POA: Diagnosis not present

## 2023-09-22 DIAGNOSIS — S32592D Other specified fracture of left pubis, subsequent encounter for fracture with routine healing: Secondary | ICD-10-CM | POA: Diagnosis not present

## 2023-09-23 DIAGNOSIS — F0283 Dementia in other diseases classified elsewhere, unspecified severity, with mood disturbance: Secondary | ICD-10-CM | POA: Diagnosis not present

## 2023-09-23 DIAGNOSIS — I129 Hypertensive chronic kidney disease with stage 1 through stage 4 chronic kidney disease, or unspecified chronic kidney disease: Secondary | ICD-10-CM | POA: Diagnosis not present

## 2023-09-23 DIAGNOSIS — F0284 Dementia in other diseases classified elsewhere, unspecified severity, with anxiety: Secondary | ICD-10-CM | POA: Diagnosis not present

## 2023-09-23 DIAGNOSIS — Z466 Encounter for fitting and adjustment of urinary device: Secondary | ICD-10-CM | POA: Diagnosis not present

## 2023-09-23 DIAGNOSIS — F320 Major depressive disorder, single episode, mild: Secondary | ICD-10-CM | POA: Diagnosis not present

## 2023-09-23 DIAGNOSIS — G309 Alzheimer's disease, unspecified: Secondary | ICD-10-CM | POA: Diagnosis not present

## 2023-09-23 DIAGNOSIS — S32592D Other specified fracture of left pubis, subsequent encounter for fracture with routine healing: Secondary | ICD-10-CM | POA: Diagnosis not present

## 2023-09-23 DIAGNOSIS — E039 Hypothyroidism, unspecified: Secondary | ICD-10-CM | POA: Diagnosis not present

## 2023-09-23 DIAGNOSIS — R339 Retention of urine, unspecified: Secondary | ICD-10-CM | POA: Diagnosis not present

## 2023-09-26 DIAGNOSIS — F0284 Dementia in other diseases classified elsewhere, unspecified severity, with anxiety: Secondary | ICD-10-CM | POA: Diagnosis not present

## 2023-09-26 DIAGNOSIS — G309 Alzheimer's disease, unspecified: Secondary | ICD-10-CM | POA: Diagnosis not present

## 2023-09-26 DIAGNOSIS — F320 Major depressive disorder, single episode, mild: Secondary | ICD-10-CM | POA: Diagnosis not present

## 2023-09-26 DIAGNOSIS — F0283 Dementia in other diseases classified elsewhere, unspecified severity, with mood disturbance: Secondary | ICD-10-CM | POA: Diagnosis not present

## 2023-09-26 DIAGNOSIS — E039 Hypothyroidism, unspecified: Secondary | ICD-10-CM | POA: Diagnosis not present

## 2023-09-26 DIAGNOSIS — S32592D Other specified fracture of left pubis, subsequent encounter for fracture with routine healing: Secondary | ICD-10-CM | POA: Diagnosis not present

## 2023-09-26 DIAGNOSIS — I129 Hypertensive chronic kidney disease with stage 1 through stage 4 chronic kidney disease, or unspecified chronic kidney disease: Secondary | ICD-10-CM | POA: Diagnosis not present

## 2023-09-26 DIAGNOSIS — R339 Retention of urine, unspecified: Secondary | ICD-10-CM | POA: Diagnosis not present

## 2023-09-26 DIAGNOSIS — Z466 Encounter for fitting and adjustment of urinary device: Secondary | ICD-10-CM | POA: Diagnosis not present

## 2023-09-28 DIAGNOSIS — Z466 Encounter for fitting and adjustment of urinary device: Secondary | ICD-10-CM | POA: Diagnosis not present

## 2023-09-28 DIAGNOSIS — R339 Retention of urine, unspecified: Secondary | ICD-10-CM | POA: Diagnosis not present

## 2023-09-28 DIAGNOSIS — G309 Alzheimer's disease, unspecified: Secondary | ICD-10-CM | POA: Diagnosis not present

## 2023-09-28 DIAGNOSIS — E039 Hypothyroidism, unspecified: Secondary | ICD-10-CM | POA: Diagnosis not present

## 2023-09-28 DIAGNOSIS — I129 Hypertensive chronic kidney disease with stage 1 through stage 4 chronic kidney disease, or unspecified chronic kidney disease: Secondary | ICD-10-CM | POA: Diagnosis not present

## 2023-09-28 DIAGNOSIS — F0283 Dementia in other diseases classified elsewhere, unspecified severity, with mood disturbance: Secondary | ICD-10-CM | POA: Diagnosis not present

## 2023-09-28 DIAGNOSIS — F320 Major depressive disorder, single episode, mild: Secondary | ICD-10-CM | POA: Diagnosis not present

## 2023-09-28 DIAGNOSIS — F0284 Dementia in other diseases classified elsewhere, unspecified severity, with anxiety: Secondary | ICD-10-CM | POA: Diagnosis not present

## 2023-09-28 DIAGNOSIS — S32592D Other specified fracture of left pubis, subsequent encounter for fracture with routine healing: Secondary | ICD-10-CM | POA: Diagnosis not present

## 2023-09-29 DIAGNOSIS — F320 Major depressive disorder, single episode, mild: Secondary | ICD-10-CM | POA: Diagnosis not present

## 2023-09-29 DIAGNOSIS — R339 Retention of urine, unspecified: Secondary | ICD-10-CM | POA: Diagnosis not present

## 2023-09-29 DIAGNOSIS — G309 Alzheimer's disease, unspecified: Secondary | ICD-10-CM | POA: Diagnosis not present

## 2023-09-29 DIAGNOSIS — F0283 Dementia in other diseases classified elsewhere, unspecified severity, with mood disturbance: Secondary | ICD-10-CM | POA: Diagnosis not present

## 2023-09-29 DIAGNOSIS — S32592D Other specified fracture of left pubis, subsequent encounter for fracture with routine healing: Secondary | ICD-10-CM | POA: Diagnosis not present

## 2023-09-29 DIAGNOSIS — E039 Hypothyroidism, unspecified: Secondary | ICD-10-CM | POA: Diagnosis not present

## 2023-09-29 DIAGNOSIS — F0284 Dementia in other diseases classified elsewhere, unspecified severity, with anxiety: Secondary | ICD-10-CM | POA: Diagnosis not present

## 2023-09-29 DIAGNOSIS — I129 Hypertensive chronic kidney disease with stage 1 through stage 4 chronic kidney disease, or unspecified chronic kidney disease: Secondary | ICD-10-CM | POA: Diagnosis not present

## 2023-09-29 DIAGNOSIS — Z466 Encounter for fitting and adjustment of urinary device: Secondary | ICD-10-CM | POA: Diagnosis not present

## 2023-10-03 DIAGNOSIS — I129 Hypertensive chronic kidney disease with stage 1 through stage 4 chronic kidney disease, or unspecified chronic kidney disease: Secondary | ICD-10-CM | POA: Diagnosis not present

## 2023-10-03 DIAGNOSIS — Z466 Encounter for fitting and adjustment of urinary device: Secondary | ICD-10-CM | POA: Diagnosis not present

## 2023-10-03 DIAGNOSIS — F0283 Dementia in other diseases classified elsewhere, unspecified severity, with mood disturbance: Secondary | ICD-10-CM | POA: Diagnosis not present

## 2023-10-03 DIAGNOSIS — F320 Major depressive disorder, single episode, mild: Secondary | ICD-10-CM | POA: Diagnosis not present

## 2023-10-03 DIAGNOSIS — F0284 Dementia in other diseases classified elsewhere, unspecified severity, with anxiety: Secondary | ICD-10-CM | POA: Diagnosis not present

## 2023-10-03 DIAGNOSIS — G309 Alzheimer's disease, unspecified: Secondary | ICD-10-CM | POA: Diagnosis not present

## 2023-10-03 DIAGNOSIS — S32592D Other specified fracture of left pubis, subsequent encounter for fracture with routine healing: Secondary | ICD-10-CM | POA: Diagnosis not present

## 2023-10-03 DIAGNOSIS — R339 Retention of urine, unspecified: Secondary | ICD-10-CM | POA: Diagnosis not present

## 2023-10-03 DIAGNOSIS — E039 Hypothyroidism, unspecified: Secondary | ICD-10-CM | POA: Diagnosis not present

## 2023-10-06 DIAGNOSIS — Z466 Encounter for fitting and adjustment of urinary device: Secondary | ICD-10-CM | POA: Diagnosis not present

## 2023-10-06 DIAGNOSIS — E039 Hypothyroidism, unspecified: Secondary | ICD-10-CM | POA: Diagnosis not present

## 2023-10-06 DIAGNOSIS — S32592D Other specified fracture of left pubis, subsequent encounter for fracture with routine healing: Secondary | ICD-10-CM | POA: Diagnosis not present

## 2023-10-06 DIAGNOSIS — I129 Hypertensive chronic kidney disease with stage 1 through stage 4 chronic kidney disease, or unspecified chronic kidney disease: Secondary | ICD-10-CM | POA: Diagnosis not present

## 2023-10-06 DIAGNOSIS — F0283 Dementia in other diseases classified elsewhere, unspecified severity, with mood disturbance: Secondary | ICD-10-CM | POA: Diagnosis not present

## 2023-10-06 DIAGNOSIS — G309 Alzheimer's disease, unspecified: Secondary | ICD-10-CM | POA: Diagnosis not present

## 2023-10-06 DIAGNOSIS — R339 Retention of urine, unspecified: Secondary | ICD-10-CM | POA: Diagnosis not present

## 2023-10-06 DIAGNOSIS — F0284 Dementia in other diseases classified elsewhere, unspecified severity, with anxiety: Secondary | ICD-10-CM | POA: Diagnosis not present

## 2023-10-06 DIAGNOSIS — F320 Major depressive disorder, single episode, mild: Secondary | ICD-10-CM | POA: Diagnosis not present

## 2023-10-10 DIAGNOSIS — R339 Retention of urine, unspecified: Secondary | ICD-10-CM | POA: Diagnosis not present

## 2023-10-10 DIAGNOSIS — F0283 Dementia in other diseases classified elsewhere, unspecified severity, with mood disturbance: Secondary | ICD-10-CM | POA: Diagnosis not present

## 2023-10-10 DIAGNOSIS — F320 Major depressive disorder, single episode, mild: Secondary | ICD-10-CM | POA: Diagnosis not present

## 2023-10-10 DIAGNOSIS — E039 Hypothyroidism, unspecified: Secondary | ICD-10-CM | POA: Diagnosis not present

## 2023-10-10 DIAGNOSIS — Z466 Encounter for fitting and adjustment of urinary device: Secondary | ICD-10-CM | POA: Diagnosis not present

## 2023-10-10 DIAGNOSIS — G309 Alzheimer's disease, unspecified: Secondary | ICD-10-CM | POA: Diagnosis not present

## 2023-10-10 DIAGNOSIS — S32592D Other specified fracture of left pubis, subsequent encounter for fracture with routine healing: Secondary | ICD-10-CM | POA: Diagnosis not present

## 2023-10-10 DIAGNOSIS — F0284 Dementia in other diseases classified elsewhere, unspecified severity, with anxiety: Secondary | ICD-10-CM | POA: Diagnosis not present

## 2023-10-10 DIAGNOSIS — I129 Hypertensive chronic kidney disease with stage 1 through stage 4 chronic kidney disease, or unspecified chronic kidney disease: Secondary | ICD-10-CM | POA: Diagnosis not present

## 2023-10-11 DIAGNOSIS — S32592D Other specified fracture of left pubis, subsequent encounter for fracture with routine healing: Secondary | ICD-10-CM | POA: Diagnosis not present

## 2023-10-11 DIAGNOSIS — E039 Hypothyroidism, unspecified: Secondary | ICD-10-CM | POA: Diagnosis not present

## 2023-10-11 DIAGNOSIS — R339 Retention of urine, unspecified: Secondary | ICD-10-CM | POA: Diagnosis not present

## 2023-10-11 DIAGNOSIS — F320 Major depressive disorder, single episode, mild: Secondary | ICD-10-CM | POA: Diagnosis not present

## 2023-10-11 DIAGNOSIS — Z466 Encounter for fitting and adjustment of urinary device: Secondary | ICD-10-CM | POA: Diagnosis not present

## 2023-10-11 DIAGNOSIS — F0283 Dementia in other diseases classified elsewhere, unspecified severity, with mood disturbance: Secondary | ICD-10-CM | POA: Diagnosis not present

## 2023-10-11 DIAGNOSIS — G309 Alzheimer's disease, unspecified: Secondary | ICD-10-CM | POA: Diagnosis not present

## 2023-10-11 DIAGNOSIS — F0284 Dementia in other diseases classified elsewhere, unspecified severity, with anxiety: Secondary | ICD-10-CM | POA: Diagnosis not present

## 2023-10-11 DIAGNOSIS — I129 Hypertensive chronic kidney disease with stage 1 through stage 4 chronic kidney disease, or unspecified chronic kidney disease: Secondary | ICD-10-CM | POA: Diagnosis not present

## 2023-10-13 DIAGNOSIS — Z466 Encounter for fitting and adjustment of urinary device: Secondary | ICD-10-CM | POA: Diagnosis not present

## 2023-10-13 DIAGNOSIS — F0284 Dementia in other diseases classified elsewhere, unspecified severity, with anxiety: Secondary | ICD-10-CM | POA: Diagnosis not present

## 2023-10-13 DIAGNOSIS — F320 Major depressive disorder, single episode, mild: Secondary | ICD-10-CM | POA: Diagnosis not present

## 2023-10-13 DIAGNOSIS — I129 Hypertensive chronic kidney disease with stage 1 through stage 4 chronic kidney disease, or unspecified chronic kidney disease: Secondary | ICD-10-CM | POA: Diagnosis not present

## 2023-10-13 DIAGNOSIS — E039 Hypothyroidism, unspecified: Secondary | ICD-10-CM | POA: Diagnosis not present

## 2023-10-13 DIAGNOSIS — G309 Alzheimer's disease, unspecified: Secondary | ICD-10-CM | POA: Diagnosis not present

## 2023-10-13 DIAGNOSIS — R339 Retention of urine, unspecified: Secondary | ICD-10-CM | POA: Diagnosis not present

## 2023-10-13 DIAGNOSIS — S32592D Other specified fracture of left pubis, subsequent encounter for fracture with routine healing: Secondary | ICD-10-CM | POA: Diagnosis not present

## 2023-10-13 DIAGNOSIS — F0283 Dementia in other diseases classified elsewhere, unspecified severity, with mood disturbance: Secondary | ICD-10-CM | POA: Diagnosis not present

## 2023-10-14 ENCOUNTER — Other Ambulatory Visit: Payer: Self-pay | Admitting: Neurology

## 2023-10-14 DIAGNOSIS — S329XXD Fracture of unspecified parts of lumbosacral spine and pelvis, subsequent encounter for fracture with routine healing: Secondary | ICD-10-CM | POA: Diagnosis not present

## 2023-10-18 DIAGNOSIS — E039 Hypothyroidism, unspecified: Secondary | ICD-10-CM | POA: Diagnosis not present

## 2023-10-18 DIAGNOSIS — F0283 Dementia in other diseases classified elsewhere, unspecified severity, with mood disturbance: Secondary | ICD-10-CM | POA: Diagnosis not present

## 2023-10-18 DIAGNOSIS — Z466 Encounter for fitting and adjustment of urinary device: Secondary | ICD-10-CM | POA: Diagnosis not present

## 2023-10-18 DIAGNOSIS — F0284 Dementia in other diseases classified elsewhere, unspecified severity, with anxiety: Secondary | ICD-10-CM | POA: Diagnosis not present

## 2023-10-18 DIAGNOSIS — S32592D Other specified fracture of left pubis, subsequent encounter for fracture with routine healing: Secondary | ICD-10-CM | POA: Diagnosis not present

## 2023-10-18 DIAGNOSIS — G309 Alzheimer's disease, unspecified: Secondary | ICD-10-CM | POA: Diagnosis not present

## 2023-10-18 DIAGNOSIS — F320 Major depressive disorder, single episode, mild: Secondary | ICD-10-CM | POA: Diagnosis not present

## 2023-10-18 DIAGNOSIS — R339 Retention of urine, unspecified: Secondary | ICD-10-CM | POA: Diagnosis not present

## 2023-10-18 DIAGNOSIS — I129 Hypertensive chronic kidney disease with stage 1 through stage 4 chronic kidney disease, or unspecified chronic kidney disease: Secondary | ICD-10-CM | POA: Diagnosis not present

## 2023-10-21 DIAGNOSIS — F0284 Dementia in other diseases classified elsewhere, unspecified severity, with anxiety: Secondary | ICD-10-CM | POA: Diagnosis not present

## 2023-10-21 DIAGNOSIS — G309 Alzheimer's disease, unspecified: Secondary | ICD-10-CM | POA: Diagnosis not present

## 2023-10-21 DIAGNOSIS — Z466 Encounter for fitting and adjustment of urinary device: Secondary | ICD-10-CM | POA: Diagnosis not present

## 2023-10-21 DIAGNOSIS — E039 Hypothyroidism, unspecified: Secondary | ICD-10-CM | POA: Diagnosis not present

## 2023-10-21 DIAGNOSIS — F320 Major depressive disorder, single episode, mild: Secondary | ICD-10-CM | POA: Diagnosis not present

## 2023-10-21 DIAGNOSIS — I129 Hypertensive chronic kidney disease with stage 1 through stage 4 chronic kidney disease, or unspecified chronic kidney disease: Secondary | ICD-10-CM | POA: Diagnosis not present

## 2023-10-21 DIAGNOSIS — R339 Retention of urine, unspecified: Secondary | ICD-10-CM | POA: Diagnosis not present

## 2023-10-21 DIAGNOSIS — F0283 Dementia in other diseases classified elsewhere, unspecified severity, with mood disturbance: Secondary | ICD-10-CM | POA: Diagnosis not present

## 2023-10-21 DIAGNOSIS — S32592D Other specified fracture of left pubis, subsequent encounter for fracture with routine healing: Secondary | ICD-10-CM | POA: Diagnosis not present

## 2023-10-25 DIAGNOSIS — F0283 Dementia in other diseases classified elsewhere, unspecified severity, with mood disturbance: Secondary | ICD-10-CM | POA: Diagnosis not present

## 2023-10-25 DIAGNOSIS — G309 Alzheimer's disease, unspecified: Secondary | ICD-10-CM | POA: Diagnosis not present

## 2023-10-25 DIAGNOSIS — E039 Hypothyroidism, unspecified: Secondary | ICD-10-CM | POA: Diagnosis not present

## 2023-10-25 DIAGNOSIS — R339 Retention of urine, unspecified: Secondary | ICD-10-CM | POA: Diagnosis not present

## 2023-10-25 DIAGNOSIS — Z466 Encounter for fitting and adjustment of urinary device: Secondary | ICD-10-CM | POA: Diagnosis not present

## 2023-10-25 DIAGNOSIS — F320 Major depressive disorder, single episode, mild: Secondary | ICD-10-CM | POA: Diagnosis not present

## 2023-10-25 DIAGNOSIS — F0284 Dementia in other diseases classified elsewhere, unspecified severity, with anxiety: Secondary | ICD-10-CM | POA: Diagnosis not present

## 2023-10-25 DIAGNOSIS — S32592D Other specified fracture of left pubis, subsequent encounter for fracture with routine healing: Secondary | ICD-10-CM | POA: Diagnosis not present

## 2023-10-25 DIAGNOSIS — I129 Hypertensive chronic kidney disease with stage 1 through stage 4 chronic kidney disease, or unspecified chronic kidney disease: Secondary | ICD-10-CM | POA: Diagnosis not present

## 2023-10-26 DIAGNOSIS — Z466 Encounter for fitting and adjustment of urinary device: Secondary | ICD-10-CM | POA: Diagnosis not present

## 2023-10-26 DIAGNOSIS — R339 Retention of urine, unspecified: Secondary | ICD-10-CM | POA: Diagnosis not present

## 2023-10-26 DIAGNOSIS — F0283 Dementia in other diseases classified elsewhere, unspecified severity, with mood disturbance: Secondary | ICD-10-CM | POA: Diagnosis not present

## 2023-10-26 DIAGNOSIS — S32592D Other specified fracture of left pubis, subsequent encounter for fracture with routine healing: Secondary | ICD-10-CM | POA: Diagnosis not present

## 2023-10-26 DIAGNOSIS — E039 Hypothyroidism, unspecified: Secondary | ICD-10-CM | POA: Diagnosis not present

## 2023-10-26 DIAGNOSIS — F320 Major depressive disorder, single episode, mild: Secondary | ICD-10-CM | POA: Diagnosis not present

## 2023-10-26 DIAGNOSIS — G309 Alzheimer's disease, unspecified: Secondary | ICD-10-CM | POA: Diagnosis not present

## 2023-10-26 DIAGNOSIS — I129 Hypertensive chronic kidney disease with stage 1 through stage 4 chronic kidney disease, or unspecified chronic kidney disease: Secondary | ICD-10-CM | POA: Diagnosis not present

## 2023-10-26 DIAGNOSIS — F0284 Dementia in other diseases classified elsewhere, unspecified severity, with anxiety: Secondary | ICD-10-CM | POA: Diagnosis not present

## 2023-10-31 ENCOUNTER — Ambulatory Visit: Payer: Medicare PPO | Admitting: Neurology

## 2023-10-31 ENCOUNTER — Other Ambulatory Visit: Payer: Self-pay | Admitting: Neurology

## 2023-10-31 ENCOUNTER — Encounter: Payer: Self-pay | Admitting: Neurology

## 2023-10-31 VITALS — BP 141/78 | HR 62 | Wt 157.4 lb

## 2023-10-31 DIAGNOSIS — R339 Retention of urine, unspecified: Secondary | ICD-10-CM | POA: Diagnosis not present

## 2023-10-31 DIAGNOSIS — F320 Major depressive disorder, single episode, mild: Secondary | ICD-10-CM | POA: Diagnosis not present

## 2023-10-31 DIAGNOSIS — S32592D Other specified fracture of left pubis, subsequent encounter for fracture with routine healing: Secondary | ICD-10-CM | POA: Diagnosis not present

## 2023-10-31 DIAGNOSIS — G309 Alzheimer's disease, unspecified: Secondary | ICD-10-CM | POA: Diagnosis not present

## 2023-10-31 DIAGNOSIS — G301 Alzheimer's disease with late onset: Secondary | ICD-10-CM | POA: Diagnosis not present

## 2023-10-31 DIAGNOSIS — F0284 Dementia in other diseases classified elsewhere, unspecified severity, with anxiety: Secondary | ICD-10-CM | POA: Diagnosis not present

## 2023-10-31 DIAGNOSIS — F0283 Dementia in other diseases classified elsewhere, unspecified severity, with mood disturbance: Secondary | ICD-10-CM | POA: Diagnosis not present

## 2023-10-31 DIAGNOSIS — F02818 Dementia in other diseases classified elsewhere, unspecified severity, with other behavioral disturbance: Secondary | ICD-10-CM | POA: Diagnosis not present

## 2023-10-31 DIAGNOSIS — Z466 Encounter for fitting and adjustment of urinary device: Secondary | ICD-10-CM | POA: Diagnosis not present

## 2023-10-31 DIAGNOSIS — E039 Hypothyroidism, unspecified: Secondary | ICD-10-CM | POA: Diagnosis not present

## 2023-10-31 DIAGNOSIS — I129 Hypertensive chronic kidney disease with stage 1 through stage 4 chronic kidney disease, or unspecified chronic kidney disease: Secondary | ICD-10-CM | POA: Diagnosis not present

## 2023-10-31 MED ORDER — MEMANTINE HCL 5 MG PO TABS: 5.0000 mg | ORAL_TABLET | Freq: Every morning | ORAL | 4 refills | Status: AC

## 2023-10-31 MED ORDER — SERTRALINE HCL 50 MG PO TABS: 50.0000 mg | ORAL_TABLET | Freq: Every day | ORAL | 4 refills | Status: AC

## 2023-10-31 MED ORDER — GABAPENTIN 100 MG PO CAPS: ORAL_CAPSULE | ORAL | 4 refills | Status: AC

## 2023-10-31 MED ORDER — OLANZAPINE 10 MG PO TABS: ORAL_TABLET | ORAL | 4 refills | Status: AC

## 2023-10-31 MED ORDER — TRAZODONE HCL 50 MG PO TABS: 100.0000 mg | ORAL_TABLET | Freq: Every day | ORAL | 4 refills | Status: AC

## 2023-10-31 NOTE — Patient Instructions (Signed)
 Always good to see you. Continue all your medications. Continue close supervision. Follow-up in 1 year, call for any changes.    FALL PRECAUTIONS: Be cautious when walking. Scan the area for obstacles that may increase the risk of trips and falls. When getting up in the mornings, sit up at the edge of the bed for a few minutes before getting out of bed. Consider elevating the bed at the head end to avoid drop of blood pressure when getting up. Walk always in a well-lit room (use night lights in the walls). Avoid area rugs or power cords from appliances in the middle of the walkways. Use a walker or a cane if necessary and consider physical therapy for balance exercise. Get your eyesight checked regularly.   HOME SAFETY: Consider the safety of the kitchen when operating appliances like stoves, microwave oven, and blender. Consider having supervision and share cooking responsibilities until no longer able to participate in those. Accidents with firearms and other hazards in the house should be identified and addressed as well.   ABILITY TO BE LEFT ALONE: If patient is unable to contact 911 operator, consider using LifeLine, or when the need is there, arrange for someone to stay with patients. Smoking is a fire hazard, consider supervision or cessation. Risk of wandering should be assessed by caregiver and if detected at any point, supervision and safe proof recommendations should be instituted.   RECOMMENDATIONS FOR ALL PATIENTS WITH MEMORY PROBLEMS: 1. Continue to exercise (Recommend 30 minutes of walking everyday, or 3 hours every week) 2. Increase social interactions - continue going to [place] and enjoy social gatherings with friends and family 3. Eat healthy, avoid fried foods and eat more fruits and vegetables 4. Maintain adequate blood pressure, blood sugar, and blood cholesterol level. Reducing the risk of stroke and cardiovascular disease also helps promoting better memory. 5. Avoid stressful  situations. Live a simple life and avoid aggravations. Organize your time and prepare for the next day in anticipation. 6. Sleep well, avoid any interruptions of sleep and avoid any distractions in the bedroom that may interfere with adequate sleep quality 7. Avoid sugar, avoid sweets as there is a strong link between excessive sugar intake, diabetes, and cognitive impairment The Mediterranean diet has been shown to help patients reduce the risk of progressive memory disorders and reduces cardiovascular risk. This includes eating fish, eat fruits and green leafy vegetables, nuts like almonds and hazelnuts, walnuts, and also use olive oil. Avoid fast foods and fried foods as much as possible. Avoid sweets and sugar as sugar use has been linked to worsening of memory function.  There is always a concern of gradual progression of memory problems. If this is the case, then we may need to adjust level of care according to patient needs. Support, both to the patient and caregiver, should then be put into place.

## 2023-10-31 NOTE — Progress Notes (Signed)
 NEUROLOGY FOLLOW UP OFFICE NOTE  [name] [id number] 25-Mar-1936  HISTORY OF PRESENT ILLNESS: I had the pleasure of seeing [name] in follow-up in the neurology clinic on 10/31/2023.  The patient was last seen 86 years ago for dementia with behavioral disturbance. She is again accompanied by her granddaughter and caregiver [name] who helps supplement the history today.  Records and images were personally reviewed where available.  Since her last visit, she had a fall on 08/23/23 sustaining a left pubic body fracture, no surgery done. [name] reports she was not being cared for at rehab and brought her home with home PT and OT where she has been doing really good. She has a hospital bed with rails and a bed alarm. [name] reports everything is doing perfect right now. There was a little delirium at rehab. Her sleep schedule back home is really good with 12 hours of sleep. PT reported the walker was creating problems, she ambulates with family assistance. She reports both legs hurt, L>R. She denies any headaches, dizziness, focal numbness/tingling. Mood is good.   She has been stable on Memantine  5mg  daily, Gabapentin  100mg  in AM, 200mg  in PM, olanzapine  10mg  1/2 tab in AM, 1 tab qhs, Sertraline  50mg  daily, and Trazodone  100mg  qhs without side effects. [name] manages medications, finances, meals.    History on Initial Assessment 01/01/2020: This is a pleasant 88 year old right-handed woman with a history of hypertension, hyperlipidemia, prior stroke with no residual deficits, presenting for evaluation of auditory hallucinations and memory loss. She states her memory "comes and goes." She lives with her granddaughter [name] and daughter-in-law [name]. [name] started noticing memory changes the last couple of years where she would be repeating herself. Recently, memory has worsened, she had 2 appointments in one day and did not remember the afternoon appointment. They have had to write down notes more. She  manages her own medications and [name] has not noticed any issues with forgetting medications. She used to live with her husband then he passed away and she lived alone in [place] for 9 months, before she moved in with [place] 2 years ago. She was not missing any bill payments while alone, bills were switched to autopay 2 years ago. She continues to drive around [place] and denies getting lost. She was getting lost in [place], but they feel it was due to being in an unfamiliar place. [name] started noticing auditory hallucinations while she was still living alone, she would say something was not right with the air, or the doors, so [name] moved her. She was saying the she would hear cars at night or someone trying to break in, barricading her door. She would say some boys were drinking beside her condo or she had mice in the condo and put traps out but never caught any. Since moving in with [name] 2 years ago, she continued to have hallucinations, mostly at night initially, saying something was in the bed with her. Hallucinations have progressively worsened the past few months, also occurring in the daytime. She does not remember what she hears at night, [name] would remind her that she tells [name] she hears her granddaughter calling her "[name]" at night or during the day when the granddaughter is not home. She denies any visual hallucinations, however [name] reminds her the other day she thought she saw someone getting in the window, she heard things outside and said they were packing things into delivery trucks. She saw arms lifting things up, loading trucks  up. She saw a shadow come back the other day on the 2nd level window. She would be afraid he would get a hold of the family.  She would be up all night, knocking on their doors telling them about the hallucinations. Melatonin did not help. Family brought her to the ER a week ago, CBC, CMP, urinalysis unremarkable except for anemia. I personally reviewed  head CT without contrast which did not show any acute changes, there was diffuse cerebral and cerebellar atrophy, moderately extensive chronic microvascular disease. They were instructed to give Tylenol  PM to help her sleep at night, she has been taking 2 tabs qhs and has been sleeping all night with this since then. No REM behavior disorder noted. [name] denies any prior psychiatric history. No hygiene concerns, she is able to bathe and dress independently. Her mother and father had Alzheimer's disease. No history of significant head injuries. She has not had any alcohol in a while.   She denies any headaches, dizziness, diplopia, dysarthria/dysphagia, neck/back pain, focal numbness/tingling/weakness, bowel/bladder dysfunction, anosmia, or tremors. Mood comes and goes, "but not bad." Her last fall was in October 2020 when she tripped. She is scheduled for left hip replacement on 01/28/20.   Update 02/04/2020: Since her last visit, she was brought to the ER on 01/04/20 for worsening behaviors with auditory and visual hallucinations about babies crying while accusing family of killing the crying babies. She would run out of the house at all times at night. The police was in their home when she drove off without her phone. An IVC had to be issued so police could get her. She was brought to the ER where bloodwork was unremarkable. UA showed moderate leukocytes, 6-10 WBC, culture negative. UDS negative. She had an MRI brain on 01/05/20 which did not show any acute changes. There was diffuse cerebral atrophy, moderate chronic microvascular disease. She was admitted to [facility] from 7/20 to 8/4. She was started on gabapentin  150mg  every 8 hours (250mg /52mL taking 3 mLs TID), Memantine  5mg  daily, olanzapine  10mg  qhs, Sertraline  50mg  daily, and Trazodone  50mg  qhs. Donepezil  was stopped since she had significant worsening the day she started it. She has had significant improvement since hospital discharge.  No further hallucinations or delusions.  PAST MEDICAL HISTORY: Past Medical History:  Diagnosis Date   Anxiety    Arthritis    Depression    Hypertension    Pre-diabetes    Stroke (HCC)    6 years ago   Thyroid  disease     MEDICATIONS: Current Outpatient Medications on File Prior to Visit  Medication Sig Dispense Refill   Cholecalciferol  (VITAMIN D3) 25 MCG (1000 UT) CAPS Take 1,000 Units by mouth daily.     ELIQUIS  2.5 MG TABS tablet Take 2.5 mg by mouth 2 (two) times daily.     gabapentin  (NEURONTIN ) 100 MG capsule Take 1 cap in AM, 2 caps in PM 270 capsule 3   levothyroxine  (SYNTHROID ) 75 MCG tablet Take 75 mcg by mouth daily before breakfast.     lisinopril  (ZESTRIL ) 5 MG tablet Take 5 mg by mouth daily.     memantine  (NAMENDA ) 5 MG tablet TAKE 1 TABLET EVERY MORNING 90 tablet 0   metoprolol  succinate (TOPROL -XL) 25 MG 24 hr tablet Take 25 mg by mouth daily.     simvastatin  (ZOCOR ) 20 MG tablet Take 20 mg by mouth at bedtime.      traZODone  (DESYREL ) 50 MG tablet TAKE 2 TABLETS AT BEDTIME 180 tablet  0   vitamin B-12 (CYANOCOBALAMIN ) 1000 MCG tablet Take 1,000 mcg by mouth daily.     No current facility-administered medications on file prior to visit.    ALLERGIES: Allergies  Allergen Reactions   Donepezil  Other (See Comments)    Hallucinations and sleep disturbances   Escitalopram  Oxalate Other (See Comments)    Reaction type/severity unknown   Hydrochlorothiazide Other (See Comments)    Reaction type/severity unknown     FAMILY HISTORY: History reviewed. No pertinent family history.  SOCIAL HISTORY: Social History   Socioeconomic History   Marital status: Widowed    Spouse name: Not on file   Number of children: Not on file   Years of education: Not on file   Highest education level: Not on file  Occupational History   Not on file  Tobacco Use   Smoking status: Never   Smokeless tobacco: Never  Vaping Use   Vaping status: Never Used  Substance and  Sexual Activity   Alcohol use: Never   Drug use: Never   Sexual activity: Not on file    Comment: Hysterectomy  Other Topics Concern   Not on file  Social History Narrative   Right Handed   Two Story Home   Drinks Caffeine    Social Drivers of Health   Financial Resource Strain: Not on file  Food Insecurity: Patient Unable To Answer (08/25/2023)   Hunger Vital Sign    Worried About Running Out of Food in the Last Year: Patient unable to answer    Ran Out of Food in the Last Year: Patient unable to answer  Transportation Needs: Patient Unable To Answer (08/25/2023)   PRAPARE - Transportation    Lack of Transportation (Medical): Patient unable to answer    Lack of Transportation (Non-Medical): Patient unable to answer  Physical Activity: Not on file  Stress: Not on file  Social Connections: Unknown (08/25/2023)   Social Connection and Isolation Panel [NHANES]    Frequency of Communication with Friends and Family: Patient unable to answer    Frequency of Social Gatherings with Friends and Family: Patient unable to answer    Attends Religious Services: Not on file    Active Member of Clubs or Organizations: Patient unable to answer    Attends Banker Meetings: Patient unable to answer    Marital Status: Patient unable to answer  Intimate Partner Violence: Patient Unable To Answer (08/25/2023)   Humiliation, Afraid, Rape, and Kick questionnaire    Fear of Current or Ex-Partner: Patient unable to answer    Emotionally Abused: Patient unable to answer    Physically Abused: Patient unable to answer    Sexually Abused: Patient unable to answer     PHYSICAL EXAM: Vitals:   10/31/23 1413  BP: (!) 141/78  Pulse: 62  SpO2: 97%   General: No acute distress Head:  Normocephalic/atraumatic Skin/Extremities: No rash, no edema Neurological Exam: alert and alert. No aphasia or dysarthria. Fund of knowledge is reduced. Attention and concentration are normal.   Cranial nerves:  Pupils equal, round. Extraocular movements intact with no nystagmus. Visual fields full.  No facial asymmetry.  Motor: Bulk and tone normal, muscle strength 5/5 throughout with no pronator drift.   Finger to nose testing intact.  Gait narrow-based and steady, no ataxia. No tremors.    IMPRESSION: This is a pleasant 88 yo RH woman with a history of hypertension, hyperlipidemia, prior stroke with no residual deficits, with likely Alzheimer's disease with behavioral changes. MRI  brain no acute changes, there was diffuse cerebral atrophy, moderate chronic microvascular disease. Symptoms stable on current regimen, refills sent for Olanzapine  10mg  1/2 tab in AM, 1 tab qhs, sertraline  50mg  daily, Trazodone  100mg  qhs, Memantine  5mg  daily, gabapentin  100mg  in AM, 200mg  qhs, refills sent. Continue 24/7 care. Follow-up in 1 year, call for any changes.   Thank you for allowing me to participate in her care.  Please do not hesitate to call for any questions or concerns.    Rayfield Cairo, M.D.   CC: Marleta Simmer, FNP

## 2023-11-02 ENCOUNTER — Ambulatory Visit: Payer: Medicare PPO | Admitting: Neurology

## 2023-11-02 DIAGNOSIS — F0284 Dementia in other diseases classified elsewhere, unspecified severity, with anxiety: Secondary | ICD-10-CM | POA: Diagnosis not present

## 2023-11-02 DIAGNOSIS — F320 Major depressive disorder, single episode, mild: Secondary | ICD-10-CM | POA: Diagnosis not present

## 2023-11-02 DIAGNOSIS — Z466 Encounter for fitting and adjustment of urinary device: Secondary | ICD-10-CM | POA: Diagnosis not present

## 2023-11-02 DIAGNOSIS — S32592D Other specified fracture of left pubis, subsequent encounter for fracture with routine healing: Secondary | ICD-10-CM | POA: Diagnosis not present

## 2023-11-02 DIAGNOSIS — E039 Hypothyroidism, unspecified: Secondary | ICD-10-CM | POA: Diagnosis not present

## 2023-11-02 DIAGNOSIS — R339 Retention of urine, unspecified: Secondary | ICD-10-CM | POA: Diagnosis not present

## 2023-11-02 DIAGNOSIS — G309 Alzheimer's disease, unspecified: Secondary | ICD-10-CM | POA: Diagnosis not present

## 2023-11-02 DIAGNOSIS — F0283 Dementia in other diseases classified elsewhere, unspecified severity, with mood disturbance: Secondary | ICD-10-CM | POA: Diagnosis not present

## 2023-11-02 DIAGNOSIS — I129 Hypertensive chronic kidney disease with stage 1 through stage 4 chronic kidney disease, or unspecified chronic kidney disease: Secondary | ICD-10-CM | POA: Diagnosis not present

## 2023-11-08 DIAGNOSIS — G309 Alzheimer's disease, unspecified: Secondary | ICD-10-CM | POA: Diagnosis not present

## 2023-11-08 DIAGNOSIS — F0283 Dementia in other diseases classified elsewhere, unspecified severity, with mood disturbance: Secondary | ICD-10-CM | POA: Diagnosis not present

## 2023-11-08 DIAGNOSIS — S32592D Other specified fracture of left pubis, subsequent encounter for fracture with routine healing: Secondary | ICD-10-CM | POA: Diagnosis not present

## 2023-11-08 DIAGNOSIS — R339 Retention of urine, unspecified: Secondary | ICD-10-CM | POA: Diagnosis not present

## 2023-11-08 DIAGNOSIS — E039 Hypothyroidism, unspecified: Secondary | ICD-10-CM | POA: Diagnosis not present

## 2023-11-08 DIAGNOSIS — F320 Major depressive disorder, single episode, mild: Secondary | ICD-10-CM | POA: Diagnosis not present

## 2023-11-08 DIAGNOSIS — F0284 Dementia in other diseases classified elsewhere, unspecified severity, with anxiety: Secondary | ICD-10-CM | POA: Diagnosis not present

## 2023-11-08 DIAGNOSIS — I129 Hypertensive chronic kidney disease with stage 1 through stage 4 chronic kidney disease, or unspecified chronic kidney disease: Secondary | ICD-10-CM | POA: Diagnosis not present

## 2023-11-08 DIAGNOSIS — Z466 Encounter for fitting and adjustment of urinary device: Secondary | ICD-10-CM | POA: Diagnosis not present

## 2023-11-10 DIAGNOSIS — G309 Alzheimer's disease, unspecified: Secondary | ICD-10-CM | POA: Diagnosis not present

## 2023-11-10 DIAGNOSIS — F0284 Dementia in other diseases classified elsewhere, unspecified severity, with anxiety: Secondary | ICD-10-CM | POA: Diagnosis not present

## 2023-11-10 DIAGNOSIS — I129 Hypertensive chronic kidney disease with stage 1 through stage 4 chronic kidney disease, or unspecified chronic kidney disease: Secondary | ICD-10-CM | POA: Diagnosis not present

## 2023-11-10 DIAGNOSIS — F320 Major depressive disorder, single episode, mild: Secondary | ICD-10-CM | POA: Diagnosis not present

## 2023-11-10 DIAGNOSIS — S32592D Other specified fracture of left pubis, subsequent encounter for fracture with routine healing: Secondary | ICD-10-CM | POA: Diagnosis not present

## 2023-11-10 DIAGNOSIS — N1831 Chronic kidney disease, stage 3a: Secondary | ICD-10-CM | POA: Diagnosis not present

## 2023-11-10 DIAGNOSIS — R339 Retention of urine, unspecified: Secondary | ICD-10-CM | POA: Diagnosis not present

## 2023-11-10 DIAGNOSIS — E039 Hypothyroidism, unspecified: Secondary | ICD-10-CM | POA: Diagnosis not present

## 2023-11-10 DIAGNOSIS — F0283 Dementia in other diseases classified elsewhere, unspecified severity, with mood disturbance: Secondary | ICD-10-CM | POA: Diagnosis not present

## 2023-11-13 DIAGNOSIS — S329XXD Fracture of unspecified parts of lumbosacral spine and pelvis, subsequent encounter for fracture with routine healing: Secondary | ICD-10-CM | POA: Diagnosis not present

## 2023-11-16 DIAGNOSIS — F0284 Dementia in other diseases classified elsewhere, unspecified severity, with anxiety: Secondary | ICD-10-CM | POA: Diagnosis not present

## 2023-11-16 DIAGNOSIS — N1831 Chronic kidney disease, stage 3a: Secondary | ICD-10-CM | POA: Diagnosis not present

## 2023-11-16 DIAGNOSIS — F320 Major depressive disorder, single episode, mild: Secondary | ICD-10-CM | POA: Diagnosis not present

## 2023-11-16 DIAGNOSIS — E039 Hypothyroidism, unspecified: Secondary | ICD-10-CM | POA: Diagnosis not present

## 2023-11-16 DIAGNOSIS — R339 Retention of urine, unspecified: Secondary | ICD-10-CM | POA: Diagnosis not present

## 2023-11-16 DIAGNOSIS — S32592D Other specified fracture of left pubis, subsequent encounter for fracture with routine healing: Secondary | ICD-10-CM | POA: Diagnosis not present

## 2023-11-16 DIAGNOSIS — I129 Hypertensive chronic kidney disease with stage 1 through stage 4 chronic kidney disease, or unspecified chronic kidney disease: Secondary | ICD-10-CM | POA: Diagnosis not present

## 2023-11-16 DIAGNOSIS — G309 Alzheimer's disease, unspecified: Secondary | ICD-10-CM | POA: Diagnosis not present

## 2023-11-16 DIAGNOSIS — F0283 Dementia in other diseases classified elsewhere, unspecified severity, with mood disturbance: Secondary | ICD-10-CM | POA: Diagnosis not present

## 2023-11-21 DIAGNOSIS — E039 Hypothyroidism, unspecified: Secondary | ICD-10-CM | POA: Diagnosis not present

## 2023-11-21 DIAGNOSIS — F0283 Dementia in other diseases classified elsewhere, unspecified severity, with mood disturbance: Secondary | ICD-10-CM | POA: Diagnosis not present

## 2023-11-21 DIAGNOSIS — I129 Hypertensive chronic kidney disease with stage 1 through stage 4 chronic kidney disease, or unspecified chronic kidney disease: Secondary | ICD-10-CM | POA: Diagnosis not present

## 2023-11-21 DIAGNOSIS — N1831 Chronic kidney disease, stage 3a: Secondary | ICD-10-CM | POA: Diagnosis not present

## 2023-11-21 DIAGNOSIS — F0284 Dementia in other diseases classified elsewhere, unspecified severity, with anxiety: Secondary | ICD-10-CM | POA: Diagnosis not present

## 2023-11-21 DIAGNOSIS — F320 Major depressive disorder, single episode, mild: Secondary | ICD-10-CM | POA: Diagnosis not present

## 2023-11-21 DIAGNOSIS — G309 Alzheimer's disease, unspecified: Secondary | ICD-10-CM | POA: Diagnosis not present

## 2023-11-21 DIAGNOSIS — S32592D Other specified fracture of left pubis, subsequent encounter for fracture with routine healing: Secondary | ICD-10-CM | POA: Diagnosis not present

## 2023-11-21 DIAGNOSIS — R339 Retention of urine, unspecified: Secondary | ICD-10-CM | POA: Diagnosis not present

## 2023-11-24 DIAGNOSIS — N1831 Chronic kidney disease, stage 3a: Secondary | ICD-10-CM | POA: Diagnosis not present

## 2023-11-24 DIAGNOSIS — R339 Retention of urine, unspecified: Secondary | ICD-10-CM | POA: Diagnosis not present

## 2023-11-24 DIAGNOSIS — F0283 Dementia in other diseases classified elsewhere, unspecified severity, with mood disturbance: Secondary | ICD-10-CM | POA: Diagnosis not present

## 2023-11-24 DIAGNOSIS — F0284 Dementia in other diseases classified elsewhere, unspecified severity, with anxiety: Secondary | ICD-10-CM | POA: Diagnosis not present

## 2023-11-24 DIAGNOSIS — S32592D Other specified fracture of left pubis, subsequent encounter for fracture with routine healing: Secondary | ICD-10-CM | POA: Diagnosis not present

## 2023-11-24 DIAGNOSIS — I129 Hypertensive chronic kidney disease with stage 1 through stage 4 chronic kidney disease, or unspecified chronic kidney disease: Secondary | ICD-10-CM | POA: Diagnosis not present

## 2023-11-24 DIAGNOSIS — F320 Major depressive disorder, single episode, mild: Secondary | ICD-10-CM | POA: Diagnosis not present

## 2023-11-24 DIAGNOSIS — E039 Hypothyroidism, unspecified: Secondary | ICD-10-CM | POA: Diagnosis not present

## 2023-11-24 DIAGNOSIS — G309 Alzheimer's disease, unspecified: Secondary | ICD-10-CM | POA: Diagnosis not present

## 2023-11-28 DIAGNOSIS — F320 Major depressive disorder, single episode, mild: Secondary | ICD-10-CM | POA: Diagnosis not present

## 2023-11-28 DIAGNOSIS — S32592D Other specified fracture of left pubis, subsequent encounter for fracture with routine healing: Secondary | ICD-10-CM | POA: Diagnosis not present

## 2023-11-28 DIAGNOSIS — N1831 Chronic kidney disease, stage 3a: Secondary | ICD-10-CM | POA: Diagnosis not present

## 2023-11-28 DIAGNOSIS — F0284 Dementia in other diseases classified elsewhere, unspecified severity, with anxiety: Secondary | ICD-10-CM | POA: Diagnosis not present

## 2023-11-28 DIAGNOSIS — I129 Hypertensive chronic kidney disease with stage 1 through stage 4 chronic kidney disease, or unspecified chronic kidney disease: Secondary | ICD-10-CM | POA: Diagnosis not present

## 2023-11-28 DIAGNOSIS — F0283 Dementia in other diseases classified elsewhere, unspecified severity, with mood disturbance: Secondary | ICD-10-CM | POA: Diagnosis not present

## 2023-11-28 DIAGNOSIS — G309 Alzheimer's disease, unspecified: Secondary | ICD-10-CM | POA: Diagnosis not present

## 2023-11-28 DIAGNOSIS — E039 Hypothyroidism, unspecified: Secondary | ICD-10-CM | POA: Diagnosis not present

## 2023-11-28 DIAGNOSIS — R339 Retention of urine, unspecified: Secondary | ICD-10-CM | POA: Diagnosis not present

## 2023-12-01 DIAGNOSIS — S32592D Other specified fracture of left pubis, subsequent encounter for fracture with routine healing: Secondary | ICD-10-CM | POA: Diagnosis not present

## 2023-12-01 DIAGNOSIS — F0283 Dementia in other diseases classified elsewhere, unspecified severity, with mood disturbance: Secondary | ICD-10-CM | POA: Diagnosis not present

## 2023-12-01 DIAGNOSIS — R339 Retention of urine, unspecified: Secondary | ICD-10-CM | POA: Diagnosis not present

## 2023-12-01 DIAGNOSIS — F320 Major depressive disorder, single episode, mild: Secondary | ICD-10-CM | POA: Diagnosis not present

## 2023-12-01 DIAGNOSIS — G309 Alzheimer's disease, unspecified: Secondary | ICD-10-CM | POA: Diagnosis not present

## 2023-12-01 DIAGNOSIS — N1831 Chronic kidney disease, stage 3a: Secondary | ICD-10-CM | POA: Diagnosis not present

## 2023-12-01 DIAGNOSIS — E039 Hypothyroidism, unspecified: Secondary | ICD-10-CM | POA: Diagnosis not present

## 2023-12-01 DIAGNOSIS — F0284 Dementia in other diseases classified elsewhere, unspecified severity, with anxiety: Secondary | ICD-10-CM | POA: Diagnosis not present

## 2023-12-01 DIAGNOSIS — I129 Hypertensive chronic kidney disease with stage 1 through stage 4 chronic kidney disease, or unspecified chronic kidney disease: Secondary | ICD-10-CM | POA: Diagnosis not present

## 2023-12-06 DIAGNOSIS — N1831 Chronic kidney disease, stage 3a: Secondary | ICD-10-CM | POA: Diagnosis not present

## 2023-12-06 DIAGNOSIS — E039 Hypothyroidism, unspecified: Secondary | ICD-10-CM | POA: Diagnosis not present

## 2023-12-06 DIAGNOSIS — F320 Major depressive disorder, single episode, mild: Secondary | ICD-10-CM | POA: Diagnosis not present

## 2023-12-06 DIAGNOSIS — F0283 Dementia in other diseases classified elsewhere, unspecified severity, with mood disturbance: Secondary | ICD-10-CM | POA: Diagnosis not present

## 2023-12-06 DIAGNOSIS — I129 Hypertensive chronic kidney disease with stage 1 through stage 4 chronic kidney disease, or unspecified chronic kidney disease: Secondary | ICD-10-CM | POA: Diagnosis not present

## 2023-12-06 DIAGNOSIS — R339 Retention of urine, unspecified: Secondary | ICD-10-CM | POA: Diagnosis not present

## 2023-12-06 DIAGNOSIS — F0284 Dementia in other diseases classified elsewhere, unspecified severity, with anxiety: Secondary | ICD-10-CM | POA: Diagnosis not present

## 2023-12-06 DIAGNOSIS — S32592D Other specified fracture of left pubis, subsequent encounter for fracture with routine healing: Secondary | ICD-10-CM | POA: Diagnosis not present

## 2023-12-06 DIAGNOSIS — G309 Alzheimer's disease, unspecified: Secondary | ICD-10-CM | POA: Diagnosis not present

## 2023-12-07 DIAGNOSIS — F320 Major depressive disorder, single episode, mild: Secondary | ICD-10-CM | POA: Diagnosis not present

## 2023-12-07 DIAGNOSIS — N1831 Chronic kidney disease, stage 3a: Secondary | ICD-10-CM | POA: Diagnosis not present

## 2023-12-07 DIAGNOSIS — R339 Retention of urine, unspecified: Secondary | ICD-10-CM | POA: Diagnosis not present

## 2023-12-07 DIAGNOSIS — F0284 Dementia in other diseases classified elsewhere, unspecified severity, with anxiety: Secondary | ICD-10-CM | POA: Diagnosis not present

## 2023-12-07 DIAGNOSIS — F0283 Dementia in other diseases classified elsewhere, unspecified severity, with mood disturbance: Secondary | ICD-10-CM | POA: Diagnosis not present

## 2023-12-07 DIAGNOSIS — E039 Hypothyroidism, unspecified: Secondary | ICD-10-CM | POA: Diagnosis not present

## 2023-12-07 DIAGNOSIS — S32592D Other specified fracture of left pubis, subsequent encounter for fracture with routine healing: Secondary | ICD-10-CM | POA: Diagnosis not present

## 2023-12-07 DIAGNOSIS — I129 Hypertensive chronic kidney disease with stage 1 through stage 4 chronic kidney disease, or unspecified chronic kidney disease: Secondary | ICD-10-CM | POA: Diagnosis not present

## 2023-12-07 DIAGNOSIS — G309 Alzheimer's disease, unspecified: Secondary | ICD-10-CM | POA: Diagnosis not present

## 2023-12-12 DIAGNOSIS — F0283 Dementia in other diseases classified elsewhere, unspecified severity, with mood disturbance: Secondary | ICD-10-CM | POA: Diagnosis not present

## 2023-12-12 DIAGNOSIS — S32592D Other specified fracture of left pubis, subsequent encounter for fracture with routine healing: Secondary | ICD-10-CM | POA: Diagnosis not present

## 2023-12-12 DIAGNOSIS — R339 Retention of urine, unspecified: Secondary | ICD-10-CM | POA: Diagnosis not present

## 2023-12-12 DIAGNOSIS — I129 Hypertensive chronic kidney disease with stage 1 through stage 4 chronic kidney disease, or unspecified chronic kidney disease: Secondary | ICD-10-CM | POA: Diagnosis not present

## 2023-12-12 DIAGNOSIS — N1831 Chronic kidney disease, stage 3a: Secondary | ICD-10-CM | POA: Diagnosis not present

## 2023-12-12 DIAGNOSIS — E039 Hypothyroidism, unspecified: Secondary | ICD-10-CM | POA: Diagnosis not present

## 2023-12-12 DIAGNOSIS — G309 Alzheimer's disease, unspecified: Secondary | ICD-10-CM | POA: Diagnosis not present

## 2023-12-12 DIAGNOSIS — F0284 Dementia in other diseases classified elsewhere, unspecified severity, with anxiety: Secondary | ICD-10-CM | POA: Diagnosis not present

## 2023-12-12 DIAGNOSIS — F320 Major depressive disorder, single episode, mild: Secondary | ICD-10-CM | POA: Diagnosis not present

## 2023-12-14 DIAGNOSIS — F0283 Dementia in other diseases classified elsewhere, unspecified severity, with mood disturbance: Secondary | ICD-10-CM | POA: Diagnosis not present

## 2023-12-14 DIAGNOSIS — R339 Retention of urine, unspecified: Secondary | ICD-10-CM | POA: Diagnosis not present

## 2023-12-14 DIAGNOSIS — F320 Major depressive disorder, single episode, mild: Secondary | ICD-10-CM | POA: Diagnosis not present

## 2023-12-14 DIAGNOSIS — N1831 Chronic kidney disease, stage 3a: Secondary | ICD-10-CM | POA: Diagnosis not present

## 2023-12-14 DIAGNOSIS — G309 Alzheimer's disease, unspecified: Secondary | ICD-10-CM | POA: Diagnosis not present

## 2023-12-14 DIAGNOSIS — F0284 Dementia in other diseases classified elsewhere, unspecified severity, with anxiety: Secondary | ICD-10-CM | POA: Diagnosis not present

## 2023-12-14 DIAGNOSIS — E039 Hypothyroidism, unspecified: Secondary | ICD-10-CM | POA: Diagnosis not present

## 2023-12-14 DIAGNOSIS — I129 Hypertensive chronic kidney disease with stage 1 through stage 4 chronic kidney disease, or unspecified chronic kidney disease: Secondary | ICD-10-CM | POA: Diagnosis not present

## 2023-12-14 DIAGNOSIS — S32592D Other specified fracture of left pubis, subsequent encounter for fracture with routine healing: Secondary | ICD-10-CM | POA: Diagnosis not present

## 2023-12-14 DIAGNOSIS — S329XXD Fracture of unspecified parts of lumbosacral spine and pelvis, subsequent encounter for fracture with routine healing: Secondary | ICD-10-CM | POA: Diagnosis not present

## 2023-12-19 DIAGNOSIS — R339 Retention of urine, unspecified: Secondary | ICD-10-CM | POA: Diagnosis not present

## 2023-12-19 DIAGNOSIS — F0283 Dementia in other diseases classified elsewhere, unspecified severity, with mood disturbance: Secondary | ICD-10-CM | POA: Diagnosis not present

## 2023-12-19 DIAGNOSIS — I129 Hypertensive chronic kidney disease with stage 1 through stage 4 chronic kidney disease, or unspecified chronic kidney disease: Secondary | ICD-10-CM | POA: Diagnosis not present

## 2023-12-19 DIAGNOSIS — F0284 Dementia in other diseases classified elsewhere, unspecified severity, with anxiety: Secondary | ICD-10-CM | POA: Diagnosis not present

## 2023-12-19 DIAGNOSIS — N1831 Chronic kidney disease, stage 3a: Secondary | ICD-10-CM | POA: Diagnosis not present

## 2023-12-19 DIAGNOSIS — G309 Alzheimer's disease, unspecified: Secondary | ICD-10-CM | POA: Diagnosis not present

## 2023-12-19 DIAGNOSIS — F320 Major depressive disorder, single episode, mild: Secondary | ICD-10-CM | POA: Diagnosis not present

## 2023-12-19 DIAGNOSIS — E039 Hypothyroidism, unspecified: Secondary | ICD-10-CM | POA: Diagnosis not present

## 2023-12-19 DIAGNOSIS — S32592D Other specified fracture of left pubis, subsequent encounter for fracture with routine healing: Secondary | ICD-10-CM | POA: Diagnosis not present

## 2023-12-20 DIAGNOSIS — I129 Hypertensive chronic kidney disease with stage 1 through stage 4 chronic kidney disease, or unspecified chronic kidney disease: Secondary | ICD-10-CM | POA: Diagnosis not present

## 2023-12-20 DIAGNOSIS — N1831 Chronic kidney disease, stage 3a: Secondary | ICD-10-CM | POA: Diagnosis not present

## 2023-12-20 DIAGNOSIS — R339 Retention of urine, unspecified: Secondary | ICD-10-CM | POA: Diagnosis not present

## 2023-12-20 DIAGNOSIS — S32592D Other specified fracture of left pubis, subsequent encounter for fracture with routine healing: Secondary | ICD-10-CM | POA: Diagnosis not present

## 2023-12-20 DIAGNOSIS — F320 Major depressive disorder, single episode, mild: Secondary | ICD-10-CM | POA: Diagnosis not present

## 2023-12-20 DIAGNOSIS — F0284 Dementia in other diseases classified elsewhere, unspecified severity, with anxiety: Secondary | ICD-10-CM | POA: Diagnosis not present

## 2023-12-20 DIAGNOSIS — F0283 Dementia in other diseases classified elsewhere, unspecified severity, with mood disturbance: Secondary | ICD-10-CM | POA: Diagnosis not present

## 2023-12-20 DIAGNOSIS — G309 Alzheimer's disease, unspecified: Secondary | ICD-10-CM | POA: Diagnosis not present

## 2023-12-20 DIAGNOSIS — E039 Hypothyroidism, unspecified: Secondary | ICD-10-CM | POA: Diagnosis not present

## 2023-12-26 DIAGNOSIS — G309 Alzheimer's disease, unspecified: Secondary | ICD-10-CM | POA: Diagnosis not present

## 2023-12-26 DIAGNOSIS — N1831 Chronic kidney disease, stage 3a: Secondary | ICD-10-CM | POA: Diagnosis not present

## 2023-12-26 DIAGNOSIS — F0283 Dementia in other diseases classified elsewhere, unspecified severity, with mood disturbance: Secondary | ICD-10-CM | POA: Diagnosis not present

## 2023-12-26 DIAGNOSIS — S32592D Other specified fracture of left pubis, subsequent encounter for fracture with routine healing: Secondary | ICD-10-CM | POA: Diagnosis not present

## 2023-12-26 DIAGNOSIS — I129 Hypertensive chronic kidney disease with stage 1 through stage 4 chronic kidney disease, or unspecified chronic kidney disease: Secondary | ICD-10-CM | POA: Diagnosis not present

## 2023-12-26 DIAGNOSIS — R339 Retention of urine, unspecified: Secondary | ICD-10-CM | POA: Diagnosis not present

## 2023-12-26 DIAGNOSIS — F0284 Dementia in other diseases classified elsewhere, unspecified severity, with anxiety: Secondary | ICD-10-CM | POA: Diagnosis not present

## 2023-12-26 DIAGNOSIS — E039 Hypothyroidism, unspecified: Secondary | ICD-10-CM | POA: Diagnosis not present

## 2023-12-26 DIAGNOSIS — F320 Major depressive disorder, single episode, mild: Secondary | ICD-10-CM | POA: Diagnosis not present

## 2023-12-27 DIAGNOSIS — F320 Major depressive disorder, single episode, mild: Secondary | ICD-10-CM | POA: Diagnosis not present

## 2023-12-27 DIAGNOSIS — E559 Vitamin D deficiency, unspecified: Secondary | ICD-10-CM | POA: Diagnosis not present

## 2023-12-27 DIAGNOSIS — Z8781 Personal history of (healed) traumatic fracture: Secondary | ICD-10-CM | POA: Diagnosis not present

## 2023-12-27 DIAGNOSIS — E782 Mixed hyperlipidemia: Secondary | ICD-10-CM | POA: Diagnosis not present

## 2023-12-27 DIAGNOSIS — E039 Hypothyroidism, unspecified: Secondary | ICD-10-CM | POA: Diagnosis not present

## 2023-12-27 DIAGNOSIS — D631 Anemia in chronic kidney disease: Secondary | ICD-10-CM | POA: Diagnosis not present

## 2023-12-27 DIAGNOSIS — N1831 Chronic kidney disease, stage 3a: Secondary | ICD-10-CM | POA: Diagnosis not present

## 2023-12-27 DIAGNOSIS — G309 Alzheimer's disease, unspecified: Secondary | ICD-10-CM | POA: Diagnosis not present

## 2023-12-27 DIAGNOSIS — I129 Hypertensive chronic kidney disease with stage 1 through stage 4 chronic kidney disease, or unspecified chronic kidney disease: Secondary | ICD-10-CM | POA: Diagnosis not present

## 2024-01-04 DIAGNOSIS — F0284 Dementia in other diseases classified elsewhere, unspecified severity, with anxiety: Secondary | ICD-10-CM | POA: Diagnosis not present

## 2024-01-04 DIAGNOSIS — F0283 Dementia in other diseases classified elsewhere, unspecified severity, with mood disturbance: Secondary | ICD-10-CM | POA: Diagnosis not present

## 2024-01-04 DIAGNOSIS — I129 Hypertensive chronic kidney disease with stage 1 through stage 4 chronic kidney disease, or unspecified chronic kidney disease: Secondary | ICD-10-CM | POA: Diagnosis not present

## 2024-01-04 DIAGNOSIS — E039 Hypothyroidism, unspecified: Secondary | ICD-10-CM | POA: Diagnosis not present

## 2024-01-04 DIAGNOSIS — S32592D Other specified fracture of left pubis, subsequent encounter for fracture with routine healing: Secondary | ICD-10-CM | POA: Diagnosis not present

## 2024-01-04 DIAGNOSIS — R339 Retention of urine, unspecified: Secondary | ICD-10-CM | POA: Diagnosis not present

## 2024-01-04 DIAGNOSIS — F320 Major depressive disorder, single episode, mild: Secondary | ICD-10-CM | POA: Diagnosis not present

## 2024-01-04 DIAGNOSIS — G309 Alzheimer's disease, unspecified: Secondary | ICD-10-CM | POA: Diagnosis not present

## 2024-01-04 DIAGNOSIS — N1831 Chronic kidney disease, stage 3a: Secondary | ICD-10-CM | POA: Diagnosis not present

## 2024-01-13 DIAGNOSIS — S329XXD Fracture of unspecified parts of lumbosacral spine and pelvis, subsequent encounter for fracture with routine healing: Secondary | ICD-10-CM | POA: Diagnosis not present

## 2024-01-25 DIAGNOSIS — R8290 Unspecified abnormal findings in urine: Secondary | ICD-10-CM | POA: Diagnosis not present

## 2024-02-13 DIAGNOSIS — S329XXD Fracture of unspecified parts of lumbosacral spine and pelvis, subsequent encounter for fracture with routine healing: Secondary | ICD-10-CM | POA: Diagnosis not present

## 2024-03-15 DIAGNOSIS — S329XXD Fracture of unspecified parts of lumbosacral spine and pelvis, subsequent encounter for fracture with routine healing: Secondary | ICD-10-CM | POA: Diagnosis not present

## 2024-05-15 DIAGNOSIS — S329XXD Fracture of unspecified parts of lumbosacral spine and pelvis, subsequent encounter for fracture with routine healing: Secondary | ICD-10-CM | POA: Diagnosis not present

## 2024-10-31 ENCOUNTER — Ambulatory Visit: Admitting: Neurology
